# Patient Record
Sex: Male | Born: 1955 | Hispanic: No | Marital: Married | Smoking: Former smoker
Health system: Southern US, Community
[De-identification: ages and names within clinical notes are randomized; demographics above are authoritative.]

## PROBLEM LIST (undated history)

## (undated) DIAGNOSIS — I2510 Atherosclerotic heart disease of native coronary artery without angina pectoris: Secondary | ICD-10-CM

## (undated) DIAGNOSIS — E785 Hyperlipidemia, unspecified: Secondary | ICD-10-CM

## (undated) DIAGNOSIS — Z8616 Personal history of COVID-19: Secondary | ICD-10-CM

## (undated) DIAGNOSIS — R9431 Abnormal electrocardiogram [ECG] [EKG]: Secondary | ICD-10-CM

## (undated) DIAGNOSIS — E119 Type 2 diabetes mellitus without complications: Secondary | ICD-10-CM

## (undated) DIAGNOSIS — I10 Essential (primary) hypertension: Secondary | ICD-10-CM

## (undated) DIAGNOSIS — G4733 Obstructive sleep apnea (adult) (pediatric): Principal | ICD-10-CM

## (undated) HISTORY — DX: Hyperlipidemia, unspecified: E78.5

## (undated) HISTORY — DX: Type 2 diabetes mellitus without complications: E11.9

## (undated) HISTORY — DX: Personal history of COVID-19: Z86.16

## (undated) HISTORY — PX: CORONARY ANGIOPLASTY WITH STENT PLACEMENT: SHX49

## (undated) HISTORY — DX: Obstructive sleep apnea (adult) (pediatric): G47.33

## (undated) HISTORY — DX: Abnormal electrocardiogram (ECG) (EKG): R94.31

## (undated) HISTORY — DX: Atherosclerotic heart disease of native coronary artery without angina pectoris: I25.10

---

## 1995-04-28 HISTORY — PX: LEG SURGERY: SHX1003

## 2006-04-06 ENCOUNTER — Ambulatory Visit (HOSPITAL_COMMUNITY): Admission: RE | Admit: 2006-04-06 | Discharge: 2006-04-06 | Payer: Self-pay | Admitting: Cardiology

## 2007-08-20 ENCOUNTER — Emergency Department (HOSPITAL_COMMUNITY): Admission: EM | Admit: 2007-08-20 | Discharge: 2007-08-20 | Payer: Self-pay | Admitting: Emergency Medicine

## 2010-08-08 ENCOUNTER — Emergency Department (HOSPITAL_COMMUNITY): Payer: 59

## 2010-08-08 ENCOUNTER — Emergency Department (HOSPITAL_COMMUNITY)
Admission: EM | Admit: 2010-08-08 | Discharge: 2010-08-08 | Disposition: A | Discharge status: Home / Self Care | Payer: 59 | Attending: Emergency Medicine | Admitting: Emergency Medicine

## 2010-08-08 DIAGNOSIS — R0602 Shortness of breath: Secondary | ICD-10-CM | POA: Insufficient documentation

## 2010-08-08 DIAGNOSIS — R072 Precordial pain: Secondary | ICD-10-CM | POA: Insufficient documentation

## 2010-08-08 DIAGNOSIS — I517 Cardiomegaly: Secondary | ICD-10-CM | POA: Insufficient documentation

## 2010-08-08 DIAGNOSIS — R05 Cough: Secondary | ICD-10-CM | POA: Insufficient documentation

## 2010-08-08 DIAGNOSIS — R42 Dizziness and giddiness: Secondary | ICD-10-CM | POA: Insufficient documentation

## 2010-08-08 DIAGNOSIS — R059 Cough, unspecified: Secondary | ICD-10-CM | POA: Insufficient documentation

## 2010-08-08 LAB — DIFFERENTIAL
Basophils Absolute: 0 10*3/uL (ref 0.0–0.1)
Basophils Relative: 0 % (ref 0–1)
Eosinophils Absolute: 0.9 10*3/uL — ABNORMAL HIGH (ref 0.0–0.7)
Eosinophils Relative: 8 % — ABNORMAL HIGH (ref 0–5)
Lymphocytes Relative: 40 % (ref 12–46)
Lymphs Abs: 4.2 10*3/uL — ABNORMAL HIGH (ref 0.7–4.0)
Monocytes Absolute: 0.8 10*3/uL (ref 0.1–1.0)
Monocytes Relative: 7 % (ref 3–12)
Neutro Abs: 4.6 10*3/uL (ref 1.7–7.7)
Neutrophils Relative %: 44 % (ref 43–77)

## 2010-08-08 LAB — CBC
HCT: 38.7 % — ABNORMAL LOW (ref 39.0–52.0)
Hemoglobin: 13.4 g/dL (ref 13.0–17.0)
MCH: 27.1 pg (ref 26.0–34.0)
MCHC: 34.6 g/dL (ref 30.0–36.0)
MCV: 78.2 fL (ref 78.0–100.0)
Platelets: 163 10*3/uL (ref 150–400)
RBC: 4.95 MIL/uL (ref 4.22–5.81)
RDW: 13.1 % (ref 11.5–15.5)
WBC: 10.5 10*3/uL (ref 4.0–10.5)

## 2010-08-08 LAB — BASIC METABOLIC PANEL
BUN: 14 mg/dL (ref 6–23)
CO2: 32 mEq/L (ref 19–32)
Calcium: 9 mg/dL (ref 8.4–10.5)
Chloride: 96 mEq/L (ref 96–112)
Creatinine, Ser: 1.2 mg/dL (ref 0.4–1.5)
GFR calc Af Amer: >60 mL/min (ref >60)
GFR calc non Af Amer: >60 mL/min (ref >60)
Glucose, Bld: 132 mg/dL — ABNORMAL HIGH (ref 70–99)
Potassium: 2.3 mEq/L — CL (ref 3.5–5.1)
Sodium: 139 mEq/L (ref 135–145)

## 2010-08-08 LAB — POCT CARDIAC MARKERS
CKMB, poc: 3.3 ng/mL (ref 1.0–8.0)
CKMB, poc: 3.6 ng/mL (ref 1.0–8.0)
Myoglobin, poc: 119 ng/mL (ref 12–200)
Myoglobin, poc: 123 ng/mL (ref 12–200)
Troponin i, poc: <0.05 ng/mL (ref 0.00–0.09)
Troponin i, poc: <0.05 ng/mL (ref 0.00–0.09)

## 2010-08-08 LAB — D-DIMER, QUANTITATIVE: D-Dimer, Quant: 0.23 ug/mL-FEU (ref 0.00–0.48)

## 2010-08-12 ENCOUNTER — Other Ambulatory Visit: Payer: Self-pay | Admitting: Internal Medicine

## 2010-08-12 DIAGNOSIS — I10 Essential (primary) hypertension: Secondary | ICD-10-CM

## 2010-08-14 ENCOUNTER — Other Ambulatory Visit: Payer: Self-pay | Admitting: Internal Medicine

## 2010-08-14 DIAGNOSIS — I10 Essential (primary) hypertension: Secondary | ICD-10-CM

## 2010-08-19 ENCOUNTER — Ambulatory Visit
Admission: RE | Admit: 2010-08-19 | Discharge: 2010-08-19 | Disposition: A | Discharge status: Home / Self Care | Payer: 59 | Source: Ambulatory Visit | Attending: Internal Medicine | Admitting: Internal Medicine

## 2010-08-19 ENCOUNTER — Other Ambulatory Visit: Payer: Self-pay | Admitting: Internal Medicine

## 2010-08-19 DIAGNOSIS — I10 Essential (primary) hypertension: Secondary | ICD-10-CM

## 2010-08-25 ENCOUNTER — Other Ambulatory Visit: Payer: 59

## 2012-04-27 HISTORY — PX: COLONOSCOPY: SHX174

## 2012-08-23 ENCOUNTER — Emergency Department (HOSPITAL_COMMUNITY)
Admission: EM | Admit: 2012-08-23 | Discharge: 2012-08-23 | Disposition: A | Discharge status: Home / Self Care | Payer: BC Managed Care – PPO | Attending: Emergency Medicine | Admitting: Emergency Medicine

## 2012-08-23 ENCOUNTER — Encounter (HOSPITAL_COMMUNITY): Payer: Self-pay | Admitting: Nurse Practitioner

## 2012-08-23 ENCOUNTER — Emergency Department (HOSPITAL_COMMUNITY): Payer: BC Managed Care – PPO

## 2012-08-23 DIAGNOSIS — R059 Cough, unspecified: Secondary | ICD-10-CM

## 2012-08-23 DIAGNOSIS — R079 Chest pain, unspecified: Secondary | ICD-10-CM | POA: Insufficient documentation

## 2012-08-23 DIAGNOSIS — R0602 Shortness of breath: Secondary | ICD-10-CM | POA: Insufficient documentation

## 2012-08-23 DIAGNOSIS — I10 Essential (primary) hypertension: Secondary | ICD-10-CM | POA: Insufficient documentation

## 2012-08-23 DIAGNOSIS — Z79899 Other long term (current) drug therapy: Secondary | ICD-10-CM | POA: Insufficient documentation

## 2012-08-23 DIAGNOSIS — R05 Cough: Secondary | ICD-10-CM | POA: Insufficient documentation

## 2012-08-23 HISTORY — DX: Essential (primary) hypertension: I10

## 2012-08-23 MED ORDER — OMEPRAZOLE 20 MG PO CPDR: 20.0000 mg | DELAYED_RELEASE_CAPSULE | Freq: Every day | ORAL | Status: DC

## 2012-08-23 MED ORDER — ALBUTEROL SULFATE HFA 108 (90 BASE) MCG/ACT IN AERS
2.0000 | INHALATION_SPRAY | RESPIRATORY_TRACT | Status: DC | PRN
  Administered 2012-08-23: 2 via RESPIRATORY_TRACT
  Filled 2012-08-23: qty 6.7

## 2012-08-23 NOTE — ED Provider Notes (Signed)
History    This chart was scribed for non-physician practitioner Elpidio Anis, PA-C working with Dione Booze, MD by Gerlean Ren, ED Scribe. This patient was seen in room TR06C/TR06C and the patient's care was started at 5:51 PM.   CSN: [id number]  Arrival date & time 08/23/12  1509   First MD Initiated Contact with Patient 08/23/12 1741      Chief Complaint  Patient presents with  . Cough     The history is provided by the patient. No language interpreter was used.  Thomas Rowland is a 57 y.o. male who presents to the Emergency Department complaining of 3-4 weeks of constant cough productive of white/brown sputum that is worse at night.  Pt reports associated shortness of breath, and left lateral rib pain only present when coughing.  Pt has several regular medications and denies any changes in medication prior to symptom onset.  Pt has reported this problem to his PCP and has been told the cough is related to HTN.   PCP is Dr. Allyne Gee. Past Medical History  Diagnosis Date  . Hypertension     History reviewed. No pertinent past surgical history.  History reviewed. No pertinent family history.  History  Substance Use Topics  . Smoking status: Never Smoker   . Smokeless tobacco: Not on file  . Alcohol Use: Yes      Review of Systems  Constitutional: Negative for fever.  Respiratory: Positive for cough and shortness of breath. Negative for wheezing.   Cardiovascular: Negative for chest pain.  All other systems reviewed and are negative.    Allergies  Review of patient's allergies indicates no known allergies.  Home Medications   Current Outpatient Rx  Name  Route  Sig  Dispense  Refill  . amLODipine-olmesartan (AZOR) 10-40 MG per tablet   Oral   Take 1 tablet by mouth daily.         . carvedilol (COREG) 25 MG tablet   Oral   Take 37.5 mg by mouth 2 (two) times daily with a meal.         . cloNIDine (CATAPRES) 0.2 MG tablet   Oral   Take 0.2 mg by mouth 2  (two) times daily.         . furosemide (LASIX) 40 MG tablet   Oral   Take 40 mg by mouth daily.         . hydrALAZINE (APRESOLINE) 25 MG tablet   Oral   Take 25 mg by mouth 2 (two) times daily.         . methyldopa (ALDOMET) 250 MG tablet   Oral   Take 250 mg by mouth 2 (two) times daily.         . potassium chloride (KLOR-CON) 20 MEQ packet   Oral   Take 40 mEq by mouth 2 (two) times daily.         . Vitamin D, Ergocalciferol, (DRISDOL) 50000 UNITS CAPS   Oral   Take 50,000 Units by mouth 2 (two) times a week. Tues & Fri           BP 168/90  Pulse 75  Temp(Src) 98.8 F (37.1 C) (Oral)  Resp 18  SpO2 97%  Physical Exam  Nursing note and vitals reviewed. Constitutional: He is oriented to person, place, and time. He appears well-developed and well-nourished. No distress.  HENT:  Head: Normocephalic and atraumatic.  Eyes: EOM are normal.  Neck: Neck supple. No tracheal deviation present.  Cardiovascular:  Normal rate, regular rhythm and normal heart sounds.   Pulmonary/Chest: Effort normal and breath sounds normal. No respiratory distress. He has no wheezes. He has no rales.  Musculoskeletal: Normal range of motion.  Neurological: He is alert and oriented to person, place, and time.  Skin: Skin is warm and dry.  Psychiatric: He has a normal mood and affect. His behavior is normal.    ED Course  Procedures (including critical care time) DIAGNOSTIC STUDIES: Oxygen Saturation is 97% on room air, adequate by my interpretation.    COORDINATION OF CARE: 5:57 PM- Informed pt that it is not unusual for HTN medication to cause a cough.  Informed pt that because XR does not show pneumonia and because pt has not had any further symptoms this cough may very well be related to HTN medication.  Informed pt I will further assess medical records.  Pt verbalizes understanding and agrees with plan.     Dg Chest 2 View  08/23/2012  *RADIOLOGY REPORT*  Clinical Data:  Chronic cough  CHEST - 2 VIEW  Comparison: 08/08/2010 and 08/20/2007 chest radiographs  Findings: Mild cardiomegaly is noted. Elevation of the right hemidiaphragm is unchanged. There is no evidence of focal airspace disease, pulmonary edema, suspicious pulmonary nodule/mass, pleural effusion, or pneumothorax. No acute bony abnormalities are identified.  IMPRESSION: Cardiomegaly without evidence of acute cardiopulmonary disease.   Original Report Authenticated By: Harmon Pier, M.D.      No diagnosis found. 1. Cough    MDM  Patient's medications reviewed and none found to be implicated as cause of cough. Patient given an Albuterol inhaler as well as recommendation for Prilosec, and encouraged to follow up with Dr. Allyne Gee.      I personally performed the services described in this documentation, which was scribed in my presence. The recorded information has been reviewed and is accurate.    Arnoldo Hooker, PA-C 08/23/12 1817

## 2012-08-23 NOTE — ED Notes (Signed)
Pt reports productive cough and nasal congestion for past 3 weeks. Denies fevers. A&Ox4, able to speak in full unlabored sentences.

## 2012-08-23 NOTE — ED Provider Notes (Signed)
Medical screening examination/treatment/procedure(s) were performed by non-physician practitioner and as supervising physician I was immediately available for consultation/collaboration.   Dione Booze, MD 08/23/12 2152

## 2013-02-03 LAB — HM COLONOSCOPY

## 2013-06-26 ENCOUNTER — Encounter: Payer: Self-pay | Admitting: Neurology

## 2013-06-26 ENCOUNTER — Encounter (INDEPENDENT_AMBULATORY_CARE_PROVIDER_SITE_OTHER): Payer: Self-pay

## 2013-06-26 ENCOUNTER — Ambulatory Visit (INDEPENDENT_AMBULATORY_CARE_PROVIDER_SITE_OTHER): Payer: No Typology Code available for payment source | Admitting: Neurology

## 2013-06-26 VITALS — BP 151/103 | HR 85 | Temp 98.3°F | Ht 67.0 in | Wt 188.0 lb

## 2013-06-26 DIAGNOSIS — R9431 Abnormal electrocardiogram [ECG] [EKG]: Secondary | ICD-10-CM

## 2013-06-26 DIAGNOSIS — I10 Essential (primary) hypertension: Secondary | ICD-10-CM | POA: Insufficient documentation

## 2013-06-26 DIAGNOSIS — G4733 Obstructive sleep apnea (adult) (pediatric): Secondary | ICD-10-CM | POA: Insufficient documentation

## 2013-06-26 HISTORY — DX: Abnormal electrocardiogram (ECG) (EKG): R94.31

## 2013-06-26 NOTE — Patient Instructions (Signed)

## 2013-06-26 NOTE — Progress Notes (Signed)
Subjective:    Patient ID: Thomas Rowland is a 58 y.o. male.  HPI    [name], MD, PhD [facility] [address] [address] [place], [place] [address]  Dear Thomas Rowland,   I saw your patient, Thomas Rowland, upon your kind request in my neurologic clinic today for initial consultation of his sleep disorder, in particular, concern for obstructive sleep apnea. The patient is accompanied by his daughter, Thomas Rowland, today, who also helps with some of the translation, although he speaks [place] well enough. As you know, Thomas Rowland is a very pleasant 58 year old right-handed gentleman with an underlying medical history of difficult to control hypertension, abnormal EKG, and chronic renal insufficiency, who reports loud snoring, daytime somnolence and daytime lack of energy. However, his ESS is 1/24 today. He cannot readily go back to sleep even when feeling sleepy or tired at night. He achieves about 4 hours of sleep per night. He works from 3 to 11 PM; he works at [employer] and [employer].   His typical bedtime is reported to be around 2 AM and usual wake time is around 7 AM. Sleep onset typically occurs within a few minutes. He reports feeling poorly rested upon awakening. He wakes up on an average 3 times in the middle of the night and has to go to the bathroom at least 2 times on a typical night. He denies morning headaches. The patient has been taking a planned nap, which is usually 1 hour long, sometime from 11 AM to 12. He reports feeling refreshed after a nap.  He has been known to snore for the past many years. Snoring is reportedly marked, and associated with choking sounds and witnessed apneas. The patient denies a sense of choking or strangling feeling. There is no report of nighttime reflux, with frequent nighttime cough experienced. The patient has not noted any RLS symptoms and is not known to kick while asleep or before falling asleep. There is no family history of  RLS or OSA.   He is not a very restless sleeper.   He denies cataplexy, sleep paralysis, hypnagogic or hypnopompic hallucinations, or sleep attacks. He does not report any vivid dreams, nightmares, dream enactments, or parasomnias, such as sleep talking or sleep walking. The patient has not had a sleep study or a home sleep test.  He consumes 1 caffeinated beverages per day, usually in the form of tea.  His bedroom is usually dark and cool. There is TV in the bedroom and usually it is not on at night.   His Past Medical History Is Significant For: Past Medical History  Diagnosis Date  . Hypertension   . OSA (obstructive sleep apnea) 06/26/2013  . Nonspecific abnormal electrocardiogram (ECG) (EKG) 06/26/2013    His Past Surgical History Is Significant For: Past Surgical History  Procedure Laterality Date  . [place] surgery Left 1997    [place]  . Colonoscopy  2014    Normal    His Family History Is Significant For: Family History  Problem Relation Age of Onset  . Hypertension Sister     His Social History Is Significant For: History   Social History  . Marital Status: Married    Spouse Name: N/A    Number of Children: 3  . Years of Education: N/A   Occupational History  .     Social History Main Topics  . Smoking status: Former Smoker    Quit date: 06/26/2001  . Smokeless tobacco: None  .  Alcohol Use: Yes  . Drug Use: No  . Sexual Activity: None   Other Topics Concern  . None   Social History Narrative  . None    His Allergies Are:  No Known Allergies:   His Current Medications Are:  Outpatient Encounter Prescriptions as of 06/26/2013  Medication Sig  . amLODipine (NORVASC) 5 MG tablet Take 2 tablets by mouth daily.  . chlorthalidone (HYGROTON) 25 MG tablet Take 1 tablet by mouth daily.  . dorzolamide-timolol (COSOPT) 22.3-6.8 MG/ML ophthalmic solution Place 2 drops into both eyes daily.  Marland Kitchen KLOR-CON M20 20 MEQ tablet Take 2 tablets by mouth daily.  Marland Kitchen latanoprost  (XALATAN) 0.005 % ophthalmic solution Place 1 drop into both eyes daily.  Marland Kitchen lisinopril (PRINIVIL,ZESTRIL) 40 MG tablet Take 1 tablet by mouth daily.  . methyldopa (ALDOMET) 250 MG tablet Take 250 mg by mouth 2 (two) times daily.  . metoprolol succinate (TOPROL-XL) 50 MG 24 hr tablet Take 1 tablet by mouth daily.  . [DISCONTINUED] amLODipine-olmesartan (AZOR) 10-40 MG per tablet Take 1 tablet by mouth daily.  . [DISCONTINUED] carvedilol (COREG) 25 MG tablet Take 37.5 mg by mouth 2 (two) times daily with a meal.  . [DISCONTINUED] cloNIDine (CATAPRES) 0.2 MG tablet Take 0.2 mg by mouth 2 (two) times daily.  . [DISCONTINUED] furosemide (LASIX) 40 MG tablet Take 40 mg by mouth daily.  . [DISCONTINUED] hydrALAZINE (APRESOLINE) 25 MG tablet Take 25 mg by mouth 2 (two) times daily.  . [DISCONTINUED] methyldopa (ALDOMET) 250 MG tablet Take 250 mg by mouth 2 (two) times daily.  . [DISCONTINUED] omeprazole (PRILOSEC) 20 MG capsule Take 1 capsule (20 mg total) by mouth daily.  . [DISCONTINUED] potassium chloride (KLOR-CON) 20 MEQ packet Take 40 mEq by mouth 2 (two) times daily.  . [DISCONTINUED] Vitamin D, Ergocalciferol, (DRISDOL) 50000 UNITS CAPS Take 50,000 Units by mouth 2 (two) times a week. Tues & Fri  :  Review of Systems:  Out of a complete 14 point review of systems, all are reviewed and negative with the exception of these symptoms as listed below:   Review of Systems  Constitutional: Negative.   HENT: Negative.   Eyes: Negative.   Respiratory: Negative.   Cardiovascular: Negative.   Gastrointestinal: Negative.   Endocrine: Negative.   Genitourinary: Negative.   Musculoskeletal: Negative.   Skin: Negative.   Allergic/Immunologic: Negative.   Neurological: Negative.   Hematological: Negative.   Psychiatric/Behavioral: Positive for sleep disturbance (snoring).    Objective:  Neurologic Exam  Physical Exam Physical Examination:   Filed Vitals:   06/26/13 1033  BP: 151/103   Pulse: 85  Temp: 98.3 F (36.8 C)    General Examination: The patient is a very pleasant 58 y.o. male in no acute distress. He appears well-developed and well-nourished and well groomed.   HEENT: Normocephalic, atraumatic, pupils are equal, round and reactive to light and accommodation. Funduscopic exam is normal with sharp disc margins noted. Extraocular tracking is good without limitation to gaze excursion or nystagmus noted. Normal smooth pursuit is noted. Hearing is grossly intact. Tympanic membranes are clear bilaterally. Face is symmetric with normal facial animation and normal facial sensation. Speech is clear with no dysarthria noted. He has a heavy [place] accent. There is no hypophonia. There is no lip, neck/head, jaw or voice tremor. Neck is supple with full range of passive and active motion. There are no carotid bruits on auscultation. Oropharynx exam reveals: mild mouth dryness, adequate dental hygiene and marked airway crowding, due  to large tongue, narrow airway entry and tonsils in place, uvula is thicker. Mallampati is class II. Tongue protrudes centrally and palate elevates symmetrically. Tonsils are 1+. Neck size is 17 inches.   Chest: Clear to auscultation without wheezing, rhonchi or crackles noted.  Heart: S1+S2+0, regular and normal without murmurs, rubs or gallops noted.   Abdomen: Soft, non-tender and non-distended with normal bowel sounds appreciated on auscultation.  Extremities: There is no pitting edema in the distal lower extremities bilaterally. Pedal pulses are intact.  Skin: Warm and dry without trophic changes noted. There are no varicose veins.  Musculoskeletal: exam reveals no obvious joint deformities, tenderness or joint swelling or erythema.   Neurologically:  Mental status: The patient is awake, alert and oriented in all 4 spheres. His immediate and remote memory, attention, language skills and fund of knowledge are appropriate. There is no evidence  of aphasia, agnosia, apraxia or anomia. Speech is clear with normal prosody and enunciation. Thought process is linear. Mood is normal and affect is normal.  Cranial nerves II - XII are as described above under HEENT exam. In addition: shoulder shrug is normal with equal shoulder height noted. Motor exam: Normal bulk, strength and tone is noted. There is no drift, tremor or rebound. Romberg is negative. Reflexes are 2+ throughout. Babinski: Toes are flexor bilaterally. Fine motor skills and coordination: intact with normal finger taps, normal hand movements, normal rapid alternating patting, normal foot taps and normal foot agility.  Cerebellar testing: No dysmetria or intention tremor on finger to nose testing. Heel to shin is unremarkable bilaterally. There is no truncal or gait ataxia.  Sensory exam: intact to light touch, pinprick, vibration, temperature sense and proprioception in the upper and lower extremities.  Gait, station and balance: He stands easily. No veering to one side is noted. No leaning to one side is noted. Posture is age-appropriate and stance is narrow based. Gait shows normal stride length and normal pace. No problems turning are noted. He turns en bloc. Tandem walk is difficult for him, d/t prior L leg injury. Intact toe and heel stance is noted.               Assessment and Plan:   In summary, Thomas Rowland is a very pleasant 58 y.o.-year old male with a history and physical exam concerning for obstructive sleep apnea (OSA). His difficult to control HTN may be indeed related to underlying OSA.  I had a long chat with the patient and his daughter about my findings and the diagnosis of OSA, its prognosis and treatment options. We talked about medical treatments and non-pharmacological approaches. I explained in particular the risks and ramifications of untreated moderate to severe OSA, especially with respect to developing cardiovascular disease down the Road, including congestive  heart failure, difficult to treat hypertension, cardiac arrhythmias, or stroke. Even type 2 diabetes has in part been linked to untreated OSA. We talked about a healthy lifestyle in general, as well as the importance of weight control. I encouraged the patient to eat healthy, exercise daily and keep well hydrated, to keep a scheduled bedtime and wake time routine, to not skip any meals and eat healthy snacks in between meals.  I recommended the following at this time: sleep study with potential positive airway pressure titration.  I explained the sleep test procedure to the patient and also outlined possible surgical and non-surgical treatment options of OSA, including the use of a custom-made dental device, upper airway surgical options, such as  pillar implants, radiofrequency surgery, tongue base surgery, and UPPP. I also explained the CPAP treatment option to the patient, who indicated that he would be willing to try CPAP if the need arises. I explained the importance of being compliant with PAP treatment, not only for insurance purposes but primarily to improve His symptoms, and for the patient's long term health benefit, including to reduce His cardiovascular risks. I answered all their questions today and the patient and his daughter were in agreement. I would like to see him back after the sleep study is completed and encouraged them to call with any interim questions, concerns, problems or updates.   Thank you very much for allowing me to participate in the care of this nice patient. If I can be of any further assistance to you please do not hesitate to call me at [phone].  Sincerely,   [name], MD, PhD

## 2013-07-03 ENCOUNTER — Telehealth: Payer: Self-pay | Admitting: Neurology

## 2013-07-03 NOTE — Telephone Encounter (Signed)
Verified pt's benefits for his sleep study and was told by his plan that we would be considered Tier 2 because his card is with [facility].  Tier 2 would mean a higher out of pocket expense.  Told pt that his records would be forwarded to a provider of his choice.  Will send notification to the referring provider.

## 2013-07-20 ENCOUNTER — Other Ambulatory Visit: Payer: Self-pay | Admitting: Cardiology

## 2013-07-20 DIAGNOSIS — E261 Secondary hyperaldosteronism: Secondary | ICD-10-CM

## 2013-07-27 ENCOUNTER — Ambulatory Visit
Admission: RE | Admit: 2013-07-27 | Discharge: 2013-07-27 | Disposition: A | Discharge status: Home / Self Care | Payer: No Typology Code available for payment source | Source: Ambulatory Visit | Attending: Cardiology | Admitting: Cardiology

## 2013-07-27 DIAGNOSIS — E261 Secondary hyperaldosteronism: Secondary | ICD-10-CM

## 2015-10-23 ENCOUNTER — Encounter (HOSPITAL_COMMUNITY): Payer: Self-pay | Admitting: *Deleted

## 2015-10-23 ENCOUNTER — Emergency Department (HOSPITAL_COMMUNITY): Payer: BLUE CROSS/BLUE SHIELD

## 2015-10-23 DIAGNOSIS — Z87891 Personal history of nicotine dependence: Secondary | ICD-10-CM | POA: Insufficient documentation

## 2015-10-23 DIAGNOSIS — I10 Essential (primary) hypertension: Secondary | ICD-10-CM | POA: Diagnosis not present

## 2015-10-23 DIAGNOSIS — M25572 Pain in left ankle and joints of left foot: Secondary | ICD-10-CM | POA: Insufficient documentation

## 2015-10-23 DIAGNOSIS — Z79899 Other long term (current) drug therapy: Secondary | ICD-10-CM | POA: Insufficient documentation

## 2015-10-23 DIAGNOSIS — M79671 Pain in right foot: Secondary | ICD-10-CM | POA: Diagnosis present

## 2015-10-23 NOTE — ED Notes (Signed)
The pt is c/o lt foot pain since this am.  No known injury

## 2015-10-23 NOTE — ED Notes (Addendum)
The pts acuity changed by member of staff that has not seen or triaged this pt acuity replaced as initial triaged

## 2015-10-24 ENCOUNTER — Emergency Department (HOSPITAL_COMMUNITY)
Admission: EM | Admit: 2015-10-24 | Discharge: 2015-10-24 | Disposition: A | Discharge status: Home / Self Care | Payer: BLUE CROSS/BLUE SHIELD | Attending: Emergency Medicine | Admitting: Emergency Medicine

## 2015-10-24 DIAGNOSIS — M25572 Pain in left ankle and joints of left foot: Secondary | ICD-10-CM

## 2015-10-24 MED ORDER — TRAMADOL HCL 50 MG PO TABS
50.0000 mg | ORAL_TABLET | Freq: Once | ORAL | Status: AC
  Administered 2015-10-24: 50 mg via ORAL
  Filled 2015-10-24: qty 1

## 2015-10-24 MED ORDER — IBUPROFEN 600 MG PO TABS: 600.0000 mg | ORAL_TABLET | Freq: Four times a day (QID) | ORAL | Status: DC | PRN

## 2015-10-24 MED ORDER — DEXAMETHASONE SODIUM PHOSPHATE 10 MG/ML IJ SOLN
10.0000 mg | Freq: Once | INTRAMUSCULAR | Status: AC
  Administered 2015-10-24: 10 mg via INTRAMUSCULAR
  Filled 2015-10-24: qty 1

## 2015-10-24 NOTE — ED Provider Notes (Signed)
CSN: [id number]     Arrival date & time 10/23/15  2306 History  By signing my name below, I, Thomas Rowland, attest that this documentation has been prepared under the direction and in the presence of Tomasita CrumbleAdeleke Chanetta Moosman, MD. Electronically Signed: Bethel BornBritney Rowland, ED Scribe. 10/24/2015. 1:38 AM     Chief Complaint  Patient presents with  . Foot Pain   The history is provided by the patient. No language interpreter was used.   Thomas Rowland is a 60 y.o. male who presents to the Emergency Department complaining of new, constant, 10/10 in severity, lateral right foot pain with onset yesterday. Pt denies any trauma to the foot. He took nothing for pain at home. He has no history of DM.   Past Medical History  Diagnosis Date  . Hypertension   . OSA (obstructive sleep apnea) 06/26/2013  . Nonspecific abnormal electrocardiogram (ECG) (EKG) 06/26/2013   Past Surgical History  Procedure Laterality Date  . [place] 1997    [place]  . Colonoscopy  2014    Normal   Family History  Problem Relation Age of Onset  . Hypertension Sister    Social History  Substance Use Topics  . Smoking status: Former Smoker    Quit date: 06/26/2001  . Smokeless tobacco: None  . Alcohol Use: Yes    Review of Systems  10 Systems reviewed and all are negative for acute change except as noted in the HPI.  Allergies  Review of patient's allergies indicates no known allergies.  Home Medications   Prior to Admission medications   Medication Sig Start Date End Date Taking? Authorizing Provider  amLODipine (NORVASC) 5 MG tablet Take 2 tablets by mouth daily. 06/24/13   Historical Provider, MD  chlorthalidone (HYGROTON) 25 MG tablet Take 1 tablet by mouth daily. 06/24/13   Historical Provider, MD  dorzolamide-timolol (COSOPT) 22.3-6.8 MG/ML ophthalmic solution Place 2 drops into both eyes daily. 05/06/13   Historical Provider, MD  KLOR-CON M20 20 MEQ tablet Take 2 tablets by mouth daily. 06/24/13   Historical  Provider, MD  latanoprost (XALATAN) 0.005 % ophthalmic solution Place 1 drop into both eyes daily. 04/30/13   Historical Provider, MD  lisinopril (PRINIVIL,ZESTRIL) 40 MG tablet Take 1 tablet by mouth daily. 06/24/13   Historical Provider, MD  methyldopa (ALDOMET) 250 MG tablet Take 250 mg by mouth 2 (two) times daily.    Historical Provider, MD  metoprolol succinate (TOPROL-XL) 50 MG 24 hr tablet Take 1 tablet by mouth daily. 06/24/13   Historical Provider, MD   BP 92/62 mmHg  Pulse 69  Temp(Src) 98.7 F (37.1 C) (Oral)  Resp 20  Ht 5\' 6"  (1.676 m)  Wt 191 lb 1 oz (86.665 kg)  BMI 30.85 kg/m2  SpO2 97% Physical Exam  Constitutional: He is oriented to person, place, and time. Vital signs are normal. He appears well-developed and well-nourished.  Non-toxic appearance. He does not appear ill. No distress.  HENT:  Head: Normocephalic and atraumatic.  Nose: Nose normal.  Mouth/Throat: Oropharynx is clear and moist. No oropharyngeal exudate.  Eyes: Conjunctivae and EOM are normal. Pupils are equal, round, and reactive to light. No scleral icterus.  Neck: Normal range of motion. Neck supple. No tracheal deviation, no edema, no erythema and normal range of motion present. No thyroid mass and no thyromegaly present.  Cardiovascular: Normal rate, regular rhythm, S1 normal, S2 normal, normal heart sounds, intact distal pulses and normal pulses.  Exam reveals no gallop and no friction  rub.   No murmur heard. Pulmonary/Chest: Effort normal and breath sounds normal. No respiratory distress. He has no wheezes. He has no rhonchi. He has no rales.  Abdominal: Soft. Normal appearance and bowel sounds are normal. He exhibits no distension, no ascites and no mass. There is no hepatosplenomegaly. There is no tenderness. There is no rebound, no guarding and no CVA tenderness.  Musculoskeletal: Normal range of motion. He exhibits no edema or tenderness.  Left foot with normal ROM, normal sensation, normal pulses,  and no tenderness  Lymphadenopathy:    He has no cervical adenopathy.  Neurological: He is alert and oriented to person, place, and time. He has normal strength. No cranial nerve deficit or sensory deficit.  Skin: Skin is warm, dry and intact. No petechiae and no rash noted. He is not diaphoretic. No erythema. No pallor.  Psychiatric: He has a normal mood and affect. His behavior is normal. Judgment normal.  Nursing note and vitals reviewed.   ED Course  Procedures (including critical care time) DIAGNOSTIC STUDIES: Oxygen Saturation is 97% on RA,  normal by my interpretation.    COORDINATION OF CARE: 1:40 AM Discussed treatment plan which includes right foot XR and pain medication with pt at bedside and pt agreed to plan.  Labs Review Labs Reviewed - No data to display  Imaging Review Dg Foot Complete Left  10/23/2015  CLINICAL DATA:  Nontraumatic lateral foot pain, onset this morning. EXAM: LEFT FOOT - COMPLETE 3+ VIEW COMPARISON:  None. FINDINGS: There is no evidence of fracture or dislocation. There is no evidence of arthropathy or other focal bone abnormality. Soft tissues are unremarkable. IMPRESSION: Negative. Electronically Signed   By: Ellery Plunkaniel R Mitchell M.D.   On: 10/23/2015 23:41   I have personally reviewed and evaluated these images as part of my medical decision-making.   EKG Interpretation None      MDM   Final diagnoses:  None   Patient presents to the ED for foot and ankle pain with no history of trauma or diabetes.  Xray neg for cuase of pain.  He was given toradol and tramadol in the ED.  Advised on RICE techniques at home with tylenol and ibuprofen.  PCP fu advised as well. He appears well and in NAD. VS remain within his normal limits and he is safe for DC.    I personally performed the services described in this documentation, which was scribed in my presence. The recorded information has been reviewed and is accurate.     Tomasita CrumbleAdeleke Olie Scaffidi, MD 10/24/15  [phone]

## 2015-10-24 NOTE — Discharge Instructions (Signed)
Joint Pain Thomas Rowland, take tylenol 1000mg  every 8 hours, or ibuprofen 600mg  every 6 hours as needed for your pain. See a primary care doctor within 3 days for close follow up of your pain. If symptoms worsen, come back to the ED immediately. Thank you. Joint pain can be caused by many things. The joint can be bruised, infected, weak from aging, or sore from exercise. The pain will probably go away if you follow your doctor's instructions for home care. If your joint pain continues, more tests may be needed to help find the cause of your condition. HOME CARE Watch your condition for any changes. Follow these instructions as told to lessen the pain that you are feeling:  Take medicines only as told by your doctor.  Rest the sore joint for as long as told by your doctor. If your doctor tells you to, raise (elevate) the painful joint above the level of your heart while you are sitting or lying down.  Do not do things that cause pain or make the pain worse.  If told, put ice on the painful area:  Put ice in a plastic bag.  Place a towel between your skin and the bag.  Leave the ice on for 20 minutes, 2-3 times per day.  Wear an elastic bandage, splint, or sling as told by your doctor. Loosen the bandage or splint if your fingers or toes lose feeling (become numb) and tingle, or if they turn cold and blue.  Begin exercising or stretching the joint as told by your doctor. Ask your doctor what types of exercise are safe for you.  Keep all follow-up visits as told by your doctor. This is important. GET HELP IF:  Your pain gets worse and medicine does not help it.  Your joint pain does not get better in 3 days.  You have more bruising or swelling.  You have a fever.  You lose 10 pounds (4.5 kg) or more without trying. GET HELP RIGHT AWAY IF:  You are not able to move the joint.  Your fingers or toes become numb or they turn cold and blue.   This information is not intended to  replace advice given to you by your health care provider. Make sure you discuss any questions you have with your health care provider.   Document Released: 04/01/2009 Document Revised: 05/04/2014 Document Reviewed: 01/23/2014 Elsevier Interactive Patient Education [employer]2016 Elsevier Inc.

## 2017-04-09 ENCOUNTER — Encounter: Payer: Self-pay | Admitting: Podiatry

## 2017-04-09 ENCOUNTER — Ambulatory Visit (INDEPENDENT_AMBULATORY_CARE_PROVIDER_SITE_OTHER): Payer: BLUE CROSS/BLUE SHIELD | Admitting: Podiatry

## 2017-04-09 DIAGNOSIS — Q828 Other specified congenital malformations of skin: Secondary | ICD-10-CM

## 2017-04-09 NOTE — Progress Notes (Signed)
  Subjective:  Patient ID: Thomas Rowland, male    DOB: March 27, 1956,  MRN: [id number]  Chief Complaint  Patient presents with  . Foot Pain    Patient c/o multiple callused areas plantar left foot and between 4th and 5th toes bilateral (L>R), tried creams-no help   61 y.o. male presents with the above complaint.  Ports multiple painful calluses to the left foot.  Has tried over-the-counter creams without help.  Denies other issues Past Medical History:  Diagnosis Date  . Hypertension   . Nonspecific abnormal electrocardiogram (ECG) (EKG) 06/26/2013  . OSA (obstructive sleep apnea) 06/26/2013   Past Surgical History:  Procedure Laterality Date  . COLONOSCOPY  2014   Normal  . LEG SURGERY Left 1997   [place]    Current Outpatient Medications:  .  amLODipine (NORVASC) 10 MG tablet, Take 10 mg by mouth daily., Disp: , Rfl: 0 .  atorvastatin (LIPITOR) 40 MG tablet, , Disp: , Rfl: 0 .  chlorthalidone (HYGROTON) 25 MG tablet, Take 1 tablet by mouth daily., Disp: , Rfl:  .  dorzolamide-timolol (COSOPT) 22.3-6.8 MG/ML ophthalmic solution, Place 2 drops into both eyes daily., Disp: , Rfl:  .  eplerenone (INSPRA) 50 MG tablet, , Disp: , Rfl: 4 .  FLUZONE HIGH-DOSE 0.5 ML injection, INJECT INTRAMUSCUALRY ONCE, Disp: , Rfl: 0 .  ibuprofen (ADVIL,MOTRIN) 600 MG tablet, Take 1 tablet (600 mg total) by mouth every 6 (six) hours as needed for moderate pain., Disp: 15 tablet, Rfl: 0 .  KLOR-CON M20 20 MEQ tablet, Take 2 tablets by mouth daily., Disp: , Rfl:  .  latanoprost (XALATAN) 0.005 % ophthalmic solution, Place 1 drop into both eyes daily., Disp: , Rfl:  .  lisinopril (PRINIVIL,ZESTRIL) 40 MG tablet, Take 1 tablet by mouth daily., Disp: , Rfl:  .  methyldopa (ALDOMET) 250 MG tablet, Take 250 mg by mouth 2 (two) times daily., Disp: , Rfl:  .  metoprolol succinate (TOPROL-XL) 50 MG 24 hr tablet, Take 1 tablet by mouth daily., Disp: , Rfl:  .  PREVNAR 13 SUSP injection, INJECT ONCE, Disp: , Rfl: 0 .   sildenafil (REVATIO) 20 MG tablet, take 1 tablet by oral route every day as needed., Disp: , Rfl: 3  No Known Allergies Review of Systems  All other systems reviewed and are negative.  Objective:  There were no vitals filed for this visit. General AA&O x3. Normal mood and affect.  Vascular Dorsalis pedis and posterior tibial pulses  present 2+ bilaterally  Capillary refill normal to all digits. Pedal hair growth normal.  Neurologic Epicritic sensation grossly present.  Dermatologic Multiple hyperkeratotic lesions to the plantar foot left along the central aspect of the arch, heel fifth MPJ, central fat pad, between left fourth fifth toes bilateral Interspaces clear of maceration. Nails well groomed and normal in appearance.  Orthopedic: MMT 5/5 in dorsiflexion, plantarflexion, inversion, and eversion. Normal joint ROM without pain or crepitus.   Assessment & Plan:  Patient was evaluated and treated and all questions answered.  Porokeratoses -Discussed with patient that some of the lesions were due to underlying bony prominences however others were likely to be blocked sweat duct.  Lesions palliatively debrided.  Educated patient on self-care.  Continue over-the-counter creams.  Advised to use urea cream.  Procedure: Callus Debridement Rationale: Full porokeratosis Type of Debridement: sharp Instrumentation: 15 blade Number of Calluses: 5   No Follow-up on file.

## 2017-12-29 DIAGNOSIS — R7309 Other abnormal glucose: Secondary | ICD-10-CM

## 2017-12-29 DIAGNOSIS — I1310 Hypertensive heart and chronic kidney disease without heart failure, with stage 1 through stage 4 chronic kidney disease, or unspecified chronic kidney disease: Secondary | ICD-10-CM

## 2017-12-29 DIAGNOSIS — E269 Hyperaldosteronism, unspecified: Secondary | ICD-10-CM

## 2017-12-29 DIAGNOSIS — I519 Heart disease, unspecified: Secondary | ICD-10-CM

## 2017-12-29 DIAGNOSIS — Z79899 Other long term (current) drug therapy: Secondary | ICD-10-CM

## 2017-12-29 DIAGNOSIS — N183 Chronic kidney disease, stage 3 (moderate): Secondary | ICD-10-CM

## 2018-02-03 ENCOUNTER — Telehealth: Payer: Self-pay | Admitting: Internal Medicine

## 2018-02-08 NOTE — Telephone Encounter (Signed)
Open in error

## 2018-04-15 ENCOUNTER — Other Ambulatory Visit: Payer: Self-pay | Admitting: Internal Medicine

## 2018-05-11 ENCOUNTER — Ambulatory Visit (HOSPITAL_COMMUNITY)
Admission: EM | Admit: 2018-05-11 | Discharge: 2018-05-11 | Disposition: A | Discharge status: Home / Self Care | Payer: BLUE CROSS/BLUE SHIELD | Attending: Family Medicine | Admitting: Family Medicine

## 2018-05-11 ENCOUNTER — Encounter (HOSPITAL_COMMUNITY): Payer: Self-pay | Admitting: Family Medicine

## 2018-05-11 ENCOUNTER — Ambulatory Visit: Payer: Self-pay | Admitting: Internal Medicine

## 2018-05-11 DIAGNOSIS — I208 Other forms of angina pectoris: Secondary | ICD-10-CM | POA: Diagnosis not present

## 2018-05-11 MED ORDER — BENZONATATE 100 MG PO CAPS: 100.0000 mg | ORAL_CAPSULE | Freq: Three times a day (TID) | ORAL | 0 refills | Status: DC | PRN

## 2018-05-11 NOTE — Discharge Instructions (Addendum)
Continue taking your baby aspirin every day  Please call the cardiologist noted below for an appointment tomorrow.  If you cannot get an appointment, go to the emergency room for further evaluation.  I have called in a prescription to help you with your cough.

## 2018-05-11 NOTE — ED Triage Notes (Signed)
Pt complains of ongoing chest pain over the past 4 weeks with exertion.

## 2018-05-11 NOTE — ED Provider Notes (Signed)
[facility]    CSN: [id number] Arrival date & time: 05/11/18  1634     History   Chief Complaint Chief Complaint  Patient presents with  . Chest Pain     HPI Thomas Rowland is a 63 y.o. male.   This is the first [facility] urgent care visit for this 63 year old man complaining of chest pain when he walks.  He has risk factors for heart disease of hyperlipidemia, sleep apnea, erectile dysfunction, and hypertension.  He states that every morning when he walks to work over the last 4 weeks, he has substernal chest pain.  He has had no nausea, vomiting, diaphoresis.  He has had no pain this afternoon.  Patient tried to go to his personal doctor, Dr. Allyne Gee, but they refused to see him because his insurance changed.  Patient is from [place].  He has a Set designer job.     Past Medical History:  Diagnosis Date  . Hypertension   . Nonspecific abnormal electrocardiogram (ECG) (EKG) 06/26/2013  . OSA (obstructive sleep apnea) 06/26/2013    Patient Active Problem List   Diagnosis Date Noted  . Hypertension 06/26/2013  . OSA (obstructive sleep apnea) 06/26/2013  . Nonspecific abnormal electrocardiogram (ECG) (EKG) 06/26/2013    Past Surgical History:  Procedure Laterality Date  . COLONOSCOPY  2014   Normal  . LEG SURGERY Left 1997   [place]       Home Medications    Prior to Admission medications   Medication Sig Start Date End Date Taking? Authorizing Provider  amLODipine (NORVASC) 10 MG tablet TAKE ONE TABLET BY MOUTH EVERY DAY 04/15/18   Dorothyann Peng, MD  atorvastatin (LIPITOR) 40 MG tablet  04/06/17   [provider]  chlorthalidone (HYGROTON) 25 MG tablet Take 1 tablet by mouth daily. 06/24/13   [provider]  dorzolamide-timolol (COSOPT) 22.3-6.8 MG/ML ophthalmic solution Place 2 drops into both eyes daily. 05/06/13   [provider]  eplerenone (INSPRA) 50 MG tablet  04/06/17   [provider]  FLUZONE  HIGH-DOSE 0.5 ML injection INJECT INTRAMUSCUALRY ONCE 03/01/17   [provider]  ibuprofen (ADVIL,MOTRIN) 600 MG tablet Take 1 tablet (600 mg total) by mouth every 6 (six) hours as needed for moderate pain. 10/24/15   Tomasita Crumble, MD  latanoprost (XALATAN) 0.005 % ophthalmic solution Place 1 drop into both eyes daily. 04/30/13   [provider]  lisinopril (PRINIVIL,ZESTRIL) 40 MG tablet Take 1 tablet by mouth daily. 06/24/13   [provider]  methyldopa (ALDOMET) 250 MG tablet Take 250 mg by mouth 2 (two) times daily.    [provider]  metoprolol succinate (TOPROL-XL) 100 MG 24 hr tablet TAKE ONE TABLET BY MOUTH EVERY DAY 04/15/18   Dorothyann Peng, MD  metoprolol succinate (TOPROL-XL) 50 MG 24 hr tablet Take 1 tablet by mouth daily. 06/24/13   [provider]  potassium chloride SA (K-DUR,KLOR-CON) 20 MEQ tablet TAKE ONE TABLET BY MOUTH EVERY DAY 04/15/18   Dorothyann Peng, MD  PREVNAR 13 SUSP injection INJECT ONCE 03/01/17   [provider]  sildenafil (REVATIO) 20 MG tablet take 1 tablet by oral route every day as needed. 04/01/17   [provider]    Family History Family History  Problem Relation Age of Onset  . Hypertension Sister     Social History Social History   Tobacco Use  . Smoking status: Former Smoker    Last attempt to quit: 06/26/2001    Years since  quitting: 16.8  . Smokeless tobacco: Never Used  Substance Use Topics  . Alcohol use: Yes  . Drug use: No     Allergies   Patient has no known allergies.   Review of Systems Review of Systems  Respiratory: Positive for cough.   Cardiovascular: Positive for chest pain.  All other systems reviewed and are negative.    Physical Exam Triage Vital Signs ED Triage Vitals  Enc Vitals Group     BP      Pulse      Resp      Temp      Temp src      SpO2      Weight      Height      Head Circumference      Peak Flow      Pain Score      Pain Loc       Pain Edu?      Excl. in GC?    No data found.  Updated Vital Signs BP (!) 157/85 (BP Location: Right Arm)   Pulse (!) 54   Temp 98 F (36.7 C) (Oral)   Resp 18   SpO2 96%    Physical Exam Vitals signs and nursing note reviewed.  Constitutional:      Appearance: He is well-developed. He is obese. He is not ill-appearing or diaphoretic.  HENT:     Head: Normocephalic and atraumatic.  Eyes:     Pupils: Pupils are equal, round, and reactive to light.  Neck:     Musculoskeletal: Normal range of motion and neck supple.  Cardiovascular:     Rate and Rhythm: Normal rate and regular rhythm.     Heart sounds: Normal heart sounds. No murmur.  Pulmonary:     Effort: Pulmonary effort is normal.     Breath sounds: Normal breath sounds.  Chest:     Chest wall: No deformity or tenderness.  Abdominal:     General: Bowel sounds are normal.     Palpations: Abdomen is soft.  Musculoskeletal: Normal range of motion.  Skin:    General: Skin is warm and dry.  Neurological:     General: No focal deficit present.     Mental Status: He is alert.  Psychiatric:        Mood and Affect: Mood normal.      UC Treatments / Results  Labs (all labs ordered are listed, but only abnormal results are displayed) Labs Reviewed - No data to display  EKG EKG was read here and compared with previous EKG.  There is no change from EKG done 7 years ago. Radiology No results found.  Procedures Procedures (including critical care time)  Medications Ordered in UC Medications - No data to display  Initial Impression / Assessment and Plan / UC Course  I have reviewed the triage vital signs and the nursing notes.  Pertinent labs & imaging results that were available during my care of the patient were reviewed by me and considered in my medical decision making (see chart for details).    Final Clinical Impressions(s) / UC Diagnoses   Final diagnoses:  Stable angina [facility])     Discharge  Instructions     Continue taking your baby aspirin every day  Please call the cardiologist noted below for an appointment tomorrow.  If you cannot get an appointment, go to the emergency room for further evaluation.    ED Prescriptions    None  Controlled Substance Prescriptions [place] Controlled Substance Registry consulted? Not Applicable   Elvina SidleLauenstein, Jaimi Belle, MD 05/11/18 1719

## 2018-05-12 ENCOUNTER — Telehealth (HOSPITAL_COMMUNITY): Payer: Self-pay

## 2018-05-16 NOTE — Progress Notes (Deleted)
Cardiology Office Note   Date:  05/16/2018   ID:  Thomas Rowland, Thomas Rowland 07/02/1955, MRN [id number]  PCP:  Patient, No Pcp Per  Cardiologist:   Charlton Haws, MD   No chief complaint on file.     History of Present Illness: Thomas Rowland is a 63 y.o. male who presents for consultation regarding chest pain. Referred by  Dr Milus Glazier ED Reviewed his note form 05/11/18. Patient seen for chest pain. CRF;s include HLD and HTN  SSCP with walking to work in am Occurring last 5 weeks. 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 ECG showed inferior lateral T wave inversions These are chronic and seen on ECG fro 08/17/10  Pain is exertional Improves with rest. Not positional or pleuritic No GI overtones  ***  Past Medical History:  Diagnosis Date  . Hypertension   . Nonspecific abnormal electrocardiogram (ECG) (EKG) 06/26/2013  . OSA (obstructive sleep apnea) 06/26/2013    Past Surgical History:  Procedure Laterality Date  . COLONOSCOPY  2014   Normal  . LEG SURGERY Left 1997   [place]     Current Outpatient Medications  Medication Sig Dispense Refill  . amLODipine (NORVASC) 10 MG tablet TAKE ONE TABLET BY MOUTH EVERY DAY 90 tablet 0  . atorvastatin (LIPITOR) 40 MG tablet   0  . benzonatate (TESSALON) 100 MG capsule Take 1-2 capsules (100-200 mg total) by mouth 3 (three) times daily as needed for cough. 40 capsule 0  . chlorthalidone (HYGROTON) 25 MG tablet Take 1 tablet by mouth daily.    . dorzolamide-timolol (COSOPT) 22.3-6.8 MG/ML ophthalmic solution Place 2 drops into both eyes daily.    Marland Kitchen eplerenone (INSPRA) 50 MG tablet   4  . FLUZONE HIGH-DOSE 0.5 ML injection INJECT INTRAMUSCUALRY ONCE  0  . ibuprofen (ADVIL,MOTRIN) 600 MG tablet Take 1 tablet (600 mg total) by mouth every 6 (six) hours as needed for moderate pain. 15 tablet 0  . latanoprost (XALATAN) 0.005 % ophthalmic solution Place 1 drop into both  eyes daily.    Marland Kitchen lisinopril (PRINIVIL,ZESTRIL) 40 MG tablet Take 1 tablet by mouth daily.    . methyldopa (ALDOMET) 250 MG tablet Take 250 mg by mouth 2 (two) times daily.    . metoprolol succinate (TOPROL-XL) 100 MG 24 hr tablet TAKE ONE TABLET BY MOUTH EVERY DAY 90 tablet 0  . metoprolol succinate (TOPROL-XL) 50 MG 24 hr tablet Take 1 tablet by mouth daily.    . potassium chloride SA (K-DUR,KLOR-CON) 20 MEQ tablet TAKE ONE TABLET BY MOUTH EVERY DAY 90 tablet 0  . PREVNAR 13 SUSP injection INJECT ONCE  0  . sildenafil (REVATIO) 20 MG tablet take 1 tablet by oral route every day as needed.  3   No current facility-administered medications for this visit.     Allergies:   Patient has no known allergies.    Social History:  The patient  reports that he quit smoking about 16 years ago. He has never used smokeless tobacco. He reports current alcohol use. He reports that he does not use drugs.   Family History:  The patient's family history includes Hypertension in his sister.    ROS:  Please see the history of present illness.   Otherwise, review of systems are positive for {NONE DEFAULTED:18576::"none"}.   All other systems are reviewed and negative.    PHYSICAL EXAM: VS:  There were no vitals taken for this  visit. , BMI There is no height or weight on file to calculate BMI. Affect appropriate Healthy:  appears stated age HEENT: normal Neck supple with no adenopathy JVP normal no bruits no thyromegaly Lungs clear with no wheezing and good diaphragmatic motion Heart:  S1/S2 no murmur, no rub, gallop or click PMI normal Abdomen: benighn, BS positve, no tenderness, no AAA no bruit.  No HSM or HJR Distal pulses intact with no bruits No edema Neuro non-focal Skin warm and dry No muscular weakness    EKG:  See HPI    Recent Labs: No results found for requested labs within last 8760 hours.    Lipid Panel No results found for: CHOL, TRIG, HDL, CHOLHDL, VLDL, LDLCALC,  LDLDIRECT    Wt Readings from Last 3 Encounters:  10/23/15 191 lb 1 oz (86.7 kg)  06/26/13 188 lb (85.3 kg)      Other studies Reviewed: Additional studies/ records that were reviewed today include: Notes from Urgent care ECGls .    ASSESSMENT AND PLAN:  1.  Chest Pain:  Concerning for angina. Continue ASA/Beta blocker d/c norvasc add nitro Discussed options with patient Given concerning symptoms and abnormal ECG favor diagnostic heart cath. Risks including stroke, bleeding, contrast reaction , renal failure MI and need for emergency surgery discussed Willing to proceed. Orders written lab called ***  2. HTN:  Well controlled.  Continue current medications and low sodium Dash type diet.     3. HLD:  Continue statin labs with primary   .  The following changes have been made:  D/c norvasc start imdur 30 mg daily   Labs/ tests ordered today include: Pre cath  No orders of the defined types were placed in this encounter.    Disposition:   FU with PA/ me post cath      Signed, Charlton Haws, MD  05/16/2018 12:47 PM    [facility] [address] [place], [place], [place]  [address] Phone: [phone]; Fax: [phone]

## 2018-05-18 ENCOUNTER — Encounter

## 2018-05-18 ENCOUNTER — Ambulatory Visit: Payer: No Typology Code available for payment source | Admitting: Cardiovascular Disease

## 2018-07-18 ENCOUNTER — Ambulatory Visit (HOSPITAL_COMMUNITY): Payer: Self-pay | Admitting: *Deleted

## 2018-07-18 NOTE — Progress Notes (Signed)
Received referral for cardiac rehab via fax from Dr Noe Gens at [facility]. Pt had DES x 2 on 07/15/18.  Unclear when pt will complete his follow up appt. D/c indicated 3 months. Will have support staff create referral, verify insurance benefits and contact pt and/or son.  Pt does not speak english he speaks [place], Pharmacist, hospital, BSN Cardiac and Emergency planning/management officer

## 2018-07-19 ENCOUNTER — Telehealth (HOSPITAL_COMMUNITY): Payer: Self-pay

## 2018-07-19 NOTE — Telephone Encounter (Signed)
Attempted to call patient in regards to Cardiac Rehab - to let pt know we are closed at this time due to the COVID-19 and will contact once we have resume scheduling. LMTCB °Gloria W. Support Rep II ° °

## 2018-07-19 NOTE — Telephone Encounter (Signed)
Pt insurance is active and benefits verified through [employer] Co-pay 0, DED $3,000/$2,676.69 met, out of pocket $8,150/$2,971.69 met, co-insurance 20%. no pre-authorization required. Passport, 07/19/2018 @ 9:56am, REF# [id number]  Will contact patient to see if he is interested in the Cardiac Rehab Program. If interested, patient will need to complete follow up appt. Once completed, patient will be contacted for scheduling upon review by the RN Navigator. Tedra [place]. Support Rep II

## 2018-08-01 ENCOUNTER — Other Ambulatory Visit: Payer: Self-pay | Admitting: Internal Medicine

## 2018-08-04 ENCOUNTER — Encounter: Payer: Self-pay | Admitting: Internal Medicine

## 2018-08-25 ENCOUNTER — Telehealth (HOSPITAL_COMMUNITY): Payer: Self-pay

## 2018-12-26 ENCOUNTER — Ambulatory Visit: Payer: BLUE CROSS/BLUE SHIELD | Admitting: Cardiology

## 2019-04-14 LAB — HEMOGLOBIN A1C: Hemoglobin A1C: 7.3

## 2019-09-12 ENCOUNTER — Encounter: Payer: Self-pay | Admitting: Internal Medicine

## 2019-09-12 ENCOUNTER — Other Ambulatory Visit: Payer: Self-pay

## 2019-09-12 ENCOUNTER — Ambulatory Visit: Payer: 59 | Admitting: Nurse Practitioner

## 2019-09-12 ENCOUNTER — Encounter: Payer: Self-pay | Admitting: Nurse Practitioner

## 2019-09-12 VITALS — BP 130/80 | HR 60 | Temp 98.8°F | Ht 64.5 in | Wt 186.6 lb

## 2019-09-12 DIAGNOSIS — K5900 Constipation, unspecified: Secondary | ICD-10-CM | POA: Diagnosis not present

## 2019-09-12 DIAGNOSIS — Z1159 Encounter for screening for other viral diseases: Secondary | ICD-10-CM

## 2019-09-12 DIAGNOSIS — I2510 Atherosclerotic heart disease of native coronary artery without angina pectoris: Secondary | ICD-10-CM | POA: Diagnosis not present

## 2019-09-12 DIAGNOSIS — I10 Essential (primary) hypertension: Secondary | ICD-10-CM | POA: Diagnosis not present

## 2019-09-12 DIAGNOSIS — E119 Type 2 diabetes mellitus without complications: Secondary | ICD-10-CM | POA: Diagnosis not present

## 2019-09-12 DIAGNOSIS — M25521 Pain in right elbow: Secondary | ICD-10-CM

## 2019-09-12 MED ORDER — COLCHICINE 0.6 MG PO CAPS: 1.0000 | ORAL_CAPSULE | Freq: Three times a day (TID) | ORAL | 2 refills | Status: DC | PRN

## 2019-09-12 NOTE — Progress Notes (Signed)
This visit occurred during the SARS-CoV-2 public health emergency.  Safety protocols were in place, including screening questions prior to the visit, additional usage of staff PPE, and extensive cleaning of exam room while observing appropriate contact time as indicated for disinfecting solutions.  Subjective:     Patient ID: Thomas Rowland , male    DOB: [birth year] , 64 y.o.   MRN: [id number]   Chief Complaint  Patient presents with  . Hypertension  . Diabetes  . Arm Pain    HPI  He is here today for a follow up, he has not been in our office since January of 2020 he was going to [facility] due to his insurance. According to his records on [facility] Everywhere he is newly diagnosed type 2 diabetes.  He is now on metformin.  He also had to have a stent placed in January 2020 and is now to be followed by cardiology.  He has a history CAD s/p PCI to his LCX and RCA in March 2020.   Today he is also having right elbow pain since yesterday and is unable to straighten out.  He is currently taking allopurinol and had been taking Colchicine as needed.   He also tells me he had painful bowel movement this morning with a hard stool.  He does not take a stool softner.     Hypertension This is a chronic problem. The current episode started more than 1 year ago. The problem has been gradually worsening since onset. The problem is uncontrolled. Pertinent negatives include no anxiety, blurred vision, chest pain, headaches or palpitations. There are no associated agents to hypertension. Risk factors for coronary artery disease include obesity and diabetes mellitus. Past treatments include diuretics. The current treatment provides no improvement. There are no compliance problems.  There is no history of angina.  Diabetes He presents for his follow-up diabetic visit. He has type 2 diabetes mellitus. There are no hypoglycemic associated symptoms. Pertinent negatives for hypoglycemia  include no dizziness or headaches. Pertinent negatives for diabetes include no blurred vision, no chest pain, no fatigue, no polydipsia, no polyphagia and no polyuria. Symptoms are stable.  Arm Pain  Pertinent negatives include no chest pain.     Past Medical History:  Diagnosis Date  . Hypertension   . Nonspecific abnormal electrocardiogram (ECG) (EKG) 06/26/2013  . OSA (obstructive sleep apnea) 06/26/2013     Family History  Problem Relation Age of Onset  . Hypertension Sister      Current Outpatient Medications:  .  allopurinol (ZYLOPRIM) 100 MG tablet, Take 100 mg by mouth daily., Disp: , Rfl:  .  amLODipine (NORVASC) 10 MG tablet, TAKE ONE TABLET BY MOUTH EVERY DAY, Disp: 90 tablet, Rfl: 0 .  atorvastatin (LIPITOR) 40 MG tablet, , Disp: , Rfl: 0 .  chlorthalidone (HYGROTON) 25 MG tablet, Take 1 tablet by mouth daily., Disp: , Rfl:  .  dorzolamide-timolol (COSOPT) 22.3-6.8 MG/ML ophthalmic solution, Place 2 drops into both eyes daily., Disp: , Rfl:  .  metFORMIN (GLUCOPHAGE) 500 MG tablet, Take 500 mg by mouth daily with breakfast., Disp: , Rfl:  .  metoprolol succinate (TOPROL-XL) 100 MG 24 hr tablet, TAKE ONE TABLET BY MOUTH EVERY DAY, Disp: 90 tablet, Rfl: 0 .  potassium chloride SA (K-DUR,KLOR-CON) 20 MEQ tablet, TAKE ONE TABLET BY MOUTH EVERY DAY, Disp: 90 tablet, Rfl: 0 .  sildenafil (REVATIO) 20 MG tablet, take 1 tablet by oral route every day as needed., Disp: ,  Rfl: 3 .  ticagrelor (BRILINTA) 90 MG TABS tablet, Take 90 mg by mouth 2 (two) times daily., Disp: , Rfl:  .  benzonatate (TESSALON) 100 MG capsule, Take 1-2 capsules (100-200 mg total) by mouth 3 (three) times daily as needed for cough. (Patient not taking: Reported on 09/12/2019), Disp: 40 capsule, Rfl: 0 .  eplerenone (INSPRA) 50 MG tablet, , Disp: , Rfl: 4 .  FLUZONE HIGH-DOSE 0.5 ML injection, INJECT INTRAMUSCUALRY ONCE, Disp: , Rfl: 0 .  ibuprofen (ADVIL,MOTRIN) 600 MG tablet, Take 1 tablet (600 mg total) by mouth  every 6 (six) hours as needed for moderate pain. (Patient not taking: Reported on 09/12/2019), Disp: 15 tablet, Rfl: 0 .  latanoprost (XALATAN) 0.005 % ophthalmic solution, Place 1 drop into both eyes daily., Disp: , Rfl:  .  lisinopril (PRINIVIL,ZESTRIL) 40 MG tablet, Take 1 tablet by mouth daily., Disp: , Rfl:  .  methyldopa (ALDOMET) 250 MG tablet, Take 250 mg by mouth 2 (two) times daily., Disp: , Rfl:  .  PREVNAR 13 SUSP injection, INJECT ONCE, Disp: , Rfl: 0   No Known Allergies   Review of Systems  Constitutional: Negative.  Negative for fatigue.  Eyes: Negative for blurred vision.  Respiratory: Negative.   Cardiovascular: Negative.  Negative for chest pain, palpitations and leg swelling.  Endocrine: Negative for polydipsia, polyphagia and polyuria.  Musculoskeletal:       Right elbow pain and difficulty with extending completely.    Neurological: Negative for dizziness and headaches.  Psychiatric/Behavioral: Negative.      Today's Vitals   09/12/19 1534  BP: 130/80  Pulse: 60  Temp: 98.8 F (37.1 C)  Weight: 186 lb 9.6 oz (84.6 kg)  Height: 5' 4.5" (1.638 m)  PainSc: 0-No pain   Body mass index is 31.54 kg/m.   Objective:  Physical Exam Constitutional:      General: He is not in acute distress.    Appearance: Normal appearance. He is obese.  Cardiovascular:     Rate and Rhythm: Normal rate and regular rhythm.     Pulses: Normal pulses.     Heart sounds: Normal heart sounds. No murmur.  Pulmonary:     Effort: Pulmonary effort is normal. No respiratory distress.     Breath sounds: Normal breath sounds.  Musculoskeletal:        General: Tenderness (right elbow medial aspect pain) present.  Skin:    General: Skin is warm and dry.     Capillary Refill: Capillary refill takes less than 2 seconds.  Neurological:     General: No focal deficit present.     Mental Status: He is alert and oriented to person, place, and time.  Psychiatric:        Mood and Affect: Mood  normal.        Behavior: Behavior normal.        Thought Content: Thought content normal.        Judgment: Judgment normal.         Assessment And Plan:     1. Essential hypertension  Chronic, fair control   Continue with current medications  I will refer him to cardiology he had been going to [facility] over the last year - Ambulatory referral to Cardiology  2. Coronary artery disease involving native coronary artery of native heart without angina pectoris  Will refer to cardiology for his Follow up  3. Type 2 diabetes mellitus without complication, without long-term current use of insulin ([place])  He is newly diagnosed diabetes with a HgbA1c of 7.2 in December  I will recheck his levels today  Continue with metformin as directed - Lipid panel - Hemoglobin A1c - CMP14+EGFR  4. Constipation, unspecified constipation type  I have given him miralax powder to see if this is effective.   Encouraged to stay well hydrated with water and eat high fiber diet.  5. Right elbow pain  Tenderness to medial aspect of right elbow, this may be a gout flare, refilled his colchicine as well.  - Colchicine (MITIGARE) 0.6 MG CAPS; Take 1 capsule by mouth 3 (three) times daily as needed.  Dispense: 30 capsule; Refill: 2 - Uric acid   Minette Brine, FNP    THE PATIENT IS ENCOURAGED TO PRACTICE SOCIAL DISTANCING DUE TO THE COVID-19 PANDEMIC.

## 2019-09-13 ENCOUNTER — Other Ambulatory Visit: Payer: Self-pay

## 2019-09-13 DIAGNOSIS — M25521 Pain in right elbow: Secondary | ICD-10-CM

## 2019-09-13 LAB — CMP14+EGFR
ALT: 19 IU/L (ref 0–44)
AST: 23 IU/L (ref 0–40)
Albumin/Globulin Ratio: 1.5 (ref 1.2–2.2)
Albumin: 4.2 g/dL (ref 3.8–4.8)
Alkaline Phosphatase: 61 IU/L (ref 48–121)
BUN/Creatinine Ratio: 18 (ref 10–24)
BUN: 24 mg/dL (ref 8–27)
Bilirubin Total: 0.3 mg/dL (ref 0.0–1.2)
CO2: 28 mmol/L (ref 20–29)
Calcium: 9.4 mg/dL (ref 8.6–10.2)
Chloride: 97 mmol/L (ref 96–106)
Creatinine, Ser: 1.35 mg/dL — ABNORMAL HIGH (ref 0.76–1.27)
GFR calc Af Amer: 64 mL/min/{1.73_m2} (ref >59)
GFR calc non Af Amer: 55 mL/min/{1.73_m2} — ABNORMAL LOW (ref >59)
Globulin, Total: 2.8 g/dL (ref 1.5–4.5)
Glucose: 96 mg/dL (ref 65–99)
Potassium: 3.9 mmol/L (ref 3.5–5.2)
Sodium: 140 mmol/L (ref 134–144)
Total Protein: 7 g/dL (ref 6.0–8.5)

## 2019-09-13 LAB — LIPID PANEL
Chol/HDL Ratio: 2.7 ratio (ref 0.0–5.0)
Cholesterol, Total: 133 mg/dL (ref 100–199)
HDL: 50 mg/dL (ref >39)
LDL Chol Calc (NIH): 67 mg/dL (ref 0–99)
Triglycerides: 79 mg/dL (ref 0–149)
VLDL Cholesterol Cal: 16 mg/dL (ref 5–40)

## 2019-09-13 LAB — URIC ACID: Uric Acid: 9.4 mg/dL — ABNORMAL HIGH (ref 3.8–8.4)

## 2019-09-13 LAB — HEPATITIS C ANTIBODY: Hep C Virus Ab: <0.1 s/co ratio (ref 0.0–0.9)

## 2019-09-13 LAB — HEMOGLOBIN A1C
Est. average glucose Bld gHb Est-mCnc: 120 mg/dL
Hgb A1c MFr Bld: 5.8 % — ABNORMAL HIGH (ref 4.8–5.6)

## 2019-09-13 MED ORDER — MITIGARE 0.6 MG PO CAPS: 1.0000 | ORAL_CAPSULE | Freq: Three times a day (TID) | ORAL | 2 refills | Status: DC | PRN

## 2019-09-13 MED ORDER — TRAMADOL HCL 50 MG PO TABS: 50.0000 mg | ORAL_TABLET | Freq: Four times a day (QID) | ORAL | 0 refills | Status: DC | PRN

## 2019-09-13 MED ORDER — ALLOPURINOL 100 MG PO TABS: 100.0000 mg | ORAL_TABLET | Freq: Every day | ORAL | 0 refills | Status: DC

## 2019-10-02 ENCOUNTER — Ambulatory Visit (INDEPENDENT_AMBULATORY_CARE_PROVIDER_SITE_OTHER): Payer: 59 | Admitting: Nurse Practitioner

## 2019-10-02 ENCOUNTER — Ambulatory Visit: Payer: 59 | Admitting: Nurse Practitioner

## 2019-10-02 ENCOUNTER — Other Ambulatory Visit: Payer: Self-pay

## 2019-10-02 ENCOUNTER — Encounter: Payer: Self-pay | Admitting: Internal Medicine

## 2019-10-02 ENCOUNTER — Encounter: Payer: Self-pay | Admitting: Nurse Practitioner

## 2019-10-02 VITALS — BP 122/60 | HR 72 | Temp 98.7°F | Ht 64.6 in | Wt 185.4 lb

## 2019-10-02 DIAGNOSIS — M10472 Other secondary gout, left ankle and foot: Secondary | ICD-10-CM | POA: Diagnosis not present

## 2019-10-02 MED ORDER — KETOROLAC TROMETHAMINE 60 MG/2ML IM SOLN
60.0000 mg | Freq: Once | INTRAMUSCULAR | Status: AC
  Administered 2019-10-02: 60 mg via INTRAMUSCULAR

## 2019-10-02 MED ORDER — TRIAMCINOLONE ACETONIDE 40 MG/ML IJ SUSP
40.0000 mg | Freq: Once | INTRAMUSCULAR | Status: AC
  Administered 2019-10-02: 40 mg via INTRAMUSCULAR

## 2019-10-02 MED ORDER — ALLOPURINOL 100 MG PO TABS: 150.0000 mg | ORAL_TABLET | Freq: Every day | ORAL | 1 refills | Status: DC

## 2019-10-02 NOTE — Progress Notes (Signed)
This visit occurred during the SARS-CoV-2 public health emergency.  Safety protocols were in place, including screening questions prior to the visit, additional usage of staff PPE, and extensive cleaning of exam room while observing appropriate contact time as indicated for disinfecting solutions.  Subjective:     Patient ID: Thomas Rowland , male    DOB: July 04, 1955 , 64 y.o.   MRN: [id number]   Chief Complaint  Patient presents with  . Gout    HPI He is here today with a complaint of gout flare in his great toe on the left foot. This started two days but he denies drinking  beer or wine.He did eat beef skin yesterday and he eats chicken livers. He is currently taking allopurinol as directed and had a similar gout attack in his left knee.     Past Medical History:  Diagnosis Date  . Hypertension   . Nonspecific abnormal electrocardiogram (ECG) (EKG) 06/26/2013  . OSA (obstructive sleep apnea) 06/26/2013     Family History  Problem Relation Age of Onset  . Hypertension Sister      Current Outpatient Medications:  .  allopurinol (ZYLOPRIM) 100 MG tablet, Take 1 tablet (100 mg total) by mouth daily., Disp: 90 tablet, Rfl: 0 .  amLODipine (NORVASC) 10 MG tablet, TAKE ONE TABLET BY MOUTH EVERY DAY, Disp: 90 tablet, Rfl: 0 .  atorvastatin (LIPITOR) 40 MG tablet, , Disp: , Rfl: 0 .  chlorthalidone (HYGROTON) 25 MG tablet, Take 1 tablet by mouth daily., Disp: , Rfl:  .  dorzolamide-timolol (COSOPT) 22.3-6.8 MG/ML ophthalmic solution, Place 2 drops into both eyes daily., Disp: , Rfl:  .  eplerenone (INSPRA) 50 MG tablet, , Disp: , Rfl: 4 .  FLUZONE HIGH-DOSE 0.5 ML injection, INJECT INTRAMUSCUALRY ONCE, Disp: , Rfl: 0 .  ibuprofen (ADVIL,MOTRIN) 600 MG tablet, Take 1 tablet (600 mg total) by mouth every 6 (six) hours as needed for moderate pain., Disp: 15 tablet, Rfl: 0 .  latanoprost (XALATAN) 0.005 % ophthalmic solution, Place 1 drop into both eyes daily., Disp: , Rfl:  .  lisinopril  (PRINIVIL,ZESTRIL) 40 MG tablet, Take 1 tablet by mouth daily., Disp: , Rfl:  .  metFORMIN (GLUCOPHAGE) 500 MG tablet, Take 500 mg by mouth daily with breakfast., Disp: , Rfl:  .  methyldopa (ALDOMET) 250 MG tablet, Take 250 mg by mouth 2 (two) times daily., Disp: , Rfl:  .  metoprolol succinate (TOPROL-XL) 100 MG 24 hr tablet, TAKE ONE TABLET BY MOUTH EVERY DAY, Disp: 90 tablet, Rfl: 0 .  MITIGARE 0.6 MG CAPS, Take 1 capsule by mouth 3 (three) times daily as needed., Disp: 30 capsule, Rfl: 2 .  potassium chloride SA (K-DUR,KLOR-CON) 20 MEQ tablet, TAKE ONE TABLET BY MOUTH EVERY DAY, Disp: 90 tablet, Rfl: 0 .  PREVNAR 13 SUSP injection, INJECT ONCE, Disp: , Rfl: 0 .  sildenafil (REVATIO) 20 MG tablet, take 1 tablet by oral route every day as needed., Disp: , Rfl: 3 .  ticagrelor (BRILINTA) 90 MG TABS tablet, Take 90 mg by mouth 2 (two) times daily., Disp: , Rfl:  .  traMADol (ULTRAM) 50 MG tablet, Take 1 tablet (50 mg total) by mouth every 6 (six) hours as needed., Disp: 20 tablet, Rfl: 0 .  benzonatate (TESSALON) 100 MG capsule, Take 1-2 capsules (100-200 mg total) by mouth 3 (three) times daily as needed for cough. (Patient not taking: Reported on 09/12/2019), Disp: 40 capsule, Rfl: 0   No Known Allergies   Review of  Systems  Constitutional: Negative.  Negative for fatigue.  HENT: Negative.   Eyes: Negative for blurred vision.  Respiratory: Negative.   Cardiovascular: Negative.  Negative for chest pain, palpitations and leg swelling.  Musculoskeletal: Positive for joint swelling. Negative for back pain and gait problem.       Left great toe pain with swelling.    Neurological: Negative for dizziness and headaches.  Hematological: Negative.   Psychiatric/Behavioral: Negative.      Today's Vitals   10/02/19 1624  BP: 122/60  Pulse: 72  Temp: 98.7 F (37.1 C)  Weight: 185 lb 6.4 oz (84.1 kg)  Height: 5' 4.6" (1.641 m)  PainSc: 8    Body mass index is 31.24 kg/m.   Objective:   Physical Exam Constitutional:      General: He is not in acute distress.    Appearance: Normal appearance. He is obese.  Cardiovascular:     Rate and Rhythm: Normal rate and regular rhythm.     Pulses: Normal pulses.     Heart sounds: Normal heart sounds. No murmur.  Pulmonary:     Effort: Pulmonary effort is normal. No respiratory distress.     Breath sounds: Normal breath sounds.  Musculoskeletal:        General: Tenderness (right elbow medial aspect pain) present.  Skin:    General: Skin is warm and dry.     Capillary Refill: Capillary refill takes less than 2 seconds.     Findings: Erythema present.     Comments: Left Great toe warm to touch.  Neurological:     General: No focal deficit present.     Mental Status: He is alert and oriented to person, place, and time.  Psychiatric:        Mood and Affect: Mood normal.        Behavior: Behavior normal.        Thought Content: Thought content normal.        Judgment: Judgment normal.         Assessment And Plan:     1. 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  I will increase his allopurinol to 150 mg daily   He is to continue avoiding his triggers.  - ketorolac (TORADOL) injection 60 mg - triamcinolone acetonide (KENALOG-40) injection 40 mg - allopurinol (ZYLOPRIM) 100 MG tablet; Take 1.5 tablets (150 mg total) by mouth daily.  Dispense: 135 tablet; Refill: 1      Marylu Lund, RN    THE PATIENT IS ENCOURAGED TO PRACTICE SOCIAL DISTANCING DUE TO THE COVID-19 PANDEMIC.

## 2019-10-13 ENCOUNTER — Encounter: Payer: Self-pay | Admitting: Cardiology

## 2019-10-13 ENCOUNTER — Other Ambulatory Visit: Payer: Self-pay

## 2019-10-13 ENCOUNTER — Ambulatory Visit: Payer: 59 | Admitting: Cardiology

## 2019-10-13 VITALS — BP 131/84 | HR 79 | Resp 17 | Ht 67.0 in | Wt 180.0 lb

## 2019-10-13 DIAGNOSIS — Z8639 Personal history of other endocrine, nutritional and metabolic disease: Secondary | ICD-10-CM

## 2019-10-13 DIAGNOSIS — E119 Type 2 diabetes mellitus without complications: Secondary | ICD-10-CM

## 2019-10-13 DIAGNOSIS — E782 Mixed hyperlipidemia: Secondary | ICD-10-CM

## 2019-10-13 DIAGNOSIS — Z87891 Personal history of nicotine dependence: Secondary | ICD-10-CM

## 2019-10-13 DIAGNOSIS — I2510 Atherosclerotic heart disease of native coronary artery without angina pectoris: Secondary | ICD-10-CM

## 2019-10-13 DIAGNOSIS — Z8616 Personal history of COVID-19: Secondary | ICD-10-CM

## 2019-10-13 DIAGNOSIS — Z955 Presence of coronary angioplasty implant and graft: Secondary | ICD-10-CM

## 2019-10-13 DIAGNOSIS — I10 Essential (primary) hypertension: Secondary | ICD-10-CM

## 2019-10-13 MED ORDER — CLOPIDOGREL BISULFATE 75 MG PO TABS: 75.0000 mg | ORAL_TABLET | Freq: Every day | ORAL | 0 refills | Status: DC

## 2019-10-13 MED ORDER — LOSARTAN POTASSIUM 25 MG PO TABS: 25.0000 mg | ORAL_TABLET | Freq: Every morning | ORAL | 3 refills | Status: DC

## 2019-10-13 NOTE — Progress Notes (Signed)
Date:  10/13/2019   ID:  Thomas, Rowland 1955-05-19, MRN [id number]  PCP:  Thomas Peng, MD  Cardiologist:  Thomas Lerner, DO, [facility] (established care 10/13/2019) Former Cardiology Providers: Dr. Yates Rowland and Dr. Jordan Rowland.   REASON FOR CONSULT: Hypertension and coronary artery disease  REQUESTING PHYSICIAN:  Thomas Peng, MD [address] [place],  [place] [address]  Chief Complaint  Patient presents with  . Hypertension  . New Patient (Initial Visit)  . Coronary Artery Disease    HPI  Thomas Rowland is a 64 y.o. male of [place] descent who presents to the office with a chief complaint of " establish care with cardiology." He is referred to the office at the request of Thomas Peng, MD. 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  Patient is accompanied by his family friend Fuller Plan [phone]) and provides verbal consent in regards to discussing his medical conditions in her presence will also serve as interpreter.  Patient was originally in the area of Dr. Yates Rowland and due to insurance issues had transitioned his care to [facility].  He was last seen in the office back in July 2017.  Since then patient has had chest pain concerning for anginal symptoms and underwent stress test which was reported to be abnormal.  He underwent elective angiography and was found to have obstructive CAD.  Based on the records were obtained during today's office visit by calling his former cardiologist he has had 2 stents in the RCA and one in the LCx.  Records forthcoming.  Patient has been on aspirin and Brilinta for 1 year.  Currently patient denies any chest pain or shortness of breath at rest or with effort related activities.  FUNCTIONAL  STATUS: No structured exercise program or daily routine. 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    ALLERGIES: Allergies  Allergen Reactions  . Lisinopril Cough    MEDICATION LIST PRIOR TO VISIT: Current Meds  Medication Sig  . allopurinol (ZYLOPRIM) 100 MG tablet Take 1.5 tablets (150 mg total) by mouth daily.  Marland Kitchen amLODipine (NORVASC) 10 MG tablet TAKE ONE TABLET BY MOUTH EVERY DAY  . aspirin 81 MG EC tablet Take 1 tablet by mouth daily.  Marland Kitchen atorvastatin (LIPITOR) 40 MG tablet   . chlorthalidone (HYGROTON) 25 MG tablet Take 1 tablet by mouth daily.  . dorzolamide-timolol (COSOPT) 22.3-6.8 MG/ML ophthalmic solution Place 2 drops into both eyes daily.  . ferrous sulfate 325 (65 FE) MG EC tablet Take 325 mg by mouth 3 (three) times daily with meals.  Marland Kitchen FLUZONE HIGH-DOSE 0.5 ML injection INJECT INTRAMUSCUALRY ONCE  . latanoprost (XALATAN) 0.005 % ophthalmic solution Place 1 drop into both eyes daily.  . metFORMIN (GLUCOPHAGE) 500 MG tablet Take 500 mg by mouth daily with breakfast.  . metoprolol succinate (TOPROL-XL) 100 MG 24 hr tablet TAKE ONE TABLET BY MOUTH EVERY DAY  . MITIGARE 0.6 MG CAPS Take 1 capsule by mouth 3 (three) times daily as needed.  . multivitamin-iron-minerals-folic acid (CENTRUM) chewable tablet Chew 1 tablet by mouth daily.  . potassium chloride SA (K-DUR,KLOR-CON) 20 MEQ tablet TAKE ONE TABLET BY MOUTH EVERY DAY  . traMADol (ULTRAM) 50 MG tablet Take 1 tablet (50 mg total) by mouth every 6 (six) hours as needed.     PAST MEDICAL HISTORY: Past Medical History:  Diagnosis Date  . Coronary artery disease   . Hyperlipidemia   . Hypertension   . Nonspecific abnormal electrocardiogram (ECG) (EKG) 06/26/2013    PAST SURGICAL HISTORY: Past Surgical History:  Procedure Laterality Date  . COLONOSCOPY  2014   Normal  . CORONARY ANGIOPLASTY WITH STENT PLACEMENT    . LEG SURGERY Left 1997   [place]    FAMILY HISTORY: The patient family history includes Hypertension in  his sister.  SOCIAL HISTORY:  The patient  reports that he quit smoking about 18 years ago. His smoking use included cigarettes. He has a 7.50 pack-year smoking history. He has never used smokeless tobacco. He reports current alcohol use. He reports that he does not use drugs.  REVIEW OF SYSTEMS: Review of Systems  Constitutional: Negative for chills and fever.  HENT: Negative for hoarse voice and nosebleeds.   Eyes: Negative for discharge, double vision and pain.  Cardiovascular: Negative for chest pain, claudication, dyspnea on exertion, leg swelling, near-syncope, orthopnea, palpitations, paroxysmal nocturnal dyspnea and syncope.  Respiratory: Negative for hemoptysis and shortness of breath.   Musculoskeletal: Negative for muscle cramps and myalgias.  Gastrointestinal: Negative for abdominal pain, constipation, diarrhea, hematemesis, hematochezia, melena, nausea and vomiting.  Neurological: Negative for dizziness and light-headedness.    PHYSICAL EXAM: Vitals with BMI 10/13/2019 10/02/2019 09/12/2019  Height 5\' 7"  5' 4.6" 5' 4.5"  Weight 180 lbs 185 lbs 6 oz 186 lbs 10 oz  BMI 28.19 31.23 31.55  Systolic 131 122  Diastolic 84 60 80  Pulse 79 72 60   CONSTITUTIONAL: Well-developed and well-nourished. No acute distress.  SKIN: Skin is warm and dry. No rash noted. No cyanosis. No pallor. No jaundice HEAD: Normocephalic and atraumatic.  EYES: No scleral icterus MOUTH/THROAT: Moist oral membranes.  NECK: No JVD present. No thyromegaly noted. No carotid bruits  LYMPHATIC: No visible cervical adenopathy.  CHEST Normal respiratory effort. No intercostal retractions  LUNGS: Clear to auscultation bilaterally.  No stridor. No wheezes. No rales.  CARDIOVASCULAR: Regular, positive S1-S2, no murmurs rubs or gallops appreciated.  ABDOMINAL: Soft, nontender, nondistended, positive bowel sounds all 4 quadrants. No apparent ascites.  EXTREMITIES: No peripheral edema  HEMATOLOGIC: No  significant bruising NEUROLOGIC: Oriented to person, place, and time. Nonfocal. Normal muscle tone.  PSYCHIATRIC: Normal mood and affect. Normal behavior. Cooperative  CARDIAC DATABASE: EKG: 10/13/2019: Normal sinus rhythm, 86 bpm, old inferior infarct, ST-T changes in the anterolateral.  Echocardiogram: 05/22/2013: LVEF 55%, moderate LVH, grade 2 diastolic impairment, mild LAE.   Stress Testing: 06/15/2019 from care everywhere (per report):  1. Potential small area of pharmacologically induced ischemia involving the mid aspect of the inferior wall of the left ventricle.  2. No scintigraphic evidence of prior infarction.  3. Mild hypokinesia involving the inferior wall of the left ventricle.  4. Ejection fraction - 50%.  Heart Catheterization: 07/15/2018 LHC with Dr. 07/17/2018 at [facility] (care everywhere): "Angiography revealed mild LAD, 80% mLCx, 95% mRCA. Stented RCA with 3.5x18 and 3.5x12 Onyx DES with good angiographic result. Stented LCX with a 3.5x15 Onyx DES with good angiographic result."  LABORATORY DATA: CBC Latest Ref Rng & Units 08/08/2010  WBC 4.0 - 10.5 K/uL 10.5  Hemoglobin 13.0 - 17.0 g/dL 08/10/2010  Hematocrit 39 - 52 % 38.7(L)  Platelets 150 - 400 K/uL 163    CMP Latest Ref Rng & Units 09/12/2019 08/08/2010  Glucose 65 - 99 mg/dL 96 08/10/2010)  BUN 8 - 27 mg/dL 24 14  Creatinine 323(F - 1.27  mg/dL 0.24(O) 9.73  Sodium 532 - 144 mmol/L 140 139  Potassium 3.5 - 5.2 mmol/L 3.9 2.3 REPEATED TO VERIFY CRITICAL RESULT CALLED TO, READ BACK BY AND VERIFIED WITH: B SANGALANG,RN [id number] WILDERK(LL)  Chloride 96 - 106 mmol/L 97 96  CO2 20 - 29 mmol/L 28 32  Calcium 8.6 - 10.2 mg/dL 9.4 9.0  Total Protein 6.0 - 8.5 g/dL 7.0 -  Total Bilirubin 0.0 - 1.2 mg/dL 0.3 -  Alkaline Phos 48 - 121 IU/L 61 -  AST 0 - 40 IU/L 23 -  ALT 0 - 44 IU/L 19 -    Lipid Panel     Component Value Date/Time   CHOL 133 09/12/2019 1627   TRIG 79 09/12/2019 1627   HDL 50 09/12/2019 1627   CHOLHDL 2.7  09/12/2019 1627   LDLCALC 67 09/12/2019 1627   LABVLDL 16 09/12/2019 1627    No components found for: NTPROBNP No results for input(s): PROBNP in the last 8760 hours. No results for input(s): TSH in the last 8760 hours.  BMP Recent Labs    09/12/19 1627  NA 140  K 3.9  CL 97  CO2 28  GLUCOSE 96  BUN 24  CREATININE 1.35*  CALCIUM 9.4  GFRNONAA 55*  GFRAA 64    HEMOGLOBIN A1C Lab Results  Component Value Date   HGBA1C 5.8 (H) 09/12/2019    IMPRESSION:    ICD-10-CM   1. Atherosclerosis of native coronary artery of native heart without angina pectoris  I25.10 PCV ECHOCARDIOGRAM COMPLETE    clopidogrel (PLAVIX) 75 MG tablet  2. History of coronary angioplasty with insertion of stent  Z95.5 clopidogrel (PLAVIX) 75 MG tablet  3. Essential hypertension  I10 EKG 12-Lead    losartan (COZAAR) 25 MG tablet    Basic metabolic panel    Magnesium  4. Mixed hyperlipidemia  E78.2   5. Hx of hyperaldosteronism  Z86.39   6. Former smoker  Z87.891   7. Non-insulin dependent type 2 diabetes mellitus (HCC)  E11.9   8. History of COVID-19  Z86.16      RECOMMENDATIONS: Thomas Rowland is a 64 y.o. 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  Atherosclerosis of the native coronary arteries without angina pectoris of prior stents:  Given the number of PCI's, his DAPT score, and low bleeding risk shared decision was to transition him from Brilinta to Plavix 75 mg p.o. daily.   Continue aspirin 81 mg p.o. daily  Continue statin therapy.  Will initiate losartan 25 mg p.o. daily  Echocardiogram will be ordered to evaluate for structural heart disease and left ventricular systolic function.  History of coronary artery stents: Stable  Benign essential hypertension:  Medications  reconciled.  We will initiate losartan 25 mg p.o. daily.  Blood work in 1 week to evaluate kidney function electrolytes  Mixed hyperlipidemia: Continue statin therapy.  He does not endorse myalgias.  Most recent lipid profile reviewed.  Former smoker: Educated on the importance of continued smoking cessation.  Non-insulin-dependent diabetes: Currently managed by primary care provider.  Patient educated on importance of glycemic control given his underlying CAD.  Total encounter time 60 minutes.  With regard to complex medical decision making, informing the patient via an interpreter, and requesting records from his prior cardiology office during today's visit to provide transition of care.  FINAL MEDICATION LIST  END OF ENCOUNTER: Meds ordered this encounter  Medications  . losartan (COZAAR) 25 MG tablet    Sig: Take 1 tablet (25 mg total) by mouth in the morning.    Dispense:  90 tablet    Refill:  3  . clopidogrel (PLAVIX) 75 MG tablet    Sig: Take 1 tablet (75 mg total) by mouth daily.    Dispense:  90 tablet    Refill:  0     Current Outpatient Medications:  .  allopurinol (ZYLOPRIM) 100 MG tablet, Take 1.5 tablets (150 mg total) by mouth daily., Disp: 135 tablet, Rfl: 1 .  amLODipine (NORVASC) 10 MG tablet, TAKE ONE TABLET BY MOUTH EVERY DAY, Disp: 90 tablet, Rfl: 0 .  aspirin 81 MG EC tablet, Take 1 tablet by mouth daily., Disp: , Rfl:  .  atorvastatin (LIPITOR) 40 MG tablet, , Disp: , Rfl: 0 .  chlorthalidone (HYGROTON) 25 MG tablet, Take 1 tablet by mouth daily., Disp: , Rfl:  .  dorzolamide-timolol (COSOPT) 22.3-6.8 MG/ML ophthalmic solution, Place 2 drops into both eyes daily., Disp: , Rfl:  .  ferrous sulfate 325 (65 FE) MG EC tablet, Take 325 mg by mouth 3 (three) times daily with meals., Disp: , Rfl:  .  FLUZONE HIGH-DOSE 0.5 ML injection, INJECT INTRAMUSCUALRY ONCE, Disp: , Rfl: 0 .  latanoprost (XALATAN) 0.005 % ophthalmic solution, Place 1 drop into both eyes  daily., Disp: , Rfl:  .  metFORMIN (GLUCOPHAGE) 500 MG tablet, Take 500 mg by mouth daily with breakfast., Disp: , Rfl:  .  metoprolol succinate (TOPROL-XL) 100 MG 24 hr tablet, TAKE ONE TABLET BY MOUTH EVERY DAY, Disp: 90 tablet, Rfl: 0 .  MITIGARE 0.6 MG CAPS, Take 1 capsule by mouth 3 (three) times daily as needed., Disp: 30 capsule, Rfl: 2 .  multivitamin-iron-minerals-folic acid (CENTRUM) chewable tablet, Chew 1 tablet by mouth daily., Disp: , Rfl:  .  potassium chloride SA (K-DUR,KLOR-CON) 20 MEQ tablet, TAKE ONE TABLET BY MOUTH EVERY DAY, Disp: 90 tablet, Rfl: 0 .  traMADol (ULTRAM) 50 MG tablet, Take 1 tablet (50 mg total) by mouth every 6 (six) hours as needed., Disp: 20 tablet, Rfl: 0 .  clopidogrel (PLAVIX) 75 MG tablet, Take 1 tablet (75 mg total) by mouth daily., Disp: 90 tablet, Rfl: 0 .  losartan (COZAAR) 25 MG tablet, Take 1 tablet (25 mg total) by mouth in the morning., Disp: 90 tablet, Rfl: 3 .  sildenafil (REVATIO) 20 MG tablet, take 1 tablet by oral route every day as needed., Disp: , Rfl: 3  Orders Placed This Encounter  Procedures  . Basic metabolic panel  . Magnesium  . EKG 12-Lead  . PCV ECHOCARDIOGRAM COMPLETE   There are no Patient Instructions on file for this visit.   --Continue cardiac medications as reconciled in final medication list. --Return in about 4 weeks (around 11/10/2019) for CAD follow up . Or sooner if needed. --Continue follow-up with your primary care physician regarding the management of your other chronic comorbid conditions.  Patient's questions and concerns were addressed to his satisfaction. He voices understanding of the instructions provided during this encounter.   This note was created using a voice recognition software as a result there may be grammatical errors inadvertently enclosed that do not reflect the nature of this encounter. Every attempt is made to correct such errors.  Rex Kras, [place], [facility]  Pager: [phone] Office:  [phone]

## 2019-10-19 ENCOUNTER — Other Ambulatory Visit: Payer: Self-pay

## 2019-10-19 ENCOUNTER — Other Ambulatory Visit: Payer: Self-pay | Admitting: Cardiology

## 2019-10-19 DIAGNOSIS — I10 Essential (primary) hypertension: Secondary | ICD-10-CM

## 2019-10-19 NOTE — Progress Notes (Signed)
Ok Dr. Odis Hollingshead you can sign it now

## 2019-10-20 ENCOUNTER — Other Ambulatory Visit: Payer: Self-pay

## 2019-10-20 ENCOUNTER — Ambulatory Visit: Payer: 59

## 2019-10-20 DIAGNOSIS — I2510 Atherosclerotic heart disease of native coronary artery without angina pectoris: Secondary | ICD-10-CM

## 2019-10-20 LAB — BASIC METABOLIC PANEL
BUN/Creatinine Ratio: 18 (ref 10–24)
BUN: 25 mg/dL (ref 8–27)
CO2: 27 mmol/L (ref 20–29)
Calcium: 9.1 mg/dL (ref 8.6–10.2)
Chloride: 97 mmol/L (ref 96–106)
Creatinine, Ser: 1.41 mg/dL — ABNORMAL HIGH (ref 0.76–1.27)
GFR calc Af Amer: 60 mL/min/{1.73_m2} (ref >59)
GFR calc non Af Amer: 52 mL/min/{1.73_m2} — ABNORMAL LOW (ref >59)
Glucose: 96 mg/dL (ref 65–99)
Potassium: 3.9 mmol/L (ref 3.5–5.2)
Sodium: 139 mmol/L (ref 134–144)

## 2019-10-20 LAB — MAGNESIUM: Magnesium: 1.5 mg/dL — ABNORMAL LOW (ref 1.6–2.3)

## 2019-10-23 ENCOUNTER — Encounter: Payer: Self-pay | Admitting: Nurse Practitioner

## 2019-10-27 ENCOUNTER — Other Ambulatory Visit: Payer: Self-pay | Admitting: Cardiology

## 2019-10-27 DIAGNOSIS — I2510 Atherosclerotic heart disease of native coronary artery without angina pectoris: Secondary | ICD-10-CM

## 2019-10-27 MED ORDER — MAGNESIUM OXIDE 400 MG PO CAPS
400.0000 mg | ORAL_CAPSULE | Freq: Three times a day (TID) | ORAL | 2 refills | Status: AC
Start: 2019-10-27 — End: 2020-01-25

## 2019-11-07 LAB — MAGNESIUM: Magnesium: 2 mg/dL (ref 1.6–2.3)

## 2019-11-07 LAB — BASIC METABOLIC PANEL
BUN/Creatinine Ratio: 17 (ref 10–24)
BUN: 27 mg/dL (ref 8–27)
CO2: 26 mmol/L (ref 20–29)
Calcium: 8.9 mg/dL (ref 8.6–10.2)
Chloride: 94 mmol/L — ABNORMAL LOW (ref 96–106)
Creatinine, Ser: 1.6 mg/dL — ABNORMAL HIGH (ref 0.76–1.27)
GFR calc Af Amer: 52 mL/min/{1.73_m2} — ABNORMAL LOW (ref >59)
GFR calc non Af Amer: 45 mL/min/{1.73_m2} — ABNORMAL LOW (ref >59)
Glucose: 90 mg/dL (ref 65–99)
Potassium: 3.7 mmol/L (ref 3.5–5.2)
Sodium: 135 mmol/L (ref 134–144)

## 2019-11-10 ENCOUNTER — Other Ambulatory Visit: Payer: Self-pay | Admitting: Cardiology

## 2019-11-10 ENCOUNTER — Telehealth: Payer: Self-pay | Admitting: Cardiology

## 2019-11-10 DIAGNOSIS — N179 Acute kidney failure, unspecified: Secondary | ICD-10-CM

## 2019-11-10 NOTE — Telephone Encounter (Signed)
Patient had a repeat BMP performed which notes uptrending creatinine. Hold chlorthalidone for now for 1 week.   Repeat blood work in 1 week to reevaluate kidney function. Asked to keep an eye on shortness of breath and lower extremity swelling.  Tessa Lerner, [place], [facility]  Pager: [phone] Office: [phone]

## 2019-11-24 LAB — BASIC METABOLIC PANEL
BUN/Creatinine Ratio: 13 (ref 10–24)
BUN: 18 mg/dL (ref 8–27)
CO2: 28 mmol/L (ref 20–29)
Calcium: 9.3 mg/dL (ref 8.6–10.2)
Chloride: 104 mmol/L (ref 96–106)
Creatinine, Ser: 1.34 mg/dL — ABNORMAL HIGH (ref 0.76–1.27)
GFR calc Af Amer: 64 mL/min/{1.73_m2} (ref >59)
GFR calc non Af Amer: 56 mL/min/{1.73_m2} — ABNORMAL LOW (ref >59)
Glucose: 78 mg/dL (ref 65–99)
Potassium: 4.4 mmol/L (ref 3.5–5.2)
Sodium: 142 mmol/L (ref 134–144)

## 2019-11-24 LAB — MAGNESIUM: Magnesium: 1.8 mg/dL (ref 1.6–2.3)

## 2019-11-30 ENCOUNTER — Ambulatory Visit: Payer: 59 | Admitting: Cardiology

## 2019-11-30 ENCOUNTER — Other Ambulatory Visit: Payer: Self-pay

## 2019-11-30 ENCOUNTER — Encounter: Payer: Self-pay | Admitting: Cardiology

## 2019-11-30 VITALS — BP 125/79 | HR 59 | Resp 17 | Ht 67.0 in | Wt 191.0 lb

## 2019-11-30 DIAGNOSIS — E782 Mixed hyperlipidemia: Secondary | ICD-10-CM

## 2019-11-30 DIAGNOSIS — I10 Essential (primary) hypertension: Secondary | ICD-10-CM

## 2019-11-30 DIAGNOSIS — I2510 Atherosclerotic heart disease of native coronary artery without angina pectoris: Secondary | ICD-10-CM

## 2019-11-30 DIAGNOSIS — Z955 Presence of coronary angioplasty implant and graft: Secondary | ICD-10-CM

## 2019-11-30 DIAGNOSIS — Z8616 Personal history of COVID-19: Secondary | ICD-10-CM

## 2019-11-30 DIAGNOSIS — Z87891 Personal history of nicotine dependence: Secondary | ICD-10-CM

## 2019-11-30 DIAGNOSIS — Z712 Person consulting for explanation of examination or test findings: Secondary | ICD-10-CM

## 2019-11-30 DIAGNOSIS — Z8639 Personal history of other endocrine, nutritional and metabolic disease: Secondary | ICD-10-CM

## 2019-11-30 DIAGNOSIS — E119 Type 2 diabetes mellitus without complications: Secondary | ICD-10-CM

## 2019-12-03 ENCOUNTER — Encounter: Payer: Self-pay | Admitting: Cardiology

## 2019-12-03 NOTE — Progress Notes (Signed)
[name], Rowland [birth year], MRN [id number]  PCP:  Dorothyann Peng, MD  Cardiologist:  Tessa Lerner, DO, [facility] (established care 10/13/2019) Former Cardiology Providers: Dr. Yates Decamp and Dr. Jordan Hawks.   Date: 12/03/19 Last Office Visit: 10/13/2019  Chief Complaint  Patient presents with  . Coronary Artery Disease  . Follow-up    4 week    HPI  Thomas Rowland is a 64 y.o. 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  Patient was originally under the care of Dr. Yates Decamp and due to insurance issues had transitioned his care to [facility].  Since then patient has had chest pain concerning for anginal symptoms and underwent stress test which was reported to be abnormal.  He underwent elective angiography and was found to have obstructive CAD.  Based on the records obtained  he has had 2 stents in the RCA and one in the LCx.    Patient reestablish care in June 2021 for the management of coronary artery disease and hypertension per PCP.  At that time patient remained asymptomatic from anginal chest pain or equivalents.  Patient was transitioned to Plavix 75 mg p.o. daily and has tolerated the medication well.  He was also started on losartan 25 mg p.o. daily for blood pressure given his history of CAD and non-insulin-dependent diabetes mellitus.  Patient has tolerated the medication well.  Recent lab work shows stable kidney function.  Patient has also undergone echocardiogram his last office visit results were explained to him in great detail and findings noted below for further reference.  FUNCTIONAL STATUS: No structured exercise program or daily  routine. 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    ALLERGIES: Allergies  Allergen Reactions  . Lisinopril Cough    MEDICATION LIST PRIOR TO VISIT: Current Meds  Medication Sig  . allopurinol (ZYLOPRIM) 100 MG tablet Take 1.5 tablets (150 mg total) by mouth daily.  Marland Kitchen amLODipine (NORVASC) 10 MG tablet TAKE ONE TABLET BY MOUTH EVERY DAY  . aspirin 81 MG EC tablet Take 1 tablet by mouth daily.  Marland Kitchen atorvastatin (LIPITOR) 40 MG tablet   . clopidogrel (PLAVIX) 75 MG tablet Take 1 tablet (75 mg total) by mouth daily.  . diphenhydrAMINE (BENADRYL) 25 MG tablet Take 25 mg by mouth every 6 (six) hours as needed.  . ferrous sulfate 325 (65 FE) MG EC tablet Take 325 mg by mouth 3 (three) times daily with meals.  Marland Kitchen FLUZONE HIGH-DOSE 0.5 ML injection INJECT INTRAMUSCUALRY ONCE  . losartan (COZAAR) 25 MG tablet Take 1 tablet (25 mg total) by mouth in the morning.  . Magnesium Oxide 400 MG CAPS Take 1 capsule (400 mg total) by mouth in the morning, at noon, and at bedtime.  . metFORMIN (GLUCOPHAGE) 500 MG tablet Take 500 mg by mouth daily with breakfast.  . metoprolol succinate (TOPROL-XL) 100 MG 24 hr tablet TAKE ONE TABLET BY MOUTH EVERY DAY  . MITIGARE 0.6 MG CAPS Take 1 capsule by mouth 3 (three) times daily as needed.  . multivitamin-iron-minerals-folic acid (CENTRUM) chewable tablet Chew 1 tablet by mouth daily.  . potassium chloride SA (K-DUR,KLOR-CON) 20 MEQ tablet TAKE ONE TABLET BY MOUTH EVERY DAY     PAST MEDICAL HISTORY: Past Medical  History:  Diagnosis Date  . Coronary artery disease   . Diabetes mellitus without complication (HCC)   . History of COVID-19   . Hyperlipidemia   . Hypertension   . Nonspecific abnormal electrocardiogram (ECG) (EKG) 06/26/2013    PAST SURGICAL HISTORY: Past Surgical History:  Procedure Laterality Date  . COLONOSCOPY  2014   Normal  . CORONARY ANGIOPLASTY WITH STENT PLACEMENT    . LEG SURGERY Left 1997   [place]    FAMILY HISTORY: The patient family  history includes Hypertension in his sister.  SOCIAL HISTORY:  The patient  reports that he quit smoking about 18 years ago. His smoking use included cigarettes. He has a 7.50 pack-year smoking history. He has never used smokeless tobacco. He reports current alcohol use. He reports that he does not use drugs.  REVIEW OF SYSTEMS: Review of Systems  Constitutional: Negative for chills and fever.  HENT: Negative for hoarse voice and nosebleeds.   Eyes: Negative for discharge, double vision and pain.  Cardiovascular: Negative for chest pain, claudication, dyspnea on exertion, leg swelling, near-syncope, orthopnea, palpitations, paroxysmal nocturnal dyspnea and syncope.  Respiratory: Negative for hemoptysis and shortness of breath.   Musculoskeletal: Negative for muscle cramps and myalgias.  Gastrointestinal: Negative for abdominal pain, constipation, diarrhea, hematemesis, hematochezia, melena, nausea and vomiting.  Neurological: Negative for dizziness and light-headedness.    PHYSICAL EXAM: Vitals with BMI 11/30/2019 10/13/2019 10/02/2019  Height 5\' 7"  5\' 7"  5' 4.6"  Weight 191 lbs 180 lbs 185 lbs 6 oz  BMI 29.91 28.19 31.23  Systolic 125 131  Diastolic 79 84 60  Pulse 59 79 72   CONSTITUTIONAL: Well-developed and well-nourished. No acute distress.  SKIN: Skin is warm and dry. No rash noted. No cyanosis. No pallor. No jaundice HEAD: Normocephalic and atraumatic.  EYES: No scleral icterus MOUTH/THROAT: Moist oral membranes.  NECK: No JVD present. No thyromegaly noted. No carotid bruits  LYMPHATIC: No visible cervical adenopathy.  CHEST Normal respiratory effort. No intercostal retractions  LUNGS: Clear to auscultation bilaterally.  No stridor. No wheezes. No rales.  CARDIOVASCULAR: Regular, positive S1-S2, no murmurs rubs or gallops appreciated.  ABDOMINAL: Soft, nontender, nondistended, positive bowel sounds all 4 quadrants. No apparent ascites.  EXTREMITIES: No peripheral edema   HEMATOLOGIC: No significant bruising NEUROLOGIC: Oriented to person, place, and time. Nonfocal. Normal muscle tone.  PSYCHIATRIC: Normal mood and affect. Normal behavior. Cooperative  CARDIAC DATABASE: EKG: 10/13/2019: Normal sinus rhythm, 86 bpm, old inferior infarct, ST-T changes in the anterolateral.  Echocardiogram: 10/20/2019: LVEF 50-55%, moderate LVH, indeterminate diastolic filling pattern, mildly dilated left atrium, mild AR, mild MR, mild TR, mild PR.  Stress Testing: 06/15/2019 from care everywhere (per report):  1. Potential small area of pharmacologically induced ischemia involving the mid aspect of the inferior wall of the left ventricle.  2. No scintigraphic evidence of prior infarction.  3. Mild hypokinesia involving the inferior wall of the left ventricle.  4. Ejection fraction - 50%.  Heart Catheterization: 07/15/2018 LHC with Dr. 06/17/2019 at [facility] (care everywhere): "Angiography revealed mild LAD, 80% mLCx, 95% mRCA. Stented RCA with 3.5x18 and 3.5x12 Onyx DES with good angiographic result. Stented LCX with a 3.5x15 Onyx DES with good angiographic result."  LABORATORY DATA: CBC Latest Ref Rng & Units 08/08/2010  WBC 4.0 - 10.5 K/uL 10.5  Hemoglobin 13.0 - 17.0 g/dL [facility]  Hematocrit 39 - 52 % 38.7(L)  Platelets 150 - 400 K/uL 163    CMP Latest Ref Rng & Units  11/23/2019 11/06/2019 10/19/2019  Glucose 65 - 99 mg/dL 78 90 96  BUN 8 - 27 mg/dL 18 27 25   Creatinine 0.76 - 1.27 mg/dL 1.61(W1.34(H) 9.60(A1.60(H) 5.40(J1.41(H)  Sodium 134 - 144 mmol/L 142 135 139  Potassium 3.5 - 5.2 mmol/L 4.4 3.7 3.9  Chloride 96 - 106 mmol/L 104 94(L) 97  CO2 20 - 29 mmol/L 28 26 27   Calcium 8.6 - 10.2 mg/dL 9.3 8.9 9.1  Total Protein 6.0 - 8.5 g/dL - - -  Total Bilirubin 0.0 - 1.2 mg/dL - - -  Alkaline Phos 48 - 121 IU/L - - -  AST 0 - 40 IU/L - - -  ALT 0 - 44 IU/L - - -    Lipid Panel     Component Value Date/Time   CHOL 133 09/12/2019 1627   TRIG 79 09/12/2019 1627   HDL 50 09/12/2019 1627    CHOLHDL 2.7 09/12/2019 1627   LDLCALC 67 09/12/2019 1627   LABVLDL 16 09/12/2019 1627    No components found for: NTPROBNP No results for input(s): PROBNP in the last 8760 hours. No results for input(s): TSH in the last 8760 hours.  HEMOGLOBIN A1C Lab Results  Component Value Date   HGBA1C 5.8 (H) 09/12/2019    IMPRESSION:    ICD-10-CM   1. Atherosclerosis of native coronary artery of native heart without angina pectoris  I25.10   2. History of coronary angioplasty with insertion of stent  Z95.5   3. Encounter to discuss test results  Z71.2   4. Essential hypertension  I10   5. Mixed hyperlipidemia  E78.2   6. Former smoker  Z87.891   7. Non-insulin dependent type 2 diabetes mellitus (HCC)  E11.9   8. Hx of hyperaldosteronism  Z86.39   9. History of COVID-19  Z86.16      RECOMMENDATIONS: Verlee RossettiGaniyu Ojo-Amoo is a 64 y.o. 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  Atherosclerosis of the native coronary arteries without angina pectoris of prior stents:  Given the number of PCI's, his DAPT score, and low bleeding risk shared decision was to continue DAPT therapy with aspirin and Plavix.  Continue statin therapy.  Continue losartan 25 mg p.o. daily  Most recent echocardiogram results reviewed with the patient at today's office visit and explained to him in great detail.  History of coronary artery stents: Stable  Benign essential hypertension:  Medications reconciled.  Office blood pressure is currently at goal.  Educated on importance of a low-salt diet.  Given his underlying chronic kidney disease recommended to avoid nephrotoxic agents such as NSAIDs.  Mixed hyperlipidemia: Continue statin therapy.  He does not endorse myalgias.  Most recent lipid profile  reviewed.  Former smoker: Educated on the importance of continued smoking cessation.  Non-insulin-dependent diabetes: Currently managed by primary care provider.  Patient educated on importance of glycemic control given his underlying CAD.  FINAL MEDICATION LIST END OF ENCOUNTER: No orders of the defined types were placed in this encounter.    Current Outpatient Medications:  .  allopurinol (ZYLOPRIM) 100 MG tablet, Take 1.5 tablets (150 mg total) by mouth daily., Disp: 135 tablet, Rfl: 1 .  amLODipine (NORVASC) 10 MG tablet, TAKE ONE TABLET BY MOUTH EVERY DAY, Disp: 90 tablet, Rfl: 0 .  aspirin 81 MG EC tablet, Take 1 tablet by mouth daily., Disp: , Rfl:  .  atorvastatin (LIPITOR) 40 MG tablet, , Disp: , Rfl: 0 .  clopidogrel (PLAVIX) 75 MG tablet, Take 1 tablet (75 mg total) by mouth daily., Disp: 90 tablet, Rfl: 0 .  diphenhydrAMINE (BENADRYL) 25 MG tablet, Take 25 mg by mouth every 6 (six) hours as needed., Disp: , Rfl:  .  ferrous sulfate 325 (65 FE) MG EC tablet, Take 325 mg by mouth 3 (three) times daily with meals., Disp: , Rfl:  .  FLUZONE HIGH-DOSE 0.5 ML injection, INJECT INTRAMUSCUALRY ONCE, Disp: , Rfl: 0 .  losartan (COZAAR) 25 MG tablet, Take 1 tablet (25 mg total) by mouth in the morning., Disp: 90 tablet, Rfl: 3 .  Magnesium Oxide 400 MG CAPS, Take 1 capsule (400 mg total) by mouth in the morning, at noon, and at bedtime., Disp: 90 capsule, Rfl: 2 .  metFORMIN (GLUCOPHAGE) 500 MG tablet, Take 500 mg by mouth daily with breakfast., Disp: , Rfl:  .  metoprolol succinate (TOPROL-XL) 100 MG 24 hr tablet, TAKE ONE TABLET BY MOUTH EVERY DAY, Disp: 90 tablet, Rfl: 0 .  MITIGARE 0.6 MG CAPS, Take 1 capsule by mouth 3 (three) times daily as needed., Disp: 30 capsule, Rfl: 2 .  multivitamin-iron-minerals-folic acid (CENTRUM) chewable tablet, Chew 1 tablet by mouth daily., Disp: , Rfl:  .  potassium chloride SA (K-DUR,KLOR-CON) 20 MEQ tablet, TAKE ONE TABLET BY MOUTH EVERY DAY, Disp:  90 tablet, Rfl: 0 .  dorzolamide-timolol (COSOPT) 22.3-6.8 MG/ML ophthalmic solution, Place 2 drops into both eyes daily. (Patient not taking: Reported on 11/30/2019), Disp: , Rfl:  .  latanoprost (XALATAN) 0.005 % ophthalmic solution, Place 1 drop into both eyes daily. (Patient not taking: Reported on 11/30/2019), Disp: , Rfl:  .  sildenafil (REVATIO) 20 MG tablet, take 1 tablet by oral route every day as needed., Disp: , Rfl: 3 .  traMADol (ULTRAM) 50 MG tablet, Take 1 tablet (50 mg total) by mouth every 6 (six) hours as needed. (Patient not taking: Reported on 11/30/2019), Disp: 20 tablet, Rfl: 0  No orders of the defined types were placed in this encounter.  There are no Patient Instructions on file for this visit.   --Continue cardiac medications as reconciled in final medication list. --Return in about 6 months (around 06/01/2020) for CAD follow up.. Or sooner if needed. --Continue follow-up with your primary care physician regarding the management of your other chronic comorbid conditions.  Patient's questions and concerns were addressed to his satisfaction. He voices understanding of the instructions provided during this encounter.   This note was created using a voice recognition software as a result there may be grammatical errors inadvertently enclosed that do not reflect the nature of this encounter. Every attempt is made to correct such errors.  Tessa Lerner, [place], [facility]  Pager: [phone] Office: [phone]

## 2019-12-12 ENCOUNTER — Telehealth: Payer: Self-pay

## 2019-12-12 NOTE — Telephone Encounter (Signed)
Pt good friend called asking if pt should still take the mg 400. Per Dr. Odis Hollingshead pt should continue with mg 400 mg TIB.

## 2019-12-13 ENCOUNTER — Ambulatory Visit: Payer: 59 | Admitting: Internal Medicine

## 2019-12-13 ENCOUNTER — Other Ambulatory Visit: Payer: Self-pay

## 2019-12-13 ENCOUNTER — Encounter: Payer: Self-pay | Admitting: Internal Medicine

## 2019-12-13 VITALS — BP 132/70 | HR 53 | Temp 98.6°F | Ht 64.4 in | Wt 186.8 lb

## 2019-12-13 DIAGNOSIS — I2089 Other forms of angina pectoris: Secondary | ICD-10-CM

## 2019-12-13 DIAGNOSIS — E119 Type 2 diabetes mellitus without complications: Secondary | ICD-10-CM

## 2019-12-13 DIAGNOSIS — I208 Other forms of angina pectoris: Secondary | ICD-10-CM | POA: Diagnosis not present

## 2019-12-13 DIAGNOSIS — M1A0410 Idiopathic chronic gout, right hand, without tophus (tophi): Secondary | ICD-10-CM

## 2019-12-13 DIAGNOSIS — R7309 Other abnormal glucose: Secondary | ICD-10-CM

## 2019-12-13 DIAGNOSIS — I10 Essential (primary) hypertension: Secondary | ICD-10-CM | POA: Diagnosis not present

## 2019-12-13 DIAGNOSIS — Z8616 Personal history of COVID-19: Secondary | ICD-10-CM

## 2019-12-13 DIAGNOSIS — E261 Secondary hyperaldosteronism: Secondary | ICD-10-CM

## 2019-12-13 DIAGNOSIS — E559 Vitamin D deficiency, unspecified: Secondary | ICD-10-CM

## 2019-12-13 DIAGNOSIS — Z6831 Body mass index (BMI) 31.0-31.9, adult: Secondary | ICD-10-CM

## 2019-12-13 DIAGNOSIS — E6609 Other obesity due to excess calories: Secondary | ICD-10-CM

## 2019-12-13 NOTE — Patient Instructions (Signed)

## 2019-12-13 NOTE — Progress Notes (Signed)
I,[name],acting as a Neurosurgeon for Gwynneth Aliment, MD.,have documented all relevant documentation on the behalf of Gwynneth Aliment, MD,as directed by  Gwynneth Aliment, MD while in the presence of Gwynneth Aliment, MD.  This visit occurred during the SARS-CoV-2 public health emergency.  Safety protocols were in place, including screening questions prior to the visit, additional usage of staff PPE, and extensive cleaning of exam room while observing appropriate contact time as indicated for disinfecting solutions.  Subjective:     Patient ID: Thomas Rowland , male    DOB: October 05, 1955 , 63 y.o.   MRN: [id number]   Chief Complaint  Patient presents with  . Diabetes  . Hypertension    HPI  Pt presents for dm/htn check. He states that he is compliant with medication.      Hypertension This is a chronic problem. The current episode started more than 1 year ago. The problem has been gradually worsening since onset. The problem is uncontrolled. Pertinent negatives include no anxiety, blurred vision, chest pain, headaches or palpitations. There are no associated agents to hypertension. Risk factors for coronary artery disease include obesity and diabetes mellitus. Past treatments include diuretics. The current treatment provides no improvement. There are no compliance problems.  There is no history of angina.  Diabetes He presents for his follow-up diabetic visit. He has type 2 diabetes mellitus. There are no hypoglycemic associated symptoms. Pertinent negatives for hypoglycemia include no dizziness or headaches. Pertinent negatives for diabetes include no blurred vision, no chest pain, no fatigue, no polydipsia, no polyphagia and no polyuria. Symptoms are stable.  Arm Pain  Pertinent negatives include no chest pain.     Past Medical History:  Diagnosis Date  . Coronary artery disease   . Diabetes mellitus without complication (HCC)   . History of COVID-19   . Hyperlipidemia   .  Hypertension   . Nonspecific abnormal electrocardiogram (ECG) (EKG) 06/26/2013     Family History  Problem Relation Age of Onset  . Hypertension Sister      Current Outpatient Medications:  .  allopurinol (ZYLOPRIM) 100 MG tablet, Take 1.5 tablets (150 mg total) by mouth daily., Disp: 135 tablet, Rfl: 1 .  amLODipine (NORVASC) 10 MG tablet, TAKE ONE TABLET BY MOUTH EVERY DAY, Disp: 90 tablet, Rfl: 0 .  aspirin 81 MG EC tablet, Take 1 tablet by mouth daily., Disp: , Rfl:  .  atorvastatin (LIPITOR) 40 MG tablet, , Disp: , Rfl: 0 .  clopidogrel (PLAVIX) 75 MG tablet, Take 1 tablet (75 mg total) by mouth daily., Disp: 90 tablet, Rfl: 0 .  diphenhydrAMINE (BENADRYL) 25 MG tablet, Take 25 mg by mouth every 6 (six) hours as needed., Disp: , Rfl:  .  ferrous sulfate 325 (65 FE) MG EC tablet, Take 325 mg by mouth 3 (three) times daily with meals., Disp: , Rfl:  .  FLUZONE HIGH-DOSE 0.5 ML injection, INJECT INTRAMUSCUALRY ONCE, Disp: , Rfl: 0 .  latanoprost (XALATAN) 0.005 % ophthalmic solution, Place 1 drop into both eyes daily. , Disp: , Rfl:  .  Magnesium Oxide 400 MG CAPS, Take 1 capsule (400 mg total) by mouth in the morning, at noon, and at bedtime., Disp: 90 capsule, Rfl: 2 .  metFORMIN (GLUCOPHAGE) 500 MG tablet, Take 500 mg by mouth daily with breakfast., Disp: , Rfl:  .  metoprolol succinate (TOPROL-XL) 100 MG 24 hr tablet, TAKE ONE TABLET BY MOUTH EVERY DAY, Disp: 90 tablet, Rfl: 0 .  MITIGARE  0.6 MG CAPS, Take 1 capsule by mouth 3 (three) times daily as needed., Disp: 30 capsule, Rfl: 2 .  multivitamin-iron-minerals-folic acid (CENTRUM) chewable tablet, Chew 1 tablet by mouth daily., Disp: , Rfl:  .  potassium chloride SA (K-DUR,KLOR-CON) 20 MEQ tablet, TAKE ONE TABLET BY MOUTH EVERY DAY, Disp: 90 tablet, Rfl: 0 .  sildenafil (REVATIO) 20 MG tablet, take 1 tablet by oral route every day as needed., Disp: , Rfl: 3 .  dorzolamide-timolol (COSOPT) 22.3-6.8 MG/ML ophthalmic solution, Place 2  drops into both eyes daily. (Patient not taking: Reported on 11/30/2019), Disp: , Rfl:  .  losartan (COZAAR) 25 MG tablet, Take 1 tablet (25 mg total) by mouth in the morning. (Patient not taking: Reported on 12/12/2019), Disp: 90 tablet, Rfl: 3 .  traMADol (ULTRAM) 50 MG tablet, Take 1 tablet (50 mg total) by mouth every 6 (six) hours as needed. (Patient not taking: Reported on 11/30/2019), Disp: 20 tablet, Rfl: 0   Allergies  Allergen Reactions  . Lisinopril Cough     Review of Systems  Constitutional: Negative.  Negative for fatigue.  Eyes: Negative for blurred vision.  Respiratory: Negative.   Cardiovascular: Negative.  Negative for chest pain and palpitations.  Gastrointestinal: Negative.   Endocrine: Negative for polydipsia, polyphagia and polyuria.  Neurological: Negative.  Negative for dizziness and headaches.     Today's Vitals   12/13/19 1546  BP: 132/70  Pulse: (!) 53  Temp: 98.6 F (37 C)  TempSrc: Oral  Weight: 186 lb 12.8 oz (84.7 kg)  Height: 5' 4.4" (1.636 m)   Body mass index is 31.67 kg/m.   Objective:  Physical Exam Vitals and nursing note reviewed.  Constitutional:      Appearance: Normal appearance. He is obese.  Cardiovascular:     Rate and Rhythm: Normal rate and regular rhythm.     Heart sounds: Normal heart sounds.  Pulmonary:     Effort: Pulmonary effort is normal.     Breath sounds: Normal breath sounds.  Skin:    General: Skin is warm.  Neurological:     General: No focal deficit present.     Mental Status: He is alert.  Psychiatric:        Mood and Affect: Mood normal.         Assessment And Plan:     1. Essential hypertension Comments: Chronic, fair control. He will continue with current meds. He is encouraged to avoid adding salt to his foods.  - TSH  2. Other abnormal glucose  HER A1C HAS BEEN ELEVATED IN THE PAST. I WILL CHECK AN A1C, BMET TODAY. SHE WAS ENCOURAGED TO AVOID SUGARY BEVERAGES AND PROCESSED FOODS INCLUDNG BREADS,  RICE AND PASTA.  - Hgb A1c w/o eAG  3. Apparent mineralocorticoid excess (HCC) Comments: Chronic, he will continue with current meds.   4. Stable angina (HCC) Comments: Chronic, yet stable. He is also followed by Cardiology and encouraged to follow a heart healthy lifestyle.   5. Vitamin D deficiency disease Comments: I will check vitamin D level and supplement as needed.  - Vitamin D (25 hydroxy)  6. 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 I will check uric acid level today.  - Uric acid  7. Class 1 obesity due to excess calories with serious comorbidity and body mass index (BMI) of 31.0 to 31.9 in adult Comments: He is encouraged to strive for BMI less than 30 to decrease cardiac risk. Advised to aim  for at least 150 minutes exercise/week.   8. Personal history of covid-19  He is encouraged to initially strive for BMI less than 30 to decrease cardiac risk. He is advised to exercise no less than 150 minutes per week.  Wt Readings from Last 3 Encounters:  12/13/19 186 lb 12.8 oz (84.7 kg)  11/30/19 191 lb (86.6 kg)  10/13/19 180 lb (81.6 kg)      Patient was given opportunity to ask questions. Patient verbalized understanding of the plan and was able to repeat key elements of the plan. All questions were answered to their satisfaction.  Gwynneth Aliment, MD   I, Gwynneth Aliment, MD, have reviewed all documentation for this visit. The documentation on 12/25/19 for the exam, diagnosis, procedures, and orders are all accurate and complete.  THE PATIENT IS ENCOURAGED TO PRACTICE SOCIAL DISTANCING DUE TO THE COVID-19 PANDEMIC.

## 2019-12-14 LAB — URIC ACID: Uric Acid: 6.1 mg/dL (ref 3.8–8.4)

## 2019-12-14 LAB — HGB A1C W/O EAG: Hgb A1c MFr Bld: 5.4 % (ref 4.8–5.6)

## 2019-12-14 LAB — TSH: TSH: 1.59 u[IU]/mL (ref 0.450–4.500)

## 2019-12-14 LAB — VITAMIN D 25 HYDROXY (VIT D DEFICIENCY, FRACTURES): Vit D, 25-Hydroxy: 31.6 ng/mL (ref 30.0–100.0)

## 2020-01-12 ENCOUNTER — Other Ambulatory Visit: Payer: Self-pay | Admitting: Cardiology

## 2020-01-12 DIAGNOSIS — I2510 Atherosclerotic heart disease of native coronary artery without angina pectoris: Secondary | ICD-10-CM

## 2020-01-12 DIAGNOSIS — Z955 Presence of coronary angioplasty implant and graft: Secondary | ICD-10-CM

## 2020-02-02 ENCOUNTER — Encounter (HOSPITAL_COMMUNITY): Payer: Self-pay | Admitting: Emergency Medicine

## 2020-02-02 ENCOUNTER — Other Ambulatory Visit: Payer: Self-pay

## 2020-02-02 ENCOUNTER — Ambulatory Visit (INDEPENDENT_AMBULATORY_CARE_PROVIDER_SITE_OTHER): Payer: 59

## 2020-02-02 ENCOUNTER — Ambulatory Visit (HOSPITAL_COMMUNITY)
Admission: EM | Admit: 2020-02-02 | Discharge: 2020-02-02 | Disposition: A | Discharge status: Home / Self Care | Payer: 59 | Attending: Internal Medicine | Admitting: Internal Medicine

## 2020-02-02 DIAGNOSIS — M79641 Pain in right hand: Secondary | ICD-10-CM

## 2020-02-02 DIAGNOSIS — L03113 Cellulitis of right upper limb: Secondary | ICD-10-CM | POA: Diagnosis not present

## 2020-02-02 MED ORDER — SULFAMETHOXAZOLE-TRIMETHOPRIM 800-160 MG PO TABS: 1.0000 | ORAL_TABLET | Freq: Two times a day (BID) | ORAL | 0 refills | Status: AC

## 2020-02-02 NOTE — ED Provider Notes (Signed)
[facility]    CSN: [id number] Arrival date & time: 02/02/20  1656      History   Chief Complaint Chief Complaint  Patient presents with  . Hand Pain    HPI Thomas Rowland is a 64 y.o. male with PMH of CAD, DM2, hypertension, hyperlipidemia presents to urgent care with complaints of right hand pain.  Patient states he awoke 6 to 7 days ago with pain in his right thumb that has progressed over the last 3 days to swelling and redness to right hand.  Patient denies any injury or trauma to affected extremity.  He denies any recent fever or chills.  Past Medical History:  Diagnosis Date  . Coronary artery disease   . Diabetes mellitus without complication (HCC)   . History of COVID-19   . Hyperlipidemia   . Hypertension   . Nonspecific abnormal electrocardiogram (ECG) (EKG) 06/26/2013    Patient Active Problem List   Diagnosis Date Noted  . Stable angina (HCC) 12/13/2019  . Hypertension 06/26/2013  . OSA (obstructive sleep apnea) 06/26/2013  . Nonspecific abnormal electrocardiogram (ECG) (EKG) 06/26/2013    Past Surgical History:  Procedure Laterality Date  . COLONOSCOPY  2014   Normal  . CORONARY ANGIOPLASTY WITH STENT PLACEMENT    . LEG SURGERY Left 1997   [place]       Home Medications    Prior to Admission medications   Medication Sig Start Date End Date Taking? Authorizing Provider  allopurinol (ZYLOPRIM) 100 MG tablet Take 1.5 tablets (150 mg total) by mouth daily. 10/02/19   Arnette Felts, FNP  amLODipine (NORVASC) 10 MG tablet TAKE ONE TABLET BY MOUTH EVERY DAY 04/15/18   Dorothyann Peng, MD  aspirin 81 MG EC tablet Take 1 tablet by mouth daily.    [provider]  atorvastatin (LIPITOR) 40 MG tablet  04/06/17   [provider]  clopidogrel (PLAVIX) 75 MG tablet Take 1 tablet (75 mg total) by mouth daily. 01/12/20 04/11/20  Tolia, Sunit, DO  diphenhydrAMINE (BENADRYL) 25 MG tablet Take 25 mg by mouth every 6 (six) hours as needed.     [provider]  dorzolamide-timolol (COSOPT) 22.3-6.8 MG/ML ophthalmic solution Place 2 drops into both eyes daily. Patient not taking: Reported on 11/30/2019 05/06/13   [provider]  ferrous sulfate 325 (65 FE) MG EC tablet Take 325 mg by mouth 3 (three) times daily with meals.    [provider]  FLUZONE HIGH-DOSE 0.5 ML injection INJECT INTRAMUSCUALRY ONCE 03/01/17   [provider]  latanoprost (XALATAN) 0.005 % ophthalmic solution Place 1 drop into both eyes daily.  04/30/13   [provider]  losartan (COZAAR) 25 MG tablet Take 1 tablet (25 mg total) by mouth in the morning. Patient not taking: Reported on 12/12/2019 10/13/19 01/11/20  Odis Hollingshead, Sunit, DO  metFORMIN (GLUCOPHAGE) 500 MG tablet Take 500 mg by mouth daily with breakfast.    [provider]  metoprolol succinate (TOPROL-XL) 100 MG 24 hr tablet TAKE ONE TABLET BY MOUTH EVERY DAY 04/15/18   Dorothyann Peng, MD  MITIGARE 0.6 MG CAPS Take 1 capsule by mouth 3 (three) times daily as needed. 09/13/19   Arnette Felts, FNP  multivitamin-iron-minerals-folic acid (CENTRUM) chewable tablet Chew 1 tablet by mouth daily.    [provider]  potassium chloride SA (K-DUR,KLOR-CON) 20 MEQ tablet TAKE ONE TABLET BY MOUTH EVERY DAY 04/15/18   Dorothyann Peng, MD  sildenafil (REVATIO) 20 MG tablet take 1 tablet by  oral route every day as needed. 04/01/17   [provider]  sulfamethoxazole-trimethoprim (BACTRIM DS) 800-160 MG tablet Take 1 tablet by mouth 2 (two) times daily for 10 days. 02/02/20 02/12/20  Rolla Etienne, NP  traMADol (ULTRAM) 50 MG tablet Take 1 tablet (50 mg total) by mouth every 6 (six) hours as needed. Patient not taking: Reported on 11/30/2019 09/13/19 09/12/20  Arnette Felts, FNP    Family History Family History  Problem Relation Age of Onset  . Hypertension Sister     Social History Social History   Tobacco Use  . Smoking status: Former Smoker    Packs/day:  0.25    Years: 30.00    Pack years: 7.50    Types: Cigarettes    Quit date: 06/26/2001    Years since quitting: 18.6  . Smokeless tobacco: Never Used  Vaping Use  . Vaping Use: Never used  Substance Use Topics  . Alcohol use: Yes  . Drug use: No     Allergies   Lisinopril   Review of Systems As stated in HPI otherwise negative   Physical Exam Triage Vital Signs ED Triage Vitals  Enc Vitals Group     BP 02/02/20 1749 (!) 156/82     Pulse Rate 02/02/20 1749 (!) 57     Resp 02/02/20 1749 17     Temp 02/02/20 1749 98.2 F (36.8 C)     Temp Source 02/02/20 1749 Oral     SpO2 02/02/20 1749 97 %     Weight --      Height --      Head Circumference --      Peak Flow --      Pain Score 02/02/20 1747 8     Pain Loc --      Pain Edu? --      Excl. in GC? --    No data found.  Updated Vital Signs BP (!) 156/82 (BP Location: Left Arm)   Pulse (!) 57   Temp 98.2 F (36.8 C) (Oral)   Resp 17   SpO2 97%   Visual Acuity Right Eye Distance:   Left Eye Distance:   Bilateral Distance:    Right Eye Near:   Left Eye Near:    Bilateral Near:     Physical Exam Constitutional:      General: He is not in acute distress.    Appearance: Normal appearance. He is not ill-appearing or toxic-appearing.  Musculoskeletal:        General: Swelling and tenderness present.  Skin:    General: Skin is warm and dry.     Comments: Patient with swelling and erythema to dorsum of right hand with feeling of fluctuance between first and second digit on dorsum of hand.  Sensation intact, normal range of motion, 2+ radial pulse.  No suspicious lesions or wounds  Neurological:     Mental Status: He is alert.     Sensory: No sensory deficit.  Psychiatric:        Mood and Affect: Mood normal.        Behavior: Behavior normal.        Thought Content: Thought content normal.      UC Treatments / Results  Labs (all labs ordered are listed, but only abnormal results are displayed) Labs  Reviewed - No data to display  EKG   Radiology DG Hand Complete Right  Result Date: 02/02/2020 CLINICAL DATA:  64 year old male with right hand pain and swelling.  No known injury. EXAM: RIGHT HAND - COMPLETE 3+ VIEW COMPARISON:  None. FINDINGS: There is no acute fracture or dislocation. The bones are well mineralized. No arthritic changes. There is soft tissue swelling of the hand. No radiopaque foreign object or soft tissue gas. IMPRESSION: 1. No acute osseous pathology. 2. Soft tissue swelling of the hand may represent cellulitis. Clinical correlation is recommended. Electronically Signed   By: Elgie Collard M.D.   On: 02/02/2020 19:16    Procedures Procedures (including critical care time)  Medications Ordered in UC Medications - No data to display  Initial Impression / Assessment and Plan / UC Course  I have reviewed the triage vital signs and the nursing notes.  Pertinent labs & imaging results that were available during my care of the patient were reviewed by me and considered in my medical decision making (see chart for details).  Cellulitis, right hand -Suspicion for developing abscess.  With no recent fever or chills and no evidence of osteomyelitis on plain film will treat outpatient -Bactrim p.o. twice daily x10 days.  Patient instructed to begin antibiotics this evening -Patient instructed to go to emergency department for any worsening swelling, pain or fever -He is to call PCP on Monday to arrange close follow-up -Patient verifies understanding  Reviewed expections re: course of current medical issues. Questions answered. Outlined signs and symptoms indicating need for more acute intervention. Pt verbalized understanding. AVS given   Final Clinical Impressions(s) / UC Diagnoses   Final diagnoses:  Cellulitis of right upper extremity     Discharge Instructions     It seems you have an infection in your hand.  I am prescribing you antibiotics that I want you  to start tonight.  If you should develop any fever, chills or worsening pain and swelling you need to go to the emergency department.    ED Prescriptions    Medication Sig Dispense Auth. Provider   sulfamethoxazole-trimethoprim (BACTRIM DS) 800-160 MG tablet Take 1 tablet by mouth 2 (two) times daily for 10 days. 20 tablet Rolla Etienne, NP     PDMP not reviewed this encounter.   Rolla Etienne, NP 02/02/20 2111

## 2020-02-02 NOTE — ED Triage Notes (Signed)
Pt presents with thumb and right hand pain xs 6 day. Denies any fall or injury.

## 2020-02-02 NOTE — Discharge Instructions (Signed)
It seems you have an infection in your hand.  I am prescribing you antibiotics that I want you to start tonight.  If you should develop any fever, chills or worsening pain and swelling you need to go to the emergency department.

## 2020-02-14 ENCOUNTER — Ambulatory Visit: Payer: 59 | Admitting: Internal Medicine

## 2020-02-14 ENCOUNTER — Encounter: Payer: Self-pay | Admitting: Internal Medicine

## 2020-02-14 ENCOUNTER — Other Ambulatory Visit: Payer: Self-pay

## 2020-02-14 VITALS — BP 120/70 | HR 56 | Temp 99.1°F | Ht 64.4 in | Wt 187.0 lb

## 2020-02-14 DIAGNOSIS — Z79899 Other long term (current) drug therapy: Secondary | ICD-10-CM

## 2020-02-14 DIAGNOSIS — E559 Vitamin D deficiency, unspecified: Secondary | ICD-10-CM

## 2020-02-14 DIAGNOSIS — M25521 Pain in right elbow: Secondary | ICD-10-CM | POA: Diagnosis not present

## 2020-02-14 DIAGNOSIS — M79641 Pain in right hand: Secondary | ICD-10-CM | POA: Diagnosis not present

## 2020-02-14 DIAGNOSIS — R7309 Other abnormal glucose: Secondary | ICD-10-CM

## 2020-02-14 DIAGNOSIS — L03011 Cellulitis of right finger: Secondary | ICD-10-CM

## 2020-02-14 MED ORDER — TRAMADOL HCL 50 MG PO TABS: 50.0000 mg | ORAL_TABLET | Freq: Four times a day (QID) | ORAL | 0 refills | Status: DC | PRN

## 2020-02-14 MED ORDER — CEFTRIAXONE SODIUM 500 MG IJ SOLR
500.0000 mg | Freq: Once | INTRAMUSCULAR | Status: AC
  Administered 2020-02-14: 500 mg via INTRAMUSCULAR

## 2020-02-14 NOTE — Progress Notes (Signed)
This visit occurred during the SARS-CoV-2 public health emergency.  Safety protocols were in place, including screening questions prior to the visit, additional usage of staff PPE, and extensive cleaning of exam room while observing appropriate contact time as indicated for disinfecting solutions.  Subjective:     Patient ID: Thomas Rowland , male    DOB: 04/06/1956 , 64 y.o.   MRN: [id number]   Chief Complaint  Patient presents with  . Hand Pain    HPI  The patient is for swelling in his right hand.  He denies fall/trauma.  He is not sure what triggered his sx. He has difficulty making a fist due to the swelling. He states it does not feel warm to touch. He wants to check to see if it is gout causing his sx. After review of his chart, he was seen on 10/8 for cellulitis of r hand. He neglected to mention this initially. He has completed full round of abx.     Past Medical History:  Diagnosis Date  . Coronary artery disease   . Diabetes mellitus without complication ([place])   . History of COVID-19   . Hyperlipidemia   . Hypertension   . Nonspecific abnormal electrocardiogram (ECG) (EKG) 06/26/2013     Family History  Problem Relation Age of Onset  . Hypertension Sister      Current Outpatient Medications:  .  allopurinol (ZYLOPRIM) 100 MG tablet, Take 1.5 tablets (150 mg total) by mouth daily., Disp: 135 tablet, Rfl: 1 .  amLODipine (NORVASC) 10 MG tablet, TAKE ONE TABLET BY MOUTH EVERY DAY, Disp: 90 tablet, Rfl: 0 .  aspirin 81 MG EC tablet, Take 1 tablet by mouth daily., Disp: , Rfl:  .  atorvastatin (LIPITOR) 40 MG tablet, , Disp: , Rfl: 0 .  clopidogrel (PLAVIX) 75 MG tablet, Take 1 tablet (75 mg total) by mouth daily., Disp: 90 tablet, Rfl: 3 .  losartan (COZAAR) 25 MG tablet, Take 1 tablet (25 mg total) by mouth in the morning., Disp: 90 tablet, Rfl: 3 .  magnesium oxide (MAG-OX) 400 MG tablet, Take 400 mg by mouth in the morning, at noon, and at bedtime., Disp: , Rfl:  .   metFORMIN (GLUCOPHAGE) 500 MG tablet, Take 500 mg by mouth daily with breakfast., Disp: , Rfl:  .  metoprolol succinate (TOPROL-XL) 100 MG 24 hr tablet, TAKE ONE TABLET BY MOUTH EVERY DAY, Disp: 90 tablet, Rfl: 0 .  multivitamin-iron-minerals-folic acid (CENTRUM) chewable tablet, Chew 1 tablet by mouth daily., Disp: , Rfl:  .  potassium chloride SA (K-DUR,KLOR-CON) 20 MEQ tablet, TAKE ONE TABLET BY MOUTH EVERY DAY, Disp: 90 tablet, Rfl: 0 .  diphenhydrAMINE (BENADRYL) 25 MG tablet, Take 25 mg by mouth every 6 (six) hours as needed. (Patient not taking: Reported on 02/14/2020), Disp: , Rfl:  .  dorzolamide-timolol (COSOPT) 22.3-6.8 MG/ML ophthalmic solution, Place 2 drops into both eyes daily. (Patient not taking: Reported on 11/30/2019), Disp: , Rfl:  .  ferrous sulfate 325 (65 FE) MG EC tablet, Take 325 mg by mouth 3 (three) times daily with meals. (Patient not taking: Reported on 02/14/2020), Disp: , Rfl:  .  FLUZONE HIGH-DOSE 0.5 ML injection, INJECT INTRAMUSCUALRY ONCE (Patient not taking: Reported on 02/14/2020), Disp: , Rfl: 0 .  latanoprost (XALATAN) 0.005 % ophthalmic solution, Place 1 drop into both eyes daily.  (Patient not taking: Reported on 02/14/2020), Disp: , Rfl:  .  MITIGARE 0.6 MG CAPS, Take 1 capsule by mouth 3 (three) times daily  as needed., Disp: 30 capsule, Rfl: 2 .  sildenafil (REVATIO) 20 MG tablet, take 1 tablet by oral route every day as needed. (Patient not taking: Reported on 02/14/2020), Disp: , Rfl: 3 .  traMADol (ULTRAM) 50 MG tablet, Take 1 tablet (50 mg total) by mouth every 6 (six) hours as needed., Disp: 20 tablet, Rfl: 0   Allergies  Allergen Reactions  . Lisinopril Cough     Review of Systems  Constitutional: Negative.   Respiratory: Negative.   Cardiovascular: Negative.   Gastrointestinal: Negative.   Musculoskeletal: Positive for arthralgias and joint swelling (thumb on right hand).       Right hand pain  Psychiatric/Behavioral: Negative.   All other  systems reviewed and are negative.    Today's Vitals   02/14/20 1645  BP: 120/70  Pulse: (!) 56  Temp: 99.1 F (37.3 C)  TempSrc: Oral  Weight: 187 lb (84.8 kg)  Height: 5' 4.4" (1.636 m)  PainSc: 6   PainLoc: Hand   Body mass index is 31.7 kg/m.  Wt Readings from Last 3 Encounters:  02/14/20 187 lb (84.8 kg)  12/13/19 186 lb 12.8 oz (84.7 kg)  11/30/19 191 lb (86.6 kg)   Objective:  Physical Exam Vitals and nursing note reviewed.  Constitutional:      Appearance: Normal appearance. He is obese.  HENT:     Head: Normocephalic and atraumatic.  Cardiovascular:     Rate and Rhythm: Normal rate and regular rhythm.     Heart sounds: Normal heart sounds.  Pulmonary:     Breath sounds: Normal breath sounds.  Musculoskeletal:        General: Swelling and tenderness present.     Cervical back: Normal range of motion.     Comments: Right hand swelling, no erythema. No tophi noted  Skin:    General: Skin is warm.  Neurological:     General: No focal deficit present.     Mental Status: He is alert and oriented to person, place, and time.         Assessment And Plan:     1. Cellulitis of finger of right hand Comments: He was given Rocephin, Im x 1. He has already completed a round of abx from Urgent Care.  - cefTRIAXone (ROCEPHIN) injection 500 mg  2. Right hand pain Comments: He was given rx tramadol to use prn. If persistent, he agrees to referral to hand specialist for further evaluation.  - Uric acid  3. Right elbow pain Comments: Please see #2. Encouraged to get elbow sleeve to wear while working. Advised he can get this over the counter.  - traMADol (ULTRAM) 50 MG tablet; Take 1 tablet (50 mg total) by mouth every 6 (six) hours as needed.  Dispense: 20 tablet; Refill: 0  4. Vitamin D deficiency disease Comments: He will continue with vitamin D 2000 units daily.   5. Other abnormal glucose Comments: I will check BMP and insulin level today. Encouraged to limit  his intake of sugary drinks and refined carbs like white rice, pasta and breads.  - Insulin, random(561)  6. Drug therapy - BMP8+EGFR     Patient was given opportunity to ask questions. Patient verbalized understanding of the plan and was able to repeat key elements of the plan. All questions were answered to their satisfaction.  Gwynneth Aliment, MD   I, Gwynneth Aliment, MD, have reviewed all documentation for this visit. The documentation on 02/29/20 for the exam, diagnosis, procedures, and  orders are all accurate and complete.  THE PATIENT IS ENCOURAGED TO PRACTICE SOCIAL DISTANCING DUE TO THE COVID-19 PANDEMIC.

## 2020-02-14 NOTE — Patient Instructions (Signed)
Hand Pain Many things can cause hand pain. Some common causes are:  An injury.  Repeating the same movement with your hand over and over (overuse).  Osteoporosis.  Arthritis.  Lumps in the tendons or joints of the hand and wrist (ganglion cysts).  Nerve compression syndromes (carpal tunnel syndrome).  Inflammation of the tendons (tendinitis).  Infection. Follow these instructions at home: Pay attention to any changes in your symptoms. Take these actions to help with your discomfort: Managing pain, stiffness, and swelling   Take over-the-counter and prescription medicines only as told by your health care provider.  Wear a hand splint or support as told by your health care provider.  If directed, put ice on the affected area: ? Put ice in a plastic bag. ? Place a towel between your skin and the bag. ? Leave the ice on for 20 minutes, 2-3 times a day. Activity  Take breaks from repetitive activity often.  Avoid activities that make your pain worse.  Minimize stress on your hands and wrists as much as possible.  Do stretches or exercises as told by your health care provider.  Do not do activities that make your pain worse. Contact a health care provider if:  Your pain does not get better after a few days of self-care.  Your pain gets worse.  Your pain affects your ability to do your daily activities. Get help right away if:  Your hand becomes warm, red, or swollen.  Your hand is numb or tingling.  Your hand is extremely swollen or deformed.  Your hand or fingers turn white or blue.  You cannot move your hand, wrist, or fingers. Summary  Many things can cause hand pain.  Contact your health care provider if your pain does not get better after a few days of self care.  Minimize stress on your hands and wrists as much as possible.  Do not do activities that make your pain worse. This information is not intended to replace advice given to you by your  health care provider. Make sure you discuss any questions you have with your health care provider. Document Revised: 01/07/2018 Document Reviewed: 01/07/2018 Elsevier Patient Education  2020 [employer] Inc[employer]  

## 2020-02-15 ENCOUNTER — Ambulatory Visit: Payer: 59

## 2020-02-15 ENCOUNTER — Other Ambulatory Visit: Payer: 59

## 2020-02-15 LAB — BMP8+EGFR
BUN/Creatinine Ratio: 15 (ref 10–24)
BUN: 22 mg/dL (ref 8–27)
CO2: 25 mmol/L (ref 20–29)
Calcium: 9.3 mg/dL (ref 8.6–10.2)
Chloride: 104 mmol/L (ref 96–106)
Creatinine, Ser: 1.49 mg/dL — ABNORMAL HIGH (ref 0.76–1.27)
GFR calc Af Amer: 57 mL/min/{1.73_m2} — ABNORMAL LOW (ref >59)
GFR calc non Af Amer: 49 mL/min/{1.73_m2} — ABNORMAL LOW (ref >59)
Glucose: 105 mg/dL — ABNORMAL HIGH (ref 65–99)
Potassium: 4.8 mmol/L (ref 3.5–5.2)
Sodium: 140 mmol/L (ref 134–144)

## 2020-02-15 LAB — URIC ACID: Uric Acid: 5.6 mg/dL (ref 3.8–8.4)

## 2020-02-15 LAB — INSULIN, RANDOM: INSULIN: 32.2 u[IU]/mL — ABNORMAL HIGH (ref 2.6–24.9)

## 2020-02-16 ENCOUNTER — Telehealth: Payer: Self-pay

## 2020-02-16 NOTE — Telephone Encounter (Signed)
I left the pt a message that Dr. Sanders wanted to check and see how the pt is doing. ?

## 2020-02-26 ENCOUNTER — Telehealth: Payer: Self-pay

## 2020-02-26 NOTE — Telephone Encounter (Signed)
Please refer him to hand specialist. Dr. Merlyn Lot or Dr. Amanda Pea for hand pain.   RS

## 2020-02-26 NOTE — Telephone Encounter (Signed)
Mrs. Thomas Rowland states that his bump on his thumb is still bothering him. said that he feels like something is inside of it. wants to have surgery if possible. no hx of thyroid cancer would like to start on rybelsus

## 2020-02-28 ENCOUNTER — Other Ambulatory Visit: Payer: Self-pay

## 2020-02-28 DIAGNOSIS — M79641 Pain in right hand: Secondary | ICD-10-CM

## 2020-03-04 ENCOUNTER — Other Ambulatory Visit: Payer: Self-pay | Admitting: Internal Medicine

## 2020-03-13 ENCOUNTER — Ambulatory Visit: Payer: 59 | Admitting: Orthopaedic Surgery

## 2020-03-14 ENCOUNTER — Ambulatory Visit: Payer: 59 | Admitting: Internal Medicine

## 2020-03-20 ENCOUNTER — Ambulatory Visit (INDEPENDENT_AMBULATORY_CARE_PROVIDER_SITE_OTHER): Payer: 59 | Admitting: Orthopaedic Surgery

## 2020-03-20 ENCOUNTER — Encounter: Payer: Self-pay | Admitting: Orthopaedic Surgery

## 2020-03-20 DIAGNOSIS — M79641 Pain in right hand: Secondary | ICD-10-CM

## 2020-03-20 MED ORDER — METHYLPREDNISOLONE 4 MG PO TABS: ORAL_TABLET | ORAL | 0 refills | Status: DC

## 2020-03-20 NOTE — Progress Notes (Signed)
Office Visit Note   Patient: Thomas Rowland           Date of Birth: [birth year]           MRN: [id number] Visit Date: 03/20/2020              Requested by: Dorothyann Peng, MD [address] [place],  [place] [address] PCP: Dorothyann Peng, MD   Assessment & Plan: Visit Diagnoses:  1. Pain in right hand     Plan: This was likely an inflammatory process that was affecting his right hand.  Monitor least try a 6-day steroid taper since his glucose is running good and recommended Voltaren gel to try for his hand.  I gave him a note for work to keep him from doing any type of strenuous activities with his hands for the next 3 months.  This includes heavy gripping and opening of boxes the way he was doing this before in a repetitive manner.  This may end up needing to be a permanent restriction.  I can always reevaluate him in 3 months.  All questions and concerns were answered and addressed.  Follow-Up Instructions: Return in about 3 months (around 06/20/2020).   Orders:  No orders of the defined types were placed in this encounter.  Meds ordered this encounter  Medications  . methylPREDNISolone (MEDROL) 4 MG tablet    Sig: Medrol dose pack. Take as instructed    Dispense:  21 tablet    Refill:  0      Procedures: No procedures performed   Clinical Data: No additional findings.   Subjective: No chief complaint on file. The patient comes in today to the office with a chief complaint of right hand pain.  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  He is 64 years old.  He does have a history of diabetes and gout but he reports good control of his diabetes.  He was actually seen at urgent care in early October for hand pain and swelling and redness and they felt it was potentially an infection and put him on antibiotics.  He said the redness is gone and the swelling is gone.  I suspect this may have been gout instead  of infection.  He finished his course of antibiotics.  He points to both his thumbs a source of his pain.  He describes some triggering as well. HPI  Review of Systems He currently denies any headache, chest pain, shortness of breath, fever, chills, nausea, vomiting  Objective: Vital Signs: There were no vitals taken for this visit.  Physical Exam He is alert and oriented x3 and in no acute distress Ortho Exam Examination of his right hand shows some pain over the MCP joint of the thumb but there is no swelling or redness throughout the hand.  There is no triggering today and no pain over the A1 pulley.  There is no basilar thumb joint pain.  His sensation is intact. Specialty Comments:  No specialty comments available.  Imaging: No results found. X-rays of the right hand from October of this year showed no acute findings.  PMFS History: Patient Active Problem List   Diagnosis Date Noted  . Stable angina (HCC) 12/13/2019  . Hypertension 06/26/2013  . OSA (obstructive sleep apnea) 06/26/2013  . Nonspecific abnormal electrocardiogram (ECG) (EKG) 06/26/2013   Past Medical History:  Diagnosis Date  . Coronary artery disease   .  Diabetes mellitus without complication (HCC)   . History of COVID-19   . Hyperlipidemia   . Hypertension   . Nonspecific abnormal electrocardiogram (ECG) (EKG) 06/26/2013    Family History  Problem Relation Age of Onset  . Hypertension Sister     Past Surgical History:  Procedure Laterality Date  . COLONOSCOPY  2014   Normal  . CORONARY ANGIOPLASTY WITH STENT PLACEMENT    . LEG SURGERY Left 1997   [place]   Social History   Occupational History    Employer: [employer]  Tobacco Use  . Smoking status: Former Smoker    Packs/day: 0.25    Years: 30.00    Pack years: 7.50    Types: Cigarettes    Quit date: 06/26/2001    Years since quitting: 18.7  . Smokeless tobacco: Never Used  Vaping Use  . Vaping Use: Never used  Substance and Sexual Activity    . Alcohol use: Yes  . Drug use: No  . Sexual activity: Not on file

## 2020-03-25 ENCOUNTER — Telehealth: Payer: Self-pay | Admitting: Orthopaedic Surgery

## 2020-03-25 NOTE — Telephone Encounter (Signed)
Aware this is OTC

## 2020-03-25 NOTE — Telephone Encounter (Signed)
Pt states Dr.Blackman wrote a rx for a gel cream that he is supposed to apply to his hand everyday but he lost the rx and doesn't know the name of the cream. Pt states he would like a CB when we've figured out the medicine and have called into his pharmacy   [phone]

## 2020-03-29 ENCOUNTER — Telehealth: Payer: Self-pay

## 2020-03-29 NOTE — Telephone Encounter (Signed)
Notified patient about referral being placed.

## 2020-04-10 ENCOUNTER — Ambulatory Visit: Payer: 59 | Admitting: Internal Medicine

## 2020-04-22 ENCOUNTER — Telehealth: Payer: Self-pay

## 2020-04-22 NOTE — Telephone Encounter (Signed)
Mrs O the pt's niece said that the pt missed his appt because she thought he knew about and it and he didn't.  She also wanted Dr Allyne Gee to know that the pt did make it to his appt with the hand specialist

## 2020-05-09 ENCOUNTER — Encounter: Payer: Self-pay | Admitting: Nurse Practitioner

## 2020-05-09 ENCOUNTER — Ambulatory Visit: Payer: Self-pay | Admitting: Internal Medicine

## 2020-05-09 ENCOUNTER — Ambulatory Visit (INDEPENDENT_AMBULATORY_CARE_PROVIDER_SITE_OTHER): Payer: 59 | Admitting: Nurse Practitioner

## 2020-05-09 ENCOUNTER — Other Ambulatory Visit: Payer: Self-pay

## 2020-05-09 VITALS — BP 130/68 | HR 60 | Temp 98.1°F | Ht 65.4 in | Wt 194.0 lb

## 2020-05-09 DIAGNOSIS — E119 Type 2 diabetes mellitus without complications: Secondary | ICD-10-CM

## 2020-05-09 DIAGNOSIS — Z6831 Body mass index (BMI) 31.0-31.9, adult: Secondary | ICD-10-CM

## 2020-05-09 DIAGNOSIS — I10 Essential (primary) hypertension: Secondary | ICD-10-CM

## 2020-05-09 DIAGNOSIS — E6609 Other obesity due to excess calories: Secondary | ICD-10-CM

## 2020-05-09 DIAGNOSIS — E782 Mixed hyperlipidemia: Secondary | ICD-10-CM | POA: Diagnosis not present

## 2020-05-09 DIAGNOSIS — I2510 Atherosclerotic heart disease of native coronary artery without angina pectoris: Secondary | ICD-10-CM | POA: Diagnosis not present

## 2020-05-09 LAB — POCT UA - MICROALBUMIN
Albumin/Creatinine Ratio, Urine, POC: >300
Creatinine, POC: 50 mg/dL
Microalbumin Ur, POC: 150 mg/L

## 2020-05-09 NOTE — Patient Instructions (Signed)

## 2020-05-09 NOTE — Progress Notes (Addendum)
I,Thomas Rowland,acting as a Education administrator for [employer], NP.,have documented all relevant documentation on the behalf of [employer], NP,as directed by  Bary Castilla, NP while in the presence of Bary Castilla, NP.  This visit occurred during the SARS-CoV-2 public health emergency.  Safety protocols were in place, including screening questions prior to the visit, additional usage of staff PPE, and extensive cleaning of exam room while observing appropriate contact time as indicated for disinfecting solutions.  Subjective:     Patient ID: Thomas Rowland , male    DOB: 1955-10-25 , 65 y.o.   MRN: [id number]   Chief Complaint  Patient presents with  . Hypertension    HPI  Pt presents for dm/htn check. He states that he is compliant with medication.  He has no other concerns today. He also sees a cardiologist for his heart issues.  He does not need any refills today.   CBG: he doesn't check at home.  Diet: eat rice but not too much. He eats in moderation. Twice a day.  Exercise: He doesn't do much. He does a little bit of walking.   Hypertension This is a chronic problem. The current episode started more than 1 year ago. The problem has been gradually worsening since onset. The problem is uncontrolled. Pertinent negatives include no anxiety, blurred vision, chest pain, headaches or palpitations. There are no associated agents to hypertension. Risk factors for coronary artery disease include obesity and diabetes mellitus. Past treatments include diuretics. The current treatment provides no improvement. There are no compliance problems.  There is no history of angina.  Diabetes He presents for his follow-up diabetic visit. He has type 2 diabetes mellitus. There are no hypoglycemic associated symptoms. Pertinent negatives for hypoglycemia include no dizziness or headaches. Pertinent negatives for diabetes include no blurred vision, no chest pain, no fatigue, no polydipsia, no  polyphagia and no polyuria. Symptoms are stable.     Past Medical History:  Diagnosis Date  . Coronary artery disease   . Diabetes mellitus without complication ([place])   . History of COVID-19   . Hyperlipidemia   . Hypertension   . Nonspecific abnormal electrocardiogram (ECG) (EKG) 06/26/2013     Family History  Problem Relation Age of Onset  . Hypertension Sister      Current Outpatient Medications:  .  ferrous sulfate 325 (65 FE) MG EC tablet, Take 325 mg by mouth 3 (three) times daily with meals., Disp: , Rfl:  .  sildenafil (REVATIO) 20 MG tablet, take 1 tablet by oral route every day as needed., Disp: , Rfl: 3 .  allopurinol (ZYLOPRIM) 100 MG tablet, Take 1.5 tablets (150 mg total) by mouth daily., Disp: 135 tablet, Rfl: 1 .  amLODipine (NORVASC) 10 MG tablet, TAKE ONE TABLET BY MOUTH EVERY DAY, Disp: 90 tablet, Rfl: 0 .  aspirin 81 MG EC tablet, Take 1 tablet by mouth daily., Disp: , Rfl:  .  atorvastatin (LIPITOR) 40 MG tablet, , Disp: , Rfl: 0 .  FLUZONE HIGH-DOSE 0.5 ML injection, INJECT INTRAMUSCUALRY ONCE (Patient not taking: Reported on 02/14/2020), Disp: , Rfl: 0 .  latanoprost (XALATAN) 0.005 % ophthalmic solution, Place 1 drop into both eyes daily.  (Patient not taking: Reported on 02/14/2020), Disp: , Rfl:  .  losartan (COZAAR) 25 MG tablet, Take 1 tablet (25 mg total) by mouth in the morning., Disp: 90 tablet, Rfl: 3 .  magnesium oxide (MAG-OX) 400 MG tablet, Take 400 mg by mouth in the morning, at noon, and  at bedtime., Disp: , Rfl:  .  metFORMIN (GLUCOPHAGE) 500 MG tablet, Take 500 mg by mouth daily with breakfast., Disp: , Rfl:  .  methylPREDNISolone (MEDROL) 4 MG tablet, Medrol dose pack. Take as instructed, Disp: 21 tablet, Rfl: 0 .  metoprolol succinate (TOPROL-XL) 100 MG 24 hr tablet, TAKE ONE TABLET BY MOUTH EVERY DAY, Disp: 90 tablet, Rfl: 0 .  MITIGARE 0.6 MG CAPS, Take 1 capsule by mouth 3 (three) times daily as needed., Disp: 30 capsule, Rfl: 2 .   multivitamin-iron-minerals-folic acid (CENTRUM) chewable tablet, Chew 1 tablet by mouth daily., Disp: , Rfl:  .  potassium chloride SA (K-DUR,KLOR-CON) 20 MEQ tablet, TAKE ONE TABLET BY MOUTH EVERY DAY, Disp: 90 tablet, Rfl: 0 .  traMADol (ULTRAM) 50 MG tablet, Take 1 tablet (50 mg total) by mouth every 6 (six) hours as needed., Disp: 20 tablet, Rfl: 0   Allergies  Allergen Reactions  . Lisinopril Cough     Review of Systems  Constitutional: Negative.  Negative for fatigue.  Eyes: Negative for blurred vision.  Respiratory: Negative.   Cardiovascular: Negative.  Negative for chest pain and palpitations.  Gastrointestinal: Negative.   Endocrine: Negative for polydipsia, polyphagia and polyuria.  Neurological: Negative.  Negative for dizziness and headaches.     Today's Vitals   05/09/20 1608  BP: 130/68  Pulse: 60  Temp: 98.1 F (36.7 C)  TempSrc: Oral  Weight: 194 lb (88 kg)  Height: 5' 5.4" (1.661 m)   Body mass index is 31.89 kg/m.  Wt Readings from Last 3 Encounters:  05/09/20 194 lb (88 kg)  02/14/20 187 lb (84.8 kg)  12/13/19 186 lb 12.8 oz (84.7 kg)    Objective:  Physical Exam Constitutional:      Appearance: Normal appearance.  Cardiovascular:     Rate and Rhythm: Normal rate and regular rhythm.     Pulses: Normal pulses.     Heart sounds: Normal heart sounds.  Pulmonary:     Effort: Pulmonary effort is normal. No respiratory distress.     Breath sounds: Normal breath sounds. No wheezing.  Neurological:     Mental Status: He is alert.        Assessment And Plan:     1. Essential hypertension -Chronic, stable, will check labs today  -Continue meds  -Follow up with cardiology  - Hemoglobin A1c - CMP14+EGFR -Limit the intake of processed foods and salt intake. You should increase your intake of green vegetables and fruits. Limit the use of alcohol. Limit fast foods and fried foods. Avoid high fatty saturated and trans fat foods. Keep yourself hydrated  with drinking water. Avoid red meats. Eat lean meats instead. Exercise for atleast 30-45 min for atleast 4-5 times a week. Avoid NSAIDs   2. Type 2 diabetes mellitus without complication, without long-term current use of insulin (HCC) -Chronic, stable, will check labs today  - Hemoglobin A1c  3. Mixed hyper,lipidemia Chronic, stable, will check labs today  -Does not report myalgias  -Continue med.  - Lipid Profile  4. Atherosclerosis of native coronary artery of native heart without angina pectoris  -Stable, followed by cardiologist  -Continue to follow up with cardiologist Dr. Terri Skains    5.  Class 1 obesity due to excess calories with serious comorbidity and body mass index (BMI) of 31.0 to 31.9 in adult -Staying healthy and adopting a healthy lifestyle for your overall health is important. You should eat 7 or more servings of fruits and vegetables per day.  You should drink plenty of water to keep yourself hydrated and your kidneys healthy. This includes about 65-80+ fluid ounces of water. Limit your intake of animal fats especially for elevated cholesterol. Avoid highly processed food and limit your salt intake if you have hypertension. Avoid foods high in saturated/Trans fats. Along with a healthy diet it is also very important to maintain time for yourself to maintain a healthy mental health with low stress levels. You should get atleast 150 min of moderate intensity exercise weekly for a healthy heart. Along with eating right and exercising, aim for at least 7-9 hours of sleep daily.  Eat more whole grains which includes barley, wheat berries, oats, brown rice and whole wheat pasta. Use healthy plant oils which include olive, soy, corn, sunflower and peanut. Limit your caffeine and sugary drinks. Limit your intake of fast foods. Limit milk and dairy products to one or two daily servings.    He is encouraged to initially strive for BMI less than 30 to decrease cardiac risk. He is advised to  exercise no less than 150 minutes per week.     Patient was given opportunity to ask questions. Patient verbalized understanding of the plan and was able to repeat key elements of the plan. All questions were answered to their satisfaction.  Bary Castilla, NP   I, Bary Castilla, NP, have reviewed all documentation for this visit. The documentation on 05/15/20 for the exam, diagnosis, procedures, and orders are all accurate and complete.  THE PATIENT IS ENCOURAGED TO PRACTICE SOCIAL DISTANCING DUE TO THE COVID-19 PANDEMIC.

## 2020-05-10 LAB — CMP14+EGFR
ALT: 22 IU/L (ref 0–44)
AST: 22 IU/L (ref 0–40)
Albumin/Globulin Ratio: 1.5 (ref 1.2–2.2)
Albumin: 4.1 g/dL (ref 3.8–4.8)
Alkaline Phosphatase: 68 IU/L (ref 44–121)
BUN/Creatinine Ratio: 15 (ref 10–24)
BUN: 18 mg/dL (ref 8–27)
Bilirubin Total: 0.3 mg/dL (ref 0.0–1.2)
CO2: 24 mmol/L (ref 20–29)
Calcium: 8.9 mg/dL (ref 8.6–10.2)
Chloride: 104 mmol/L (ref 96–106)
Creatinine, Ser: 1.2 mg/dL (ref 0.76–1.27)
GFR calc Af Amer: 73 mL/min/{1.73_m2} (ref >59)
GFR calc non Af Amer: 63 mL/min/{1.73_m2} (ref >59)
Globulin, Total: 2.8 g/dL (ref 1.5–4.5)
Glucose: 99 mg/dL (ref 65–99)
Potassium: 4.7 mmol/L (ref 3.5–5.2)
Sodium: 142 mmol/L (ref 134–144)
Total Protein: 6.9 g/dL (ref 6.0–8.5)

## 2020-05-10 LAB — LIPID PANEL
Chol/HDL Ratio: 2.6 ratio (ref 0.0–5.0)
Cholesterol, Total: 180 mg/dL (ref 100–199)
HDL: 69 mg/dL (ref >39)
LDL Chol Calc (NIH): 98 mg/dL (ref 0–99)
Triglycerides: 67 mg/dL (ref 0–149)
VLDL Cholesterol Cal: 13 mg/dL (ref 5–40)

## 2020-05-10 LAB — HEMOGLOBIN A1C
Est. average glucose Bld gHb Est-mCnc: 120 mg/dL
Hgb A1c MFr Bld: 5.8 % — ABNORMAL HIGH (ref 4.8–5.6)

## 2020-05-14 ENCOUNTER — Telehealth: Payer: Self-pay

## 2020-05-14 NOTE — Telephone Encounter (Signed)
Left the patient a message to call back for lab results or that he can view them on his mychart account.

## 2020-05-14 NOTE — Telephone Encounter (Signed)
-----   Message from Charlesetta Ivory, NP sent at 05/12/2020  3:08 PM EST ----- Your Hgb A1c is elevated at 5.8. The goal is to be below 5.6. Are you taking your meformin daily? Along with medication, focus on a healthy lifestyle which includes making changes to your diet and exercising more. Eat less carbs, decrease your intake of sugar in foods and drinks. Eat more lean meat. Avoid red meats. Engage in exercise for atleast 30-45 min. Daily. Your kidney functions are also sluggish. Stay well hydrated with water and avoid taking NSAIDs.

## 2020-05-20 ENCOUNTER — Other Ambulatory Visit: Payer: Self-pay | Admitting: Internal Medicine

## 2020-05-20 DIAGNOSIS — I25119 Atherosclerotic heart disease of native coronary artery with unspecified angina pectoris: Secondary | ICD-10-CM

## 2020-05-20 DIAGNOSIS — I10 Essential (primary) hypertension: Secondary | ICD-10-CM

## 2020-05-21 ENCOUNTER — Other Ambulatory Visit: Payer: Self-pay

## 2020-05-21 MED ORDER — POTASSIUM CHLORIDE CRYS ER 20 MEQ PO TBCR: 20.0000 meq | EXTENDED_RELEASE_TABLET | Freq: Every day | ORAL | 0 refills | Status: DC

## 2020-05-30 ENCOUNTER — Ambulatory Visit: Payer: 59 | Admitting: Cardiology

## 2020-05-30 ENCOUNTER — Encounter: Payer: Self-pay | Admitting: Cardiology

## 2020-05-30 ENCOUNTER — Other Ambulatory Visit: Payer: Self-pay

## 2020-05-30 VITALS — BP 159/81 | HR 50 | Temp 98.4°F | Resp 17 | Ht 64.0 in | Wt 190.4 lb

## 2020-05-30 DIAGNOSIS — Z8639 Personal history of other endocrine, nutritional and metabolic disease: Secondary | ICD-10-CM

## 2020-05-30 DIAGNOSIS — Z87891 Personal history of nicotine dependence: Secondary | ICD-10-CM

## 2020-05-30 DIAGNOSIS — I10 Essential (primary) hypertension: Secondary | ICD-10-CM

## 2020-05-30 DIAGNOSIS — E782 Mixed hyperlipidemia: Secondary | ICD-10-CM

## 2020-05-30 DIAGNOSIS — E119 Type 2 diabetes mellitus without complications: Secondary | ICD-10-CM

## 2020-05-30 DIAGNOSIS — Z955 Presence of coronary angioplasty implant and graft: Secondary | ICD-10-CM

## 2020-05-30 DIAGNOSIS — I2510 Atherosclerotic heart disease of native coronary artery without angina pectoris: Secondary | ICD-10-CM

## 2020-05-30 DIAGNOSIS — Z8616 Personal history of COVID-19: Secondary | ICD-10-CM

## 2020-05-30 MED ORDER — METOPROLOL SUCCINATE ER 50 MG PO TB24: 50.0000 mg | ORAL_TABLET | Freq: Every morning | ORAL | 0 refills | Status: DC

## 2020-05-30 NOTE — Progress Notes (Signed)
Thomas Rowland, Thomas Rowland 1956-01-20, MRN [id number]  PCP:  Dorothyann Peng, MD  Cardiologist:  Tessa Lerner, DO, [facility] (established care 10/13/2019) Former Cardiology Providers: Dr. Yates Decamp and Dr. Jordan Hawks.   Date: 05/30/20 Last Office Visit: 11/30/2019  Chief Complaint  Patient presents with  . Coronary Artery Disease    6 MONTH    HPI  Thomas Rowland is a 65 y.o. 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  Patient was originally under the care of Dr. Yates Decamp and due to insurance issues had transitioned his care to [facility] and was under the care of Dr. Kennon Rounds.  While he was at [facility] he did have an abnormal nuclear stress test and subsequently underwent elective angiography and was found to have obstructive CAD.  Based on the records obtained  he has had 2 stents in the RCA and one in the LCx.    Patient reestablished care with [facility] cardiovascular in June 2021 for the management of coronary artery disease and hypertension per PCP.  At that time patient remained asymptomatic from anginal chest pain or equivalents.  He now presents for 27-month follow-up.  He denies any chest pain or anginal equivalent.  He is doing well from a cardiovascular standpoint.  Patient's blood pressure is not well controlled at today's office visit.  However, he states at home his systolic blood pressures usually range around 130 mmHg.  I do not have a blood pressure log to review.  Medications reconciled.  EKG shows bradycardia and per vital signs he has had a bradycardia during  several office visits.  He is currently on metoprolol succinate 100 mg p.o. daily.  He denies any symptoms of bradycardia.    Independently reviewed labs from May 09, 2020.    FUNCTIONAL STATUS: No structured exercise program or daily routine. 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    ALLERGIES: Allergies  Allergen Reactions  . Lisinopril Cough    MEDICATION LIST PRIOR TO VISIT: Current Meds  Medication Sig  . allopurinol (ZYLOPRIM) 100 MG tablet Take 1.5 tablets (150 mg total) by mouth daily.  Marland Kitchen amLODipine (NORVASC) 10 MG tablet TAKE ONE TABLET BY MOUTH EVERY DAY  . aspirin 81 MG EC tablet Take 1 tablet by mouth daily.  Marland Kitchen atorvastatin (LIPITOR) 40 MG tablet   . Cholecalciferol (VITAMIN D3) 25 MCG (1000 UT) CAPS Take 1,000 Units by mouth daily.  . clopidogrel (PLAVIX) 75 MG tablet Take 75 mg by mouth daily.  . ferrous sulfate 325 (65 FE) MG EC tablet Take 325 mg by mouth 3 (three) times daily with meals.  Marland Kitchen FLUZONE HIGH-DOSE 0.5 ML injection INJECT INTRAMUSCUALRY ONCE  . losartan (COZAAR) 25 MG tablet Take 1 tablet (25 mg total) by mouth in the morning.  . magnesium oxide (MAG-OX) 400 MG tablet Take 400 mg by mouth in the morning, at noon, and at bedtime.  . metFORMIN (GLUCOPHAGE) 500 MG tablet Take 500 mg by mouth daily with breakfast.  . metoprolol succinate (TOPROL-XL) 50 MG 24 hr tablet Take 1 tablet (50 mg total) by mouth in the morning. Take with or immediately following a meal. Hold if top blood pressure number  less than 100 mmHg or heart rate less than 60 bpm.  . MITIGARE 0.6 MG CAPS Take 1 capsule by mouth 3 (three) times daily as needed.  . multivitamin-iron-minerals-folic acid (CENTRUM) chewable tablet Chew 1 tablet by mouth daily.  . potassium chloride SA (KLOR-CON) 20 MEQ tablet Take 1 tablet (20 mEq total) by mouth daily.  . traMADol (ULTRAM) 50 MG tablet Take 1 tablet (50 mg total) by mouth every 6 (six) hours as needed.  . [DISCONTINUED] metoprolol succinate  (TOPROL-XL) 100 MG 24 hr tablet TAKE ONE TABLET BY MOUTH EVERY DAY     PAST MEDICAL HISTORY: Past Medical History:  Diagnosis Date  . Coronary artery disease   . Diabetes mellitus without complication (HCC)   . History of COVID-19   . Hyperlipidemia   . Hypertension   . Nonspecific abnormal electrocardiogram (ECG) (EKG) 06/26/2013    PAST SURGICAL HISTORY: Past Surgical History:  Procedure Laterality Date  . COLONOSCOPY  2014   Normal  . CORONARY ANGIOPLASTY WITH STENT PLACEMENT    . LEG SURGERY Left 1997   [place]    FAMILY HISTORY: The patient family history is not on file.  SOCIAL HISTORY:  The patient  reports that he quit smoking about 18 years ago. His smoking use included cigarettes. He has a 7.50 pack-year smoking history. He has never used smokeless tobacco. He reports current alcohol use. He reports that he does not use drugs.  REVIEW OF SYSTEMS: Review of Systems  Constitutional: Negative for chills and fever.  HENT: Negative for hoarse voice and nosebleeds.   Eyes: Negative for discharge, double vision and pain.  Cardiovascular: Negative for chest pain, claudication, dyspnea on exertion, leg swelling, near-syncope, orthopnea, palpitations, paroxysmal nocturnal dyspnea and syncope.  Respiratory: Negative for hemoptysis and shortness of breath.   Musculoskeletal: Negative for muscle cramps and myalgias.  Gastrointestinal: Negative for abdominal pain, constipation, diarrhea, hematemesis, hematochezia, melena, nausea and vomiting.  Neurological: Negative for dizziness and light-headedness.    PHYSICAL EXAM: Vitals with BMI 05/30/2020 05/09/2020 02/14/2020  Height 5\' 4"  5' 5.4" 5' 4.4"  Weight 190 lbs 6 oz 194 lbs 187 lbs  BMI 32.67 31.9 31.69  Systolic 159 130 223  Diastolic 81 68 70  Pulse 50 60 56   CONSTITUTIONAL: Well-developed and well-nourished. No acute distress.  SKIN: Skin is warm and dry. No rash noted. No cyanosis. No pallor. No jaundice HEAD:  Normocephalic and atraumatic.  EYES: No scleral icterus MOUTH/THROAT: Moist oral membranes.  NECK: No JVD present. No thyromegaly noted. No carotid bruits  LYMPHATIC: No visible cervical adenopathy.  CHEST Normal respiratory effort. No intercostal retractions  LUNGS: Clear to auscultation bilaterally.  No stridor. No wheezes. No rales.  CARDIOVASCULAR: Regular, bradycardia, positive S1-S2, no murmurs rubs or gallops appreciated.  ABDOMINAL: Soft, nontender, nondistended, positive bowel sounds all 4 quadrants. No apparent ascites.  EXTREMITIES: No peripheral edema  HEMATOLOGIC: No significant bruising NEUROLOGIC: Oriented to person, place, and time. Nonfocal. Normal muscle tone.  PSYCHIATRIC: Normal mood and affect. Normal behavior. Cooperative  CARDIAC DATABASE: EKG: 10/13/2019: Normal sinus rhythm, 86 bpm, old inferior infarct, ST-T changes in the anterolateral.  Echocardiogram: 10/20/2019: LVEF 50-55%, moderate LVH, indeterminate diastolic filling pattern, mildly dilated left atrium, mild AR, mild MR, mild TR, mild PR.  Stress Testing: 06/15/2019 from care everywhere (per report):  1. Potential small area of pharmacologically induced ischemia involving the mid aspect of the inferior wall of the left ventricle.  2. No scintigraphic evidence of prior infarction.  3. Mild hypokinesia involving the inferior wall of the left ventricle.  4. Ejection fraction - 50%.  Heart Catheterization: 07/15/2018 LHC with Dr. Theron Arista at [facility] (care everywhere): "Angiography revealed mild LAD, 80% mLCx, 95% mRCA. Stented RCA with 3.5x18 and 3.5x12 Onyx DES with good angiographic result. Stented LCX with a 3.5x15 Onyx DES with good angiographic result."  LABORATORY DATA: CBC Latest Ref Rng & Units 08/08/2010  WBC 4.0 - 10.5 K/uL 10.5  Hemoglobin 13.0 - 17.0 g/dL 16.9  Hematocrit 67.8 - 52.0 % 38.7(L)  Platelets 150 - 400 K/uL 163    CMP Latest Ref Rng & Units 05/09/2020 02/14/2020 11/23/2019  Glucose 65 -  99 mg/dL 99 938(B) 78  BUN 8 - 27 mg/dL 18 22 18   Creatinine 0.76 - 1.27 mg/dL 0.17) 5.10(C)  Sodium 134 - 144 mmol/L 142 140 142  Potassium 3.5 - 5.2 mmol/L 4.7 4.8 4.4  Chloride 96 - 106 mmol/L 104 104 104  CO2 20 - 29 mmol/L 24 25 28   Calcium 8.6 - 10.2 mg/dL 8.9 9.3 9.3  Total Protein 6.0 - 8.5 g/dL 6.9 - -  Total Bilirubin 0.0 - 1.2 mg/dL 0.3 - -  Alkaline Phos 44 - 121 IU/L 68 - -  AST 0 - 40 IU/L 22 - -  ALT 0 - 44 IU/L 22 - -    Lipid Panel     Component Value Date/Time   CHOL 180 05/09/2020 1648   TRIG 67 05/09/2020 1648   HDL 69 05/09/2020 1648   CHOLHDL 2.6 05/09/2020 1648   LDLCALC 98 05/09/2020 1648   LABVLDL 13 05/09/2020 1648    No components found for: NTPROBNP No results for input(s): PROBNP in the last 8760 hours. Recent Labs    12/13/19 1615  TSH 1.590    HEMOGLOBIN A1C Lab Results  Component Value Date   HGBA1C 5.8 (H) 05/09/2020    IMPRESSION:    ICD-10-CM   1. Atherosclerosis of native coronary artery of native heart without angina pectoris  I25.10 EKG 12-Lead    metoprolol succinate (TOPROL-XL) 50 MG 24 hr tablet  2. History of coronary angioplasty with insertion of stent  Z95.5   3. Essential hypertension  I10   4. Mixed hyperlipidemia  E78.2   5. Former smoker  Z87.891   6. Non-insulin dependent type 2 diabetes mellitus (HCC)  E11.9   7. Hx of hyperaldosteronism  Z86.39   8. History of COVID-19  Z86.16      RECOMMENDATIONS: Thomas Rowland is a 65 y.o. 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  Atherosclerosis of the native coronary arteries with prior PCI's without angina pectoris:  Medications reconciled.  No angina or anginal equivalent since last office visit.  EKG shows  sinus bradycardia with nonspecific T wave abnormalities.  No significant change compared to prior ECG.  Recommend discontinuation of Toprol-XL 100 mg p.o. daily and transition to Toprol 50 mg p.o. daily.  Office blood pressures not well under control.  Patient states that his home blood pressures are within acceptable range.  He is asked to keep a log of his blood pressures and to bring it in at the next office visit.  Independently reviewed the most recent blood work from January 2022.  Patient is LDL is within acceptable range but not at goal.  Would recommend  his LDL to be less than 70 mg/dL given his underlying CAD and NIDDM.  Patient states that he will work on lifestyle modifications at this time prior to uptitrating statin therapy.  Bradycardia:  Patient remains asymptomatic but vital signs during multiple office visits consistent with bradycardia.  We will decrease Toprol-XL to 50 mg p.o. daily.  We will continue to monitor.  History of coronary artery stents: Stable  Benign essential hypertension:  Medications reconciled.  Office blood pressure is currently at goal.  Educated on importance of a low-salt diet.  Given his underlying chronic kidney disease recommended to avoid nephrotoxic agents such as NSAIDs.  Patient is asked to keep a log of his blood pressures and to bring it in at the next office visit.  Mixed hyperlipidemia: Continue statin therapy.  He does not endorse myalgias.  Most recent lipid profile reviewed.  Former smoker: Educated on the importance of continued smoking cessation.  Non-insulin-dependent diabetes: Currently managed by primary care provider.  Patient educated on importance of glycemic control given his underlying CAD.  I will see him back in 1 month to reevaluate his ventricular rate, EKG on arrival, and reevaluate his antihypertensive medications based on his blood pressure log.  FINAL MEDICATION LIST END OF ENCOUNTER: Meds ordered this  encounter  Medications  . metoprolol succinate (TOPROL-XL) 50 MG 24 hr tablet    Sig: Take 1 tablet (50 mg total) by mouth in the morning. Take with or immediately following a meal. Hold if top blood pressure number less than 100 mmHg or heart rate less than 60 bpm.    Dispense:  90 tablet    Refill:  0     Current Outpatient Medications:  .  allopurinol (ZYLOPRIM) 100 MG tablet, Take 1.5 tablets (150 mg total) by mouth daily., Disp: 135 tablet, Rfl: 1 .  amLODipine (NORVASC) 10 MG tablet, TAKE ONE TABLET BY MOUTH EVERY DAY, Disp: 90 tablet, Rfl: 0 .  aspirin 81 MG EC tablet, Take 1 tablet by mouth daily., Disp: , Rfl:  .  atorvastatin (LIPITOR) 40 MG tablet, , Disp: , Rfl: 0 .  Cholecalciferol (VITAMIN D3) 25 MCG (1000 UT) CAPS, Take 1,000 Units by mouth daily., Disp: , Rfl:  .  clopidogrel (PLAVIX) 75 MG tablet, Take 75 mg by mouth daily., Disp: , Rfl:  .  ferrous sulfate 325 (65 FE) MG EC tablet, Take 325 mg by mouth 3 (three) times daily with meals., Disp: , Rfl:  .  FLUZONE HIGH-DOSE 0.5 ML injection, INJECT INTRAMUSCUALRY ONCE, Disp: , Rfl: 0 .  losartan (COZAAR) 25 MG tablet, Take 1 tablet (25 mg total) by mouth in the morning., Disp: 90 tablet, Rfl: 3 .  magnesium oxide (MAG-OX) 400 MG tablet, Take 400 mg by mouth in the morning, at noon, and at bedtime., Disp: , Rfl:  .  metFORMIN (GLUCOPHAGE) 500 MG tablet, Take 500 mg by mouth daily with breakfast., Disp: , Rfl:  .  metoprolol succinate (TOPROL-XL) 50 MG 24 hr tablet, Take 1 tablet (50 mg total) by mouth in the morning. Take with or immediately following a meal. Hold if top blood pressure number less than 100 mmHg or heart rate less than 60 bpm., Disp: 90 tablet, Rfl: 0 .  MITIGARE 0.6 MG CAPS, Take 1 capsule by mouth 3 (three) times daily as needed., Disp: 30 capsule, Rfl: 2 .  multivitamin-iron-minerals-folic acid (CENTRUM) chewable tablet, Chew 1 tablet by mouth daily., Disp: , Rfl:  .  potassium chloride SA (KLOR-CON)  20 MEQ  tablet, Take 1 tablet (20 mEq total) by mouth daily., Disp: 90 tablet, Rfl: 0 .  traMADol (ULTRAM) 50 MG tablet, Take 1 tablet (50 mg total) by mouth every 6 (six) hours as needed., Disp: 20 tablet, Rfl: 0  Orders Placed This Encounter  Procedures  . EKG 12-Lead   There are no Patient Instructions on file for this visit.   --Continue cardiac medications as reconciled in final medication list. --Return for Follow up Bradycardia and EKG on arrival . Or sooner if needed. --Continue follow-up with your primary care physician regarding the management of your other chronic comorbid conditions.  Patient's questions and concerns were addressed to his satisfaction. He voices understanding of the instructions provided during this encounter.   This note was created using a voice recognition software as a result there may be grammatical errors inadvertently enclosed that do not reflect the nature of this encounter. Every attempt is made to correct such errors.  Tessa LernerSunit Ramesses Crampton, [place]DO, [facility]ACC  Pager: [phone] Office: [phone]

## 2020-06-03 ENCOUNTER — Other Ambulatory Visit: Payer: Self-pay | Admitting: Internal Medicine

## 2020-06-20 ENCOUNTER — Encounter: Payer: Self-pay | Admitting: Orthopaedic Surgery

## 2020-06-20 ENCOUNTER — Ambulatory Visit (INDEPENDENT_AMBULATORY_CARE_PROVIDER_SITE_OTHER): Payer: 59 | Admitting: Orthopaedic Surgery

## 2020-06-20 DIAGNOSIS — M25521 Pain in right elbow: Secondary | ICD-10-CM | POA: Diagnosis not present

## 2020-06-20 MED ORDER — TRAMADOL HCL 50 MG PO TABS: 100.0000 mg | ORAL_TABLET | Freq: Four times a day (QID) | ORAL | 0 refills | Status: DC | PRN

## 2020-06-20 NOTE — Progress Notes (Signed)
The patient is someone I saw about 3 months ago with right hand pain.  He is 65 years old and does a lot of repetitive work with his hand.  I put him on a steroid taper and tried Voltaren gel over his hand.  He does take Plavix so he cannot take anti-inflammatories.  He says his pain in the hand is much better overall.  He has occasional numbness and tingling in his fingers.  He has no pain on my exam today of his right hand.  He does have weak pinch and grip strength.  There is no muscle atrophy that I can see.  He is also complaining of some right leg pain that is from his knee down his foot and ankle.  His exam of these areas are normal.  This pain started only today.  I recommended Voltaren gel for his leg and his hand as needed.  He can also take some tramadol as needed.  He knows to avoid any anti-inflammatories.  We can always try a 6-day steroid taper again if needed given his hemoglobin A1c is below 6.

## 2020-06-22 ENCOUNTER — Emergency Department (HOSPITAL_COMMUNITY)
Admission: EM | Admit: 2020-06-22 | Discharge: 2020-06-22 | Disposition: A | Discharge status: Home / Self Care | Payer: 59 | Attending: Emergency Medicine | Admitting: Emergency Medicine

## 2020-06-22 ENCOUNTER — Emergency Department (HOSPITAL_BASED_OUTPATIENT_CLINIC_OR_DEPARTMENT_OTHER): Payer: 59

## 2020-06-22 ENCOUNTER — Emergency Department (HOSPITAL_COMMUNITY): Payer: 59

## 2020-06-22 ENCOUNTER — Other Ambulatory Visit: Payer: Self-pay

## 2020-06-22 DIAGNOSIS — M79604 Pain in right leg: Secondary | ICD-10-CM

## 2020-06-22 DIAGNOSIS — I2510 Atherosclerotic heart disease of native coronary artery without angina pectoris: Secondary | ICD-10-CM | POA: Insufficient documentation

## 2020-06-22 DIAGNOSIS — Z7902 Long term (current) use of antithrombotics/antiplatelets: Secondary | ICD-10-CM | POA: Insufficient documentation

## 2020-06-22 DIAGNOSIS — Z79899 Other long term (current) drug therapy: Secondary | ICD-10-CM | POA: Diagnosis not present

## 2020-06-22 DIAGNOSIS — Z7982 Long term (current) use of aspirin: Secondary | ICD-10-CM | POA: Diagnosis not present

## 2020-06-22 DIAGNOSIS — R609 Edema, unspecified: Secondary | ICD-10-CM | POA: Diagnosis not present

## 2020-06-22 DIAGNOSIS — I10 Essential (primary) hypertension: Secondary | ICD-10-CM | POA: Diagnosis not present

## 2020-06-22 DIAGNOSIS — M79661 Pain in right lower leg: Secondary | ICD-10-CM | POA: Diagnosis present

## 2020-06-22 DIAGNOSIS — Z8616 Personal history of COVID-19: Secondary | ICD-10-CM | POA: Insufficient documentation

## 2020-06-22 DIAGNOSIS — E119 Type 2 diabetes mellitus without complications: Secondary | ICD-10-CM | POA: Insufficient documentation

## 2020-06-22 DIAGNOSIS — Z87891 Personal history of nicotine dependence: Secondary | ICD-10-CM | POA: Insufficient documentation

## 2020-06-22 DIAGNOSIS — Z7984 Long term (current) use of oral hypoglycemic drugs: Secondary | ICD-10-CM | POA: Insufficient documentation

## 2020-06-22 MED ORDER — DICLOFENAC SODIUM 1 % EX GEL: 2.0000 g | Freq: Four times a day (QID) | CUTANEOUS | 0 refills | Status: AC

## 2020-06-22 MED ORDER — ACETAMINOPHEN 325 MG PO TABS
650.0000 mg | ORAL_TABLET | Freq: Once | ORAL | Status: AC
  Administered 2020-06-22: 650 mg via ORAL
  Filled 2020-06-22: qty 2

## 2020-06-22 NOTE — ED Notes (Signed)
Pt to vascular.

## 2020-06-22 NOTE — Progress Notes (Signed)
VASCULAR LAB    Right lower extremity venous duplex has been performed.  See CV proc for preliminary results.  Messaged results to Farrel Gordon, PA-C, via secure chat   Sherren Kerns, RVT 06/22/2020, 12:19 PM

## 2020-06-22 NOTE — ED Notes (Addendum)
Pt returned from X-ray.  

## 2020-06-22 NOTE — Discharge Instructions (Addendum)
Your work-up today was reassuring, I want you to use the Diflucan neck gel on your knee, you can also ice this area and use Tylenol as directed on the bottle for pain.  Want to follow-up with your primary care in the next couple of days.  Your blood pressure was also elevated today in the emergency department, please have this rechecked with your PCP as well.  If you have any new worsening concerning symptom please come back to the emergency department.    Tests performed today include: An x-ray of the affected area - does NOT show any broken bones or dislocations.  Vital signs. See below for your results today.   Home care instructions: -- *PRICE in the first 24-48 hours after injury: Protect (with brace, splint, sling), if given by your provider Rest Ice- Do not apply ice pack directly to your skin, place towel or similar between your skin and ice/ice pack. Apply ice for 20 min, then remove for 40 min while awake Compression- Wear brace, elastic bandage, splint as directed by your provider Elevate affected extremity above the level of your heart when not walking around for the first 24-48 hours   Use Ibuprofen (Motrin/Advil) 600mg  every 6 hours as needed for pain (do not exceed max dose in 24 hours, 2400mg )  Follow-up instructions: Please follow-up with your primary care provider or the provided orthopedic physician (bone specialist) if you continue to have significant pain in 1 week. In this case you may have a more severe injury that requires further care.   Return instructions:  Please return if your toes or feet are numb or tingling, appear gray or blue, or you have severe pain (also elevate the leg and loosen splint or wrap if you were given one) Please return to the Emergency Department if you experience worsening symptoms.  Please return if you have any other emergent concerns. Additional Information:  Your vital signs today were: BP 136/85   Pulse (!) 54   Temp 98.8 F (37.1 C)  (Oral)   Resp 13   SpO2 98%  If your blood pressure (BP) was elevated above 135/85 this visit, please have this repeated by your doctor within one month. ---------------

## 2020-06-22 NOTE — ED Notes (Signed)
Pt transported to xray via stretcher in stable condition.

## 2020-06-22 NOTE — ED Triage Notes (Signed)
Pt. Stated, foot pain for 5 days. Hurts from my knee to my foot right

## 2020-06-22 NOTE — ED Notes (Signed)
Pt able to ambulate. Does c/o pain to right knee with ambulation.

## 2020-06-22 NOTE — ED Provider Notes (Signed)
[facility] EMERGENCY DEPARTMENT Provider Note   CSN: [id number] Arrival date & time: 06/22/20  1937     History No chief complaint on file.   Neev Mcmains is a 65 y.o. male with past medical history of coronary disease, diabetes, hyperlipidemia, hypertension, OSA the presents emerge department today for knee to foot pain.  Patient states that around 4 days ago he started having severe knee to foot pain, prevented him from walking, however has slightly lessened up over the past couple of days and has been able to walk.  States that he does have a history of gout, has been taking his gout medications, however states that he is never had gout in his knee before.  Denies any fevers, chills.  Knee or leg was never warm or hot.  Denies any numbness and tingling down into his leg.  States that his lower foot was slightly swollen, denies any injury to this area, no falls.  States that he primarily came here today for x-ray.  Patient takes Plavix, no other blood thinners.  Has not seen anyone for this.  Primarily complaining of knee pain that radiates downwards, no other complaints at this time.  HPI     Past Medical History:  Diagnosis Date  . Coronary artery disease   . Diabetes mellitus without complication (HCC)   . History of COVID-19   . Hyperlipidemia   . Hypertension   . Nonspecific abnormal electrocardiogram (ECG) (EKG) 06/26/2013    Patient Active Problem List   Diagnosis Date Noted  . Stable angina (HCC) 12/13/2019  . Hypertension 06/26/2013  . OSA (obstructive sleep apnea) 06/26/2013  . Nonspecific abnormal electrocardiogram (ECG) (EKG) 06/26/2013    Past Surgical History:  Procedure Laterality Date  . COLONOSCOPY  2014   Normal  . CORONARY ANGIOPLASTY WITH STENT PLACEMENT    . LEG SURGERY Left 1997   [place]       No family history on file.  Social History   Tobacco Use  . Smoking status: Former Smoker    Packs/day: 0.25    Years: 30.00     Pack years: 7.50    Types: Cigarettes    Quit date: 06/26/2001    Years since quitting: 19.0  . Smokeless tobacco: Never Used  Vaping Use  . Vaping Use: Never used  Substance Use Topics  . Alcohol use: Yes  . Drug use: No    Home Medications Prior to Admission medications   Medication Sig Start Date End Date Taking? Authorizing Provider  diclofenac Sodium (VOLTAREN) 1 % GEL Apply 2 g topically 4 (four) times daily for 6 days. 06/22/20 06/28/20 Yes Jezelle Gullick, PA-C  allopurinol (ZYLOPRIM) 100 MG tablet Take 1.5 tablets (150 mg total) by mouth daily. 10/02/19   Arnette Felts, FNP  amLODipine (NORVASC) 10 MG tablet TAKE ONE TABLET BY MOUTH EVERY DAY 06/03/20   Dorothyann Peng, MD  aspirin 81 MG EC tablet Take 1 tablet by mouth daily.    [provider]  atorvastatin (LIPITOR) 40 MG tablet  04/06/17   [provider]  Cholecalciferol (VITAMIN D3) 25 MCG (1000 UT) CAPS Take 1,000 Units by mouth daily.    [provider]  clopidogrel (PLAVIX) 75 MG tablet Take 75 mg by mouth daily.    [provider]  ferrous sulfate 325 (65 FE) MG EC tablet Take 325 mg by mouth 3 (three) times daily with meals.    [provider]  FLUZONE HIGH-DOSE 0.5 ML injection  INJECT [facility] ONCE 03/01/17   [provider]  losartan (COZAAR) 25 MG tablet Take 1 tablet (25 mg total) by mouth in the morning. 10/13/19 02/14/20  Tolia, Sunit, DO  magnesium oxide (MAG-OX) 400 MG tablet Take 400 mg by mouth in the morning, at noon, and at bedtime.    [provider]  metFORMIN (GLUCOPHAGE) 500 MG tablet Take 500 mg by mouth daily with breakfast.    [provider]  metoprolol succinate (TOPROL-XL) 50 MG 24 hr tablet Take 1 tablet (50 mg total) by mouth in the morning. Take with or immediately following a meal. Hold if top blood pressure number less than 100 mmHg or heart rate less than 60 bpm. 05/30/20 08/28/20  Tolia, Sunit, DO  MITIGARE 0.6 MG CAPS Take 1  capsule by mouth 3 (three) times daily as needed. 09/13/19   Arnette Felts, FNP  multivitamin-iron-minerals-folic acid (CENTRUM) chewable tablet Chew 1 tablet by mouth daily.    [provider]  potassium chloride SA (KLOR-CON) 20 MEQ tablet Take 1 tablet (20 mEq total) by mouth daily. 05/21/20   Charlesetta Ivory, NP  traMADol (ULTRAM) 50 MG tablet Take 2 tablets (100 mg total) by mouth every 6 (six) hours as needed. 06/20/20 06/20/21  Kathryne Hitch, MD    Allergies    Lisinopril  Review of Systems   Review of Systems  Constitutional: Negative for diaphoresis, fatigue and fever.  Eyes: Negative for visual disturbance.  Respiratory: Negative for shortness of breath.   Cardiovascular: Negative for chest pain.  Gastrointestinal: Negative for nausea and vomiting.  Musculoskeletal: Positive for arthralgias and gait problem. Negative for back pain and myalgias.  Skin: Negative for color change, pallor, rash and wound.  Neurological: Negative for syncope, weakness, light-headedness, numbness and headaches.  Psychiatric/Behavioral: Negative for behavioral problems and confusion.    Physical Exam Updated Vital Signs BP 136/85   Pulse (!) 54   Temp 98.8 F (37.1 C) (Oral)   Resp 13   SpO2 98%   Physical Exam Constitutional:      General: He is not in acute distress.    Appearance: Normal appearance. He is not ill-appearing, toxic-appearing or diaphoretic.  Cardiovascular:     Rate and Rhythm: Normal rate and regular rhythm.     Pulses: Normal pulses.  Pulmonary:     Effort: Pulmonary effort is normal.     Breath sounds: Normal breath sounds.  Musculoskeletal:        General: Normal range of motion.       Legs:     Comments: Patient with tenderness to right knee down to calf.  Patient with soft compartments, normal leg raise.  Normal laxity test of knee.  Knee does not appear swollen, is tender to touch.  Slight swelling of the foot noted, no edema.  PT pulses are  2+.  Normal strength to knee ankle and foot and hip.  Normal sensation throughout.  No discoloration, warmth or edema noted throughout.  Skin:    General: Skin is warm and dry.     Capillary Refill: Capillary refill takes less than 2 seconds.  Neurological:     General: No focal deficit present.     Mental Status: He is alert and oriented to person, place, and time.  Psychiatric:        Mood and Affect: Mood normal.        Behavior: Behavior normal.        Thought Content: Thought content normal.  ED Results / Procedures / Treatments   Labs (all labs ordered are listed, but only abnormal results are displayed) Labs Reviewed - No data to display  EKG None  Radiology DG Knee 2 Views Right  Result Date: 06/22/2020 CLINICAL DATA:  Right lower extremity pain. EXAM: RIGHT KNEE - 1-2 VIEW COMPARISON:  None. FINDINGS: Two views of the right knee are negative for fracture, dislocation or large joint effusion. Lateral view is slightly limited due to the oblique projection. No focal soft tissue abnormality. IMPRESSION: No acute abnormality to the right knee. Electronically Signed   By: Richarda Overlie M.D.   On: 06/22/2020 10:13   DG Ankle 2 Views Right  Result Date: 06/22/2020 CLINICAL DATA:  Foot pain x5 days normal injury EXAM: RIGHT ANKLE - 2 VIEW COMPARISON:  None. FINDINGS: There is no evidence of fracture, dislocation, or joint effusion. There is no evidence of arthropathy or other focal bone abnormality. Calcaneal enthesophyte. Soft tissues are unremarkable. IMPRESSION: Negative. Electronically Signed   By: Maudry Mayhew MD   On: 06/22/2020 10:14   DG Foot Complete Right  Result Date: 06/22/2020 CLINICAL DATA:  Right foot pain. EXAM: RIGHT FOOT COMPLETE - 3+ VIEW COMPARISON:  Right ankle 06/22/2020 FINDINGS: Negative for fracture or dislocation. Enthesopathic changes at the Achilles tendon insertion site. Alignment of the foot is within normal limits. No focal soft tissue abnormality.  IMPRESSION: No acute abnormality to the right foot. Electronically Signed   By: Richarda Overlie M.D.   On: 06/22/2020 10:15   VAS [place] LOWER EXTREMITY VENOUS (DVT) (ONLY [facility] & [facility] 7a-7p)  Result Date: 06/22/2020  Lower Venous DVT Study Indications: Pain, and Edema.  Comparison Study: No prior study on file Performing Technologist: Sherren Kerns RVS  Examination Guidelines: A complete evaluation includes B-mode imaging, spectral Doppler, color Doppler, and power Doppler as needed of all accessible portions of each vessel. Bilateral testing is considered an integral part of a complete examination. Limited examinations for reoccurring indications may be performed as noted. The reflux portion of the exam is performed with the patient in reverse Trendelenburg.  +---------+---------------+---------+-----------+----------+--------------+ RIGHT    CompressibilityPhasicitySpontaneityPropertiesThrombus Aging +---------+---------------+---------+-----------+----------+--------------+ CFV      Full           Yes      Yes                                 +---------+---------------+---------+-----------+----------+--------------+ SFJ      Full                                                        +---------+---------------+---------+-----------+----------+--------------+ FV Prox  Full                                                        +---------+---------------+---------+-----------+----------+--------------+ FV Mid   Full                                                        +---------+---------------+---------+-----------+----------+--------------+  FV DistalFull                                                        +---------+---------------+---------+-----------+----------+--------------+ PFV      Full                                                        +---------+---------------+---------+-----------+----------+--------------+ POP      Full           Yes      Yes                                  +---------+---------------+---------+-----------+----------+--------------+ PTV      Full                                                        +---------+---------------+---------+-----------+----------+--------------+ PERO     Full                                                        +---------+---------------+---------+-----------+----------+--------------+   +----+---------------+---------+-----------+----------+--------------+ LEFTCompressibilityPhasicitySpontaneityPropertiesThrombus Aging +----+---------------+---------+-----------+----------+--------------+ CFV Full           Yes      Yes                                 +----+---------------+---------+-----------+----------+--------------+     Summary: RIGHT: - There is no evidence of deep vein thrombosis in the lower extremity.  - No cystic structure found in the popliteal fossa.  LEFT: - No evidence of common femoral vein obstruction.  *See table(s) above for measurements and observations.    Preliminary     Procedures Procedures   Medications Ordered in ED Medications  acetaminophen (TYLENOL) tablet 650 mg (650 mg Oral Given 06/22/20 1132)    ED Course  I have reviewed the triage vital signs and the nursing notes.  Pertinent labs & imaging results that were available during my care of the patient were reviewed by me and considered in my medical decision making (see chart for details).    MDM Rules/Calculators/A&P                          Markie Frith is a 65 y.o. male with past medical history of coronary disease, diabetes, hyperlipidemia, hypertension, OSA the presents emerge department today for knee to foot pain.  Plain films negative, DVT study negative.  Compartments are soft, patient is distally neurovascularly intact.  Low suspicion for gout.  No concerns for septic joint, patient is afebrile, nontachycardic, appears well and knee is not red or hot, with normal range  of motion to knee.  Patient to be discharged to follow-up with PCP.  Plan communicated to patient and nephew who is in the room, they are agreeable.  Patient was able to ambulate, did complain of some knee pain with ambulation, will have nursing compress this area to help relieve the pain, medications and symptomatic treatment discussed with patient.  Patient be discharged.  Doubt need for further emergent work up at this time. I explained the diagnosis and have given explicit precautions to return to the ER including for any other new or worsening symptoms. The patient understands and accepts the medical plan as it's been dictated and I have answered their questions. Discharge instructions concerning home care and prescriptions have been given. The patient is STABLE and is discharged to home in good condition.  Final Clinical Impression(s) / ED Diagnoses Final diagnoses:  Pain of right lower extremity    Rx / DC Orders ED Discharge Orders         Ordered    diclofenac Sodium (VOLTAREN) 1 % GEL  4 times daily        06/22/20 1224           Farrel Gordonatel, Blaize Nipper, PA-C 06/22/20 1309    Linwood DibblesKnapp, Jon, MD 06/23/20 1114

## 2020-06-24 ENCOUNTER — Telehealth: Payer: Self-pay | Admitting: Orthopaedic Surgery

## 2020-06-24 ENCOUNTER — Encounter: Payer: Self-pay | Admitting: Internal Medicine

## 2020-06-24 ENCOUNTER — Telehealth: Payer: Self-pay

## 2020-06-24 ENCOUNTER — Other Ambulatory Visit: Payer: Self-pay

## 2020-06-24 NOTE — Telephone Encounter (Signed)
Patient called stating the pharmacy asked if the Voltaren gel can  be written in a Rx.   The gel would be less if it went through insurance. The number to contact patient is [phone]

## 2020-06-24 NOTE — Telephone Encounter (Signed)
Spoke with patient about nature of appointment, pt stated that he was having leg pains, already visiting the urgent on Saturday 06/22/2020. He did emphasize on needed an appointment today on 06/24/2020, pt was told schedule was at capacity on this day, and was told he could be seen on 06/26/2020. Pt denied and did get frustrated, things were then further discussed with daughter of pt, everything conversed was also noted to nurses/ providers to try to figure a best time at earliest convenience for him to be seen, even if it has to be for a virtual. Also told to daughter, which was okay with her and that we would call her back at our earliest convenience for him to be seen either way.

## 2020-06-24 NOTE — Telephone Encounter (Signed)
Patient aware this was called in Friday

## 2020-06-25 ENCOUNTER — Encounter: Payer: Self-pay | Admitting: Nurse Practitioner

## 2020-06-25 ENCOUNTER — Ambulatory Visit (INDEPENDENT_AMBULATORY_CARE_PROVIDER_SITE_OTHER): Payer: 59 | Admitting: Nurse Practitioner

## 2020-06-25 ENCOUNTER — Other Ambulatory Visit: Payer: Self-pay

## 2020-06-25 VITALS — BP 142/78 | HR 64 | Temp 98.3°F | Ht 64.0 in | Wt 190.0 lb

## 2020-06-25 DIAGNOSIS — M79604 Pain in right leg: Secondary | ICD-10-CM | POA: Diagnosis not present

## 2020-06-25 DIAGNOSIS — M10472 Other secondary gout, left ankle and foot: Secondary | ICD-10-CM

## 2020-06-25 MED ORDER — ALLOPURINOL 100 MG PO TABS
150.0000 mg | ORAL_TABLET | Freq: Every day | ORAL | 1 refills | Status: DC
Start: 2020-06-25 — End: 2020-09-24

## 2020-06-25 MED ORDER — KETOROLAC TROMETHAMINE 30 MG/ML IJ SOLN
30.0000 mg | Freq: Once | INTRAMUSCULAR | Status: AC
  Administered 2020-06-25: 30 mg via INTRAMUSCULAR

## 2020-06-25 MED ORDER — TRIAMCINOLONE ACETONIDE 40 MG/ML IJ SUSP
40.0000 mg | Freq: Once | INTRAMUSCULAR | Status: AC
  Administered 2020-06-25: 40 mg via INTRAMUSCULAR

## 2020-06-25 NOTE — Progress Notes (Signed)
I,Yamilka Roman [employer] as a Neurosurgeon for [employer], FNP.,have documented all relevant documentation on the behalf of Arnette Felts, FNP,as directed by  Arnette Felts, FNP while in the presence of Arnette Felts, FNP. This visit occurred during the SARS-CoV-2 public health emergency.  Safety protocols were in place, including screening questions prior to the visit, additional usage of staff PPE, and extensive cleaning of exam room while observing appropriate contact time as indicated for disinfecting solutions.  Subjective:     Patient ID: Thomas Rowland , male    DOB: November 28, 1955 , 66 y.o.   MRN: [id number]   Chief Complaint  Patient presents with  . Leg Pain    HPI  Patient is here for pain in the right knee, on a scale 1-10 pt did say his pain was a 9. His right foot is also swollen, pt states that it is doing much better since visit to the ER on Saturday. He had an xray to his right foot, ankle and knee. Also had an ultrasound done which was negative for a DVT.  He has been taking Tramadol. He had also taken Tylenol for pain prior to taking Tramadol.       Past Medical History:  Diagnosis Date  . Coronary artery disease   . Diabetes mellitus without complication (HCC)   . History of COVID-19   . Hyperlipidemia   . Hypertension   . Nonspecific abnormal electrocardiogram (ECG) (EKG) 06/26/2013     No family history on file.   Current Outpatient Medications:  .  amLODipine (NORVASC) 10 MG tablet, TAKE ONE TABLET BY MOUTH EVERY DAY, Disp: 90 tablet, Rfl: 0 .  aspirin 81 MG EC tablet, Take 1 tablet by mouth daily., Disp: , Rfl:  .  atorvastatin (LIPITOR) 40 MG tablet, , Disp: , Rfl: 0 .  Cholecalciferol (VITAMIN D3) 25 MCG (1000 UT) CAPS, Take 1,000 Units by mouth daily., Disp: , Rfl:  .  clopidogrel (PLAVIX) 75 MG tablet, Take 75 mg by mouth daily., Disp: , Rfl:  .  diclofenac Sodium (VOLTAREN) 1 % GEL, Apply 2 g topically 4 (four) times daily for 6 days., Disp: 50 g, Rfl:  0 .  ferrous sulfate 325 (65 FE) MG EC tablet, Take 325 mg by mouth 3 (three) times daily with meals., Disp: , Rfl:  .  FLUZONE HIGH-DOSE 0.5 ML injection, INJECT INTRAMUSCUALRY ONCE, Disp: , Rfl: 0 .  magnesium oxide (MAG-OX) 400 MG tablet, Take 400 mg by mouth in the morning, at noon, and at bedtime., Disp: , Rfl:  .  metFORMIN (GLUCOPHAGE) 500 MG tablet, Take 500 mg by mouth daily with breakfast., Disp: , Rfl:  .  metoprolol succinate (TOPROL-XL) 50 MG 24 hr tablet, Take 1 tablet (50 mg total) by mouth in the morning. Take with or immediately following a meal. Hold if top blood pressure number less than 100 mmHg or heart rate less than 60 bpm., Disp: 90 tablet, Rfl: 0 .  MITIGARE 0.6 MG CAPS, Take 1 capsule by mouth 3 (three) times daily as needed., Disp: 30 capsule, Rfl: 2 .  multivitamin-iron-minerals-folic acid (CENTRUM) chewable tablet, Chew 1 tablet by mouth daily., Disp: , Rfl:  .  potassium chloride SA (KLOR-CON) 20 MEQ tablet, Take 1 tablet (20 mEq total) by mouth daily., Disp: 90 tablet, Rfl: 0 .  traMADol (ULTRAM) 50 MG tablet, Take 2 tablets (100 mg total) by mouth every 6 (six) hours as needed., Disp: 40 tablet, Rfl: 0 .  allopurinol (ZYLOPRIM) 100 MG  tablet, Take 1.5 tablets (150 mg total) by mouth daily., Disp: 135 tablet, Rfl: 1 .  losartan (COZAAR) 25 MG tablet, Take 1 tablet (25 mg total) by mouth in the morning., Disp: 90 tablet, Rfl: 3  Current Facility-Administered Medications:  .  ketorolac (TORADOL) 30 MG/ML injection 30 mg, 30 mg, Intramuscular, Once, Arnette Felts, FNP .  triamcinolone acetonide (KENALOG-40) injection 40 mg, 40 mg, Intramuscular, Once, Arnette Felts, FNP   Allergies  Allergen Reactions  . Lisinopril Cough     Review of Systems  Constitutional: Negative.   HENT: Negative.   Eyes: Negative.   Respiratory: Negative.   Cardiovascular: Positive for leg swelling.  Gastrointestinal: Negative.   Endocrine: Negative.   Genitourinary: Negative.    Musculoskeletal: Negative.   Skin: Negative.   Neurological: Negative.   Hematological: Negative.   Psychiatric/Behavioral: Negative.      Today's Vitals   06/25/20 1533  BP: (!) 142/78  Pulse: 64  Temp: 98.3 F (36.8 C)  SpO2: 96%  Weight: 190 lb (86.2 kg)  Height: 5\' 4"  (1.626 m)  PainSc: 8    Body mass index is 32.61 kg/m.   Objective:  Physical Exam Constitutional:      General: He is not in acute distress.    Appearance: Normal appearance. He is normal weight.  Cardiovascular:     Rate and Rhythm: Normal rate and regular rhythm.     Pulses: Normal pulses.     Heart sounds: Normal heart sounds. No murmur heard.   Pulmonary:     Effort: Pulmonary effort is normal. No respiratory distress.     Breath sounds: Normal breath sounds. No wheezing.  Musculoskeletal:        General: Swelling (mild swelling to right foot/leg) present. Normal range of motion.     Cervical back: Normal range of motion and neck supple.  Skin:    General: Skin is warm and dry.     Capillary Refill: Capillary refill takes less than 2 seconds.  Neurological:     General: No focal deficit present.     Mental Status: He is alert and oriented to person, place, and time.     Cranial Nerves: No cranial nerve deficit.     Motor: No weakness.  Psychiatric:        Mood and Affect: Mood normal.        Behavior: Behavior normal.        Thought Content: Thought content normal.        Judgment: Judgment normal.         Assessment And Plan:     1. Pain of right lower extremity  Will check uric acid to see if this is a gout flare  He is feeling better since being seen at the ER  Will treat with kenalog and toradol IM - Uric acid - ketorolac (TORADOL) 30 MG/ML injection 30 mg - triamcinolone acetonide (KENALOG-40) injection 40 mg  2. 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  Dispense: 135 tablet; Refill: 1     Patient was given opportunity to ask questions. Patient verbalized understanding of the plan and was able to repeat key elements of the plan. All questions were answered to their satisfaction.  Arnette Felts, FNP   I, Arnette Felts, FNP, have reviewed all documentation for this visit. The documentation on 06/25/20 for the exam, diagnosis, procedures, and orders are all accurate and complete.   THE PATIENT IS ENCOURAGED TO PRACTICE SOCIAL DISTANCING DUE TO THE COVID-19 PANDEMIC.

## 2020-06-26 LAB — URIC ACID: Uric Acid: 5 mg/dL (ref 3.8–8.4)

## 2020-06-30 NOTE — Progress Notes (Signed)
Ask him if he has an orthopedic? If not would he like for me to refer him to one to evaluate his knee further? If he does he needs to call them and schedule an appt.

## 2020-07-04 ENCOUNTER — Other Ambulatory Visit: Payer: Self-pay | Admitting: Cardiology

## 2020-07-04 ENCOUNTER — Ambulatory Visit: Payer: 59 | Admitting: Cardiology

## 2020-07-04 DIAGNOSIS — I10 Essential (primary) hypertension: Secondary | ICD-10-CM

## 2020-07-05 ENCOUNTER — Telehealth: Payer: Self-pay

## 2020-07-05 NOTE — Telephone Encounter (Signed)
He is a diabetic.  Could he be sent to [facility] for extra-depth shoes or shoes that diabetics wear?

## 2020-07-05 NOTE — Telephone Encounter (Signed)
Patient's family member, Mrs.O called triage phone. She states that patient is wanting to be sent somewhere to have custom made shoes made for him. She said that Dr.Blackman just recently saw him for his knee, as well as his elbow. Patient thinks that having custom shoes would benefit his ambulation. She can be reached at [phone]. Thanks!

## 2020-07-08 ENCOUNTER — Encounter: Payer: Self-pay | Admitting: Internal Medicine

## 2020-07-08 NOTE — Telephone Encounter (Signed)
Faxed order to [facility] and LMOM for patient letting him know I did so

## 2020-07-11 ENCOUNTER — Telehealth: Payer: Self-pay

## 2020-07-11 NOTE — Telephone Encounter (Signed)
Family member called stating that order to [facility] was not received.  Would like to know if order could be sent again.  CB# [phone].  Please advise.  Thank you.

## 2020-07-12 ENCOUNTER — Encounter: Payer: Self-pay | Admitting: Internal Medicine

## 2020-07-12 NOTE — Telephone Encounter (Signed)
Resent

## 2020-07-19 ENCOUNTER — Encounter: Payer: Self-pay | Admitting: Internal Medicine

## 2020-07-21 ENCOUNTER — Encounter: Payer: Self-pay | Admitting: Nurse Practitioner

## 2020-07-26 ENCOUNTER — Telehealth: Payer: Self-pay

## 2020-07-26 NOTE — Telephone Encounter (Signed)
The pt's family member Ms. Val Eagle said the pt said  to put her number down as the primary contact number. Ms. Val Eagle said that the pt has already been to the ortho Dr Magnus Ivan and is still hurting. He went there before his march 1st appt and then he ended up going to the ER.

## 2020-08-13 ENCOUNTER — Other Ambulatory Visit: Payer: Self-pay

## 2020-08-13 ENCOUNTER — Ambulatory Visit (INDEPENDENT_AMBULATORY_CARE_PROVIDER_SITE_OTHER): Payer: PPO | Admitting: Internal Medicine

## 2020-08-13 ENCOUNTER — Encounter: Payer: Self-pay | Admitting: Internal Medicine

## 2020-08-13 VITALS — BP 136/88 | HR 78 | Temp 98.0°F | Ht 65.2 in | Wt 189.8 lb

## 2020-08-13 DIAGNOSIS — Z23 Encounter for immunization: Secondary | ICD-10-CM

## 2020-08-13 DIAGNOSIS — E1169 Type 2 diabetes mellitus with other specified complication: Secondary | ICD-10-CM | POA: Diagnosis not present

## 2020-08-13 DIAGNOSIS — E119 Type 2 diabetes mellitus without complications: Secondary | ICD-10-CM

## 2020-08-13 DIAGNOSIS — I2510 Atherosclerotic heart disease of native coronary artery without angina pectoris: Secondary | ICD-10-CM

## 2020-08-13 DIAGNOSIS — Z0000 Encounter for general adult medical examination without abnormal findings: Secondary | ICD-10-CM

## 2020-08-13 DIAGNOSIS — E6609 Other obesity due to excess calories: Secondary | ICD-10-CM

## 2020-08-13 DIAGNOSIS — I10 Essential (primary) hypertension: Secondary | ICD-10-CM

## 2020-08-13 DIAGNOSIS — E782 Mixed hyperlipidemia: Secondary | ICD-10-CM | POA: Diagnosis not present

## 2020-08-13 DIAGNOSIS — Z87891 Personal history of nicotine dependence: Secondary | ICD-10-CM

## 2020-08-13 DIAGNOSIS — I119 Hypertensive heart disease without heart failure: Secondary | ICD-10-CM

## 2020-08-13 DIAGNOSIS — Z6831 Body mass index (BMI) 31.0-31.9, adult: Secondary | ICD-10-CM | POA: Diagnosis not present

## 2020-08-13 LAB — POCT URINALYSIS DIPSTICK
Bilirubin, UA: NEGATIVE
Glucose, UA: POSITIVE — AB
Ketones, UA: NEGATIVE
Leukocytes, UA: NEGATIVE
Nitrite, UA: NEGATIVE
Protein, UA: POSITIVE — AB
Spec Grav, UA: 1.02 (ref 1.010–1.025)
Urobilinogen, UA: 0.2 E.U./dL
pH, UA: 8 (ref 5.0–8.0)

## 2020-08-13 LAB — POCT UA - MICROALBUMIN
Albumin/Creatinine Ratio, Urine, POC: >300
Creatinine, POC: 50 mg/dL
Microalbumin Ur, POC: 150 mg/L

## 2020-08-13 MED ORDER — ATORVASTATIN CALCIUM 40 MG PO TABS: 40.0000 mg | ORAL_TABLET | Freq: Every day | ORAL | 2 refills | Status: DC

## 2020-08-13 MED ORDER — AMLODIPINE BESYLATE 10 MG PO TABS: 1.0000 | ORAL_TABLET | Freq: Every day | ORAL | 2 refills | Status: DC

## 2020-08-13 MED ORDER — METOPROLOL SUCCINATE ER 50 MG PO TB24: 50.0000 mg | ORAL_TABLET | Freq: Every morning | ORAL | 0 refills | Status: DC

## 2020-08-13 MED ORDER — POTASSIUM CHLORIDE CRYS ER 20 MEQ PO TBCR: 20.0000 meq | EXTENDED_RELEASE_TABLET | Freq: Every day | ORAL | 1 refills | Status: DC

## 2020-08-13 MED ORDER — METFORMIN HCL 500 MG PO TABS: 500.0000 mg | ORAL_TABLET | Freq: Every day | ORAL | 2 refills | Status: DC

## 2020-08-13 NOTE — Progress Notes (Signed)
I,Katawbba Wiggins,acting as a Education administrator for Maximino Greenland, MD.,have documented all relevant documentation on the behalf of Maximino Greenland, MD,as directed by  Maximino Greenland, MD while in the presence of Maximino Greenland, MD.  This visit occurred during the SARS-CoV-2 public health emergency.  Safety protocols were in place, including screening questions prior to the visit, additional usage of staff PPE, and extensive cleaning of exam room while observing appropriate contact time as indicated for disinfecting solutions.  Subjective:     Patient ID: Thomas Rowland , male    DOB: 08/30/1955 , 65 y.o.   MRN: [id number]   Chief Complaint  Patient presents with  . Annual Exam  . Diabetes  . Hypertension    HPI  The patient is here today for a physical examination.  He would like for his niece to come in once exam is over. He offers no complaints. Reports compliance with meds.    Hypertension This is a chronic problem. The current episode started more than 1 year ago. The problem has been gradually worsening since onset. The problem is uncontrolled. Pertinent negatives include no anxiety, blurred vision, headaches or palpitations. There are no associated agents to hypertension. Risk factors for coronary artery disease include obesity and diabetes mellitus. Past treatments include diuretics. The current treatment provides no improvement. There are no compliance problems.  There is no history of angina.  Diabetes He presents for his follow-up diabetic visit. He has type 2 diabetes mellitus. There are no hypoglycemic associated symptoms. Pertinent negatives for hypoglycemia include no dizziness or headaches. Pertinent negatives for diabetes include no blurred vision, no fatigue, no polydipsia, no polyphagia and no polyuria. Symptoms are stable.     Past Medical History:  Diagnosis Date  . Coronary artery disease   . Diabetes mellitus without complication ([place])   . History of COVID-19   .  Hyperlipidemia   . Hypertension   . Nonspecific abnormal electrocardiogram (ECG) (EKG) 06/26/2013     History reviewed. No pertinent family history.   Current Outpatient Medications:  .  allopurinol (ZYLOPRIM) 100 MG tablet, Take 1.5 tablets (150 mg total) by mouth daily., Disp: 135 tablet, Rfl: 1 .  aspirin 81 MG EC tablet, Take 1 tablet by mouth daily., Disp: , Rfl:  .  Cholecalciferol (VITAMIN D3) 25 MCG (1000 UT) CAPS, Take 1,000 Units by mouth daily., Disp: , Rfl:  .  clopidogrel (PLAVIX) 75 MG tablet, Take 75 mg by mouth daily., Disp: , Rfl:  .  losartan (COZAAR) 25 MG tablet, Take 1 tablet (25 mg total) by mouth in the morning., Disp: 90 tablet, Rfl: 3 .  magnesium oxide (MAG-OX) 400 MG tablet, Take 400 mg by mouth 2 (two) times daily., Disp: , Rfl:  .  MITIGARE 0.6 MG CAPS, Take 1 capsule by mouth 3 (three) times daily as needed., Disp: 30 capsule, Rfl: 2 .  multivitamin-iron-minerals-folic acid (CENTRUM) chewable tablet, Chew 1 tablet by mouth daily., Disp: , Rfl:  .  amLODipine (NORVASC) 10 MG tablet, Take 1 tablet (10 mg total) by mouth daily., Disp: 90 tablet, Rfl: 2 .  atorvastatin (LIPITOR) 40 MG tablet, Take 1 tablet (40 mg total) by mouth daily., Disp: 90 tablet, Rfl: 2 .  loratadine (CLARITIN) 10 MG tablet, Take 10 mg by mouth daily., Disp: , Rfl:  .  metFORMIN (GLUCOPHAGE) 500 MG tablet, Take 1 tablet (500 mg total) by mouth daily with breakfast., Disp: 90 tablet, Rfl: 2 .  metoprolol succinate (TOPROL XL)  25 MG 24 hr tablet, Take 1 tablet (25 mg total) by mouth daily. Hold if top blood pressure number less than 100 mmHg or pulse less than 60 bpm., Disp: 30 tablet, Rfl: 0 .  potassium chloride SA (KLOR-CON) 20 MEQ tablet, Take 1 tablet (20 mEq total) by mouth daily., Disp: 90 tablet, Rfl: 1   Allergies  Allergen Reactions  . Lisinopril Cough    Men's preventive visit. Patient Health Questionnaire (PHQ-2) is  [place] [facility] Visit from 09/12/2019 in [facility]  PHQ-2 Total Score 0    . Patient is on a low salt diet. Marital status: Married. Relevant history for alcohol use is:  Social History   Substance and Sexual Activity  Alcohol Use Yes   Comment: occ  . Relevant history for tobacco use is:  Social History   Tobacco Use  Smoking Status Former Smoker  . Packs/day: 0.25  . Years: 30.00  . Pack years: 7.50  . Types: Cigarettes, E-cigarettes  . Start date: 29  . Quit date: 06/26/2001  . Years since quitting: 19.1  Smokeless Tobacco Never Used  .  Marland Kitchen The patient's tobacco use is:  Social History   Tobacco Use  Smoking Status Former Smoker  . Packs/day: 0.25  . Years: 30.00  . Pack years: 7.50  . Types: Cigarettes, E-cigarettes  . Start date: 63  . Quit date: 06/26/2001  . Years since quitting: 19.1  Smokeless Tobacco Never Used   .    Review of Systems  Constitutional: Negative.  Negative for fatigue.  HENT: Negative.   Eyes: Negative.  Negative for blurred vision.  Respiratory: Negative.   Cardiovascular: Negative.  Negative for palpitations.  Endocrine: Negative.  Negative for polydipsia, polyphagia and polyuria.  Genitourinary: Negative.   Musculoskeletal: Negative.   Skin: Negative.   Allergic/Immunologic: Negative.   Neurological: Negative.  Negative for dizziness and headaches.  Hematological: Negative.   Psychiatric/Behavioral: Negative.      Today's Vitals   08/13/20 1045  BP: 136/88  Pulse: 78  Temp: 98 F (36.7 C)  TempSrc: Oral  Weight: 189 lb 12.8 oz (86.1 kg)  Height: 5' 5.2" (1.656 m)   Body mass index is 31.39 kg/m.  Wt Readings from Last 3 Encounters:  08/20/20 186 lb 3.2 oz (84.5 kg)  08/13/20 189 lb 12.8 oz (86.1 kg)  06/25/20 190 lb (86.2 kg)   Objective:  Physical Exam Vitals and nursing note reviewed. Exam conducted with a chaperone present.  Constitutional:      Appearance: Normal appearance. He is obese.  HENT:     Head: Normocephalic and atraumatic.      Right Ear: Tympanic membrane, ear canal and external ear normal.     Left Ear: Tympanic membrane, ear canal and external ear normal.     Nose:     Comments: Masked     Mouth/Throat:     Comments: Masked  Eyes:     Extraocular Movements: Extraocular movements intact.     Conjunctiva/sclera: Conjunctivae normal.     Pupils: Pupils are equal, round, and reactive to light.  Cardiovascular:     Rate and Rhythm: Normal rate and regular rhythm.     Pulses: Normal pulses.          Dorsalis pedis pulses are 2+ on the right side and 2+ on the left side.     Heart sounds: Normal heart sounds.  Pulmonary:     Effort: Pulmonary effort is normal.  Breath sounds: Normal breath sounds.  Chest:  Breasts:     Right: Normal. No swelling, bleeding, inverted nipple, mass or nipple discharge.     Left: Normal. No swelling, bleeding, inverted nipple, mass or nipple discharge.    Abdominal:     General: Bowel sounds are normal.     Palpations: Abdomen is soft.     Comments: Rounded, soft  Genitourinary:    Prostate: Enlarged. Not tender and no nodules present.     Rectum: Normal.  Musculoskeletal:        General: Normal range of motion.     Cervical back: Normal range of motion and neck supple.  Feet:     Right foot:     Protective Sensation: 5 sites tested. 5 sites sensed.     Skin integrity: Callus and dry skin present.     Toenail Condition: Right toenails are abnormally thick.     Left foot:     Protective Sensation: 5 sites tested. 5 sites sensed.     Skin integrity: Callus and dry skin present.     Toenail Condition: Left toenails are abnormally thick.  Skin:    General: Skin is warm.  Neurological:     General: No focal deficit present.     Mental Status: He is alert.  Psychiatric:        Mood and Affect: Mood normal.        Behavior: Behavior normal.         Assessment And Plan:     1. Routine general medical examination at a health care facility Comments: A full exam was  performed. DRE performed, prostate is firm, enlarged. Stool is heme negative. PATIENT IS ADVISED TO GET 30-45 MINUTES REGULAR EXERCISE NO LESS THAN FOUR TO FIVE DAYS PER WEEK - BOTH WEIGHTBEARING EXERCISES AND AEROBIC ARE RECOMMENDED.  PATIENT IS ADVISED TO FOLLOW A HEALTHY DIET WITH AT LEAST SIX FRUITS/VEGGIES PER DAY, DECREASE INTAKE OF RED MEAT, AND TO INCREASE FISH INTAKE TO TWO DAYS PER WEEK.  MEATS/FISH SHOULD NOT BE FRIED, BAKED OR BROILED IS PREFERABLE.  IT IS ALSO IMPORTANT TO CUT BACK ON YOUR SUGAR INTAKE. PLEASE AVOID ANYTHING WITH ADDED SUGAR, CORN SYRUP OR OTHER SWEETENERS. IF YOU MUST USE A SWEETENER, YOU CAN TRY STEVIA. IT IS ALSO IMPORTANT TO AVOID ARTIFICIALLY SWEETENERS AND DIET BEVERAGES. LASTLY, I SUGGEST WEARING SPF 50 SUNSCREEN ON EXPOSED PARTS AND ESPECIALLY WHEN IN THE DIRECT SUNLIGHT FOR AN EXTENDED PERIOD OF TIME.  PLEASE AVOID FAST FOOD RESTAURANTS AND INCREASE YOUR WATER INTAKE.  - CBC - Hemoglobin A1c - PSA - BMP8+EGFR - POC Hemoccult Bld/Stl (1-Cd Office Dx)  2. Hypertensive heart disease without heart failure Chronic, fair control. EKG performed, NSR w/ extensive T-abnormality, possible Anterolateral and inferior ischemia. He is encouraged to keep up coming appt with cardiology. Advised to follow low sodium diet.  - EKG 12-Lead  3. Type 2 diabetes mellitus with other specified complication, without long-term current use of insulin (HCC) Comments: Diabetic foot exam was performed. I will check labs as listed below. I will refer him for diabetic eye exam. I DISCUSSED WITH THE PATIENT AT LENGTH REGARDING THE GOALS OF GLYCEMIC CONTROL AND POSSIBLE LONG-TERM COMPLICATIONS.  I  ALSO STRESSED THE IMPORTANCE OF COMPLIANCE WITH HOME GLUCOSE MONITORING, DIETARY RESTRICTIONS INCLUDING AVOIDANCE OF SUGARY DRINKS/PROCESSED FOODS,  ALONG WITH REGULAR EXERCISE.  I  ALSO STRESSED THE IMPORTANCE OF ANNUAL EYE EXAMS, SELF FOOT CARE AND COMPLIANCE WITH OFFICE VISITS.  - POCT Urinalysis  Dipstick (81002) - POCT UA - Microalbumin - Ambulatory referral to Ophthalmology  4. Atherosclerosis of native coronary artery of native heart without angina pectoris Comments: Chronic, advised to follow heart healthy lifestyle. He is advised to follow Mediterranean diet, exercise at least 150 minutes/wk, avoid tobacco,and increase fib  5. Mixed hyperlipidemia Previous results from Jan 2022 reviewed, LDL 90. Pt advised goal LDL is less than 70. He will c/w atorvastatin for now. Advised to increase exercise and fiber intake. May need to add Zetia vs. Nexletol for him to get to goal.   6. Class 1 obesity due to excess calories with serious comorbidity and body mass index (BMI) of 31.0 to 31.9 in adult Comments: He is encouraged to initially strive for BMI less than 30 to decrease cardiac risk. He is advised to exercise no less than 150 minutes per week.   7. Immunization due Comments: He was given Tdap to update his immunization history.  - Tdap vaccine greater than or equal to 7yo IM  I personally spent 45 minutes face-to-face and non-face-to-face in the care of this patient, which includes all pre-, intra-, and post visit time on the date of service. After physical was complete, I spent another 20 minutes with pt and his niece going over previous labs, disease state, etc.   Patient was given opportunity to ask questions. Patient verbalized understanding of the plan and was able to repeat key elements of the plan. All questions were answered to their satisfaction.    I, Maximino Greenland, MD, have reviewed all documentation for this visit. The documentation on 08/13/20 for the exam, diagnosis, procedures, and orders are all accurate and complete.  THE PATIENT IS ENCOURAGED TO PRACTICE SOCIAL DISTANCING DUE TO THE COVID-19 PANDEMIC.

## 2020-08-13 NOTE — Patient Instructions (Signed)

## 2020-08-14 LAB — CBC
Hematocrit: 44.7 % (ref 37.5–51.0)
Hemoglobin: 14.6 g/dL (ref 13.0–17.7)
MCH: 27.5 pg (ref 26.6–33.0)
MCHC: 32.7 g/dL (ref 31.5–35.7)
MCV: 84 fL (ref 79–97)
Platelets: 160 10*3/uL (ref 150–450)
RBC: 5.3 x10E6/uL (ref 4.14–5.80)
RDW: 13.1 % (ref 11.6–15.4)
WBC: 9 10*3/uL (ref 3.4–10.8)

## 2020-08-14 LAB — BMP8+EGFR
BUN/Creatinine Ratio: 14 (ref 10–24)
BUN: 16 mg/dL (ref 8–27)
CO2: 23 mmol/L (ref 20–29)
Calcium: 9.3 mg/dL (ref 8.6–10.2)
Chloride: 100 mmol/L (ref 96–106)
Creatinine, Ser: 1.16 mg/dL (ref 0.76–1.27)
Glucose: 117 mg/dL — ABNORMAL HIGH (ref 65–99)
Potassium: 4.4 mmol/L (ref 3.5–5.2)
Sodium: 138 mmol/L (ref 134–144)
eGFR: 70 mL/min/{1.73_m2} (ref >59)

## 2020-08-14 LAB — HEMOGLOBIN A1C
Est. average glucose Bld gHb Est-mCnc: 117 mg/dL
Hgb A1c MFr Bld: 5.7 % — ABNORMAL HIGH (ref 4.8–5.6)

## 2020-08-14 LAB — PSA: Prostate Specific Ag, Serum: 3.5 ng/mL (ref 0.0–4.0)

## 2020-08-19 NOTE — Progress Notes (Signed)
ID:  Thomas Rowland, DOB 12-18-1955, MRN [id number]  PCP:  Thomas Rowland, Robyn, MD  Cardiologist: Thomas LernerSunit Ilayda Toda, DO, FACC(established care 10/13/2019) Former Cardiology Providers: Dr. Yates DecampJay Rowland and Dr. Jordan HawksLuke Allen Rowland.   Date: 08/20/20 Last Office Visit: 05/30/2020  Chief Complaint  Patient presents with  . Bradycardia  . Follow-up    HPI  Thomas Rowland is a 65 y.o. 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  In March 2020 while he was under the care of Dr. Kennon RoundsLuke Rowland he had an abnormal nuclear stress test and underwent elective angiography and was found to have obstructive CAD.  He has had coronary interventions to both the RCA (2 PCI) and the LCx (1 PCI).   Patient reestablish care with [facility] in June 2021 and has been followed up for management of his CAD.  At the last office visit he was noted to have bradycardia and we recommended decreasing his metoprolol succinate from 100 mg p.o. daily to 50 mg p.o. daily and to reevaluate in a month.  Despite being on a lower dose of Toprol-XL he continues to be bradycardic.  In regard to symptoms he denies being tired, fatigued, shortness of breath, chest pain, near-syncope or syncopal events.  No hospitalizations or urgent care visits since last time he was evaluated in the office.  FUNCTIONAL STATUS: No structured exercise program or daily routine. 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    ALLERGIES: Allergies  Allergen Reactions  . Lisinopril Cough    MEDICATION LIST PRIOR TO VISIT: Current Meds  Medication Sig  . allopurinol (ZYLOPRIM) 100 MG tablet  Take 1.5 tablets (150 mg total) by mouth daily.  Marland Kitchen. amLODipine (NORVASC) 10 MG tablet Take 1 tablet (10 mg total) by mouth daily.  Marland Kitchen. aspirin 81 MG EC tablet Take 1 tablet by mouth daily.  Marland Kitchen. atorvastatin (LIPITOR) 40 MG tablet Take 1 tablet (40 mg total) by mouth daily.  . Cholecalciferol (VITAMIN D3) 25 MCG (1000 UT) CAPS Take 1,000 Units by mouth daily.  . clopidogrel (PLAVIX) 75 MG tablet Take 75 mg by mouth daily.  Marland Kitchen. loratadine (CLARITIN) 10 MG tablet Take 10 mg by mouth daily.  Marland Kitchen. losartan (COZAAR) 25 MG tablet Take 1 tablet (25 mg total) by mouth in the morning.  . magnesium oxide (MAG-OX) 400 MG tablet Take 400 mg by mouth 2 (two) times daily.  . metFORMIN (GLUCOPHAGE) 500 MG tablet Take 1 tablet (500 mg total) by mouth daily with breakfast.  . metoprolol succinate (TOPROL XL) 25 MG 24 hr tablet Take 1 tablet (25 mg total) by mouth daily. Hold if top blood pressure number less than 100 mmHg or pulse less than 60 bpm.  . MITIGARE 0.6 MG CAPS Take 1 capsule by mouth 3 (three) times daily as needed.  . multivitamin-iron-minerals-folic acid (CENTRUM) chewable tablet Chew 1 tablet by mouth daily.  . potassium chloride SA (KLOR-CON) 20 MEQ tablet Take 1 tablet (20 mEq total) by mouth daily.  . [DISCONTINUED] metoprolol succinate (TOPROL-XL) 50 MG 24 hr tablet Take 1 tablet (50 mg total) by mouth in the morning. Take with or immediately following a meal. Hold if top blood pressure number less than 100 mmHg or heart rate  less than 60 bpm.     PAST MEDICAL HISTORY: Past Medical History:  Diagnosis Date  . Coronary artery disease   . Diabetes mellitus without complication (HCC)   . History of COVID-19   . Hyperlipidemia   . Hypertension   . Nonspecific abnormal electrocardiogram (ECG) (EKG) 06/26/2013    PAST SURGICAL HISTORY: Past Surgical History:  Procedure Laterality Date  . COLONOSCOPY  2014   Normal  . CORONARY ANGIOPLASTY WITH STENT PLACEMENT    . LEG SURGERY Left 1997   [place]     FAMILY HISTORY: The patient family history is not on file.  SOCIAL HISTORY:  The patient  reports that he quit smoking about 19 years ago. His smoking use included cigarettes and e-cigarettes. He started smoking about 52 years ago. He has a 7.50 pack-year smoking history. He has never used smokeless tobacco. He reports current alcohol use. He reports that he does not use drugs.  REVIEW OF SYSTEMS: Review of Systems  Constitutional: Negative for chills and fever.  HENT: Negative for hoarse voice and nosebleeds.   Eyes: Negative for discharge, double vision and pain.  Cardiovascular: Negative for chest pain, claudication, dyspnea on exertion, leg swelling, near-syncope, orthopnea, palpitations, paroxysmal nocturnal dyspnea and syncope.  Respiratory: Negative for hemoptysis and shortness of breath.   Musculoskeletal: Negative for muscle cramps and myalgias.  Gastrointestinal: Negative for abdominal pain, constipation, diarrhea, hematemesis, hematochezia, melena, nausea and vomiting.  Neurological: Negative for dizziness and light-headedness.    PHYSICAL EXAM: Vitals with BMI 08/20/2020 08/13/2020 06/25/2020  Height 5\' 5"  5' 5.2" 5\' 4"   Weight 186 lbs 3 oz 189 lbs 13 oz 190 lbs  BMI 30.99 31.39 32.6  Systolic 138 136  Diastolic 81 88 78  Pulse 57 78 64   CONSTITUTIONAL: Well-developed and well-nourished. No acute distress.  SKIN: Skin is warm and dry. No rash noted. No cyanosis. No pallor. No jaundice HEAD: Normocephalic and atraumatic.  EYES: No scleral icterus MOUTH/THROAT: Moist oral membranes.  NECK: No JVD present. No thyromegaly noted. No carotid bruits  LYMPHATIC: No visible cervical adenopathy.  CHEST Normal respiratory effort. No intercostal retractions  LUNGS: Clear to auscultation bilaterally.  No stridor. No wheezes. No rales.  CARDIOVASCULAR: Regular, bradycardia, positive S1-S2, no murmurs rubs or gallops appreciated.  ABDOMINAL: Soft, nontender, nondistended,  positive bowel sounds all 4 quadrants. No apparent ascites.  EXTREMITIES: No peripheral edema  HEMATOLOGIC: No significant bruising NEUROLOGIC: Oriented to person, place, and time. Nonfocal. Normal muscle tone.  PSYCHIATRIC: Normal mood and affect. Normal behavior. Cooperative  CARDIAC DATABASE: EKG: 08/20/2020: Sinus bradycardia, 49 bpm, diffuse T wave abnormality anterolateral and inferolateral, without underlying injury pattern.    Echocardiogram: 10/20/2019: LVEF 50-55%, moderate LVH, indeterminate diastolic filling pattern, mildly dilated left atrium, mild AR, mild MR, mild TR, mild PR.  Stress Testing: 06/15/2019 from care everywhere (per report):  1. Potential small area of pharmacologically induced ischemia involving the mid aspect of the inferior wall of the left ventricle.  2. No scintigraphic evidence of prior infarction.  3. Mild hypokinesia involving the inferior wall of the left ventricle.  4. Ejection fraction - 50%.  Heart Catheterization: 07/15/2018 LHC with Dr. 06/17/2019 at [facility] (care everywhere): "Angiography revealed mild LAD, 80% mLCx, 95% mRCA. Stented RCA with 3.5x18 and 3.5x12 Onyx DES with good angiographic result. Stented LCX with a 3.5x15 Onyx DES with good angiographic result."  LABORATORY DATA: CBC Latest Ref Rng & Units 08/13/2020 08/08/2010  WBC 3.4 - 10.8 x10E3/uL 9.0 10.5  Hemoglobin 13.0 - 17.7 g/dL 45.8 09.9  Hematocrit 83.3 - 51.0 % 44.7 38.7(L)  Platelets 150 - 450 x10E3/uL 160 163    CMP Latest Ref Rng & Units 08/13/2020 05/09/2020 02/14/2020  Glucose 65 - 99 mg/dL 825(K) 99 539(J)  BUN 8 - 27 mg/dL 16 18 22   Creatinine 0.76 - 1.27 mg/dL 6.73 4.19)  Sodium 134 - 144 mmol/L 138 142 140  Potassium 3.5 - 5.2 mmol/L 4.4 4.7 4.8  Chloride 96 - 106 mmol/L 100 104 104  CO2 20 - 29 mmol/L 23 24 25   Calcium 8.6 - 10.2 mg/dL 9.3 8.9 9.3  Total Protein 6.0 - 8.5 g/dL - 6.9 -  Total Bilirubin 0.0 - 1.2 mg/dL - 0.3 -  Alkaline Phos 44 - 121 IU/L - 68 -   AST 0 - 40 IU/L - 22 -  ALT 0 - 44 IU/L - 22 -    Lipid Panel     Component Value Date/Time   CHOL 180 05/09/2020 1648   TRIG 67 05/09/2020 1648   HDL 69 05/09/2020 1648   CHOLHDL 2.6 05/09/2020 1648   LDLCALC 98 05/09/2020 1648   LABVLDL 13 05/09/2020 1648    No components found for: NTPROBNP No results for input(s): PROBNP in the last 8760 hours. Recent Labs    12/13/19 1615  TSH 1.590    HEMOGLOBIN A1C Lab Results  Component Value Date   HGBA1C 5.7 (H) 08/13/2020    IMPRESSION:    ICD-10-CM   1. Atherosclerosis of native coronary artery of native heart without angina pectoris  I25.10 metoprolol succinate (TOPROL XL) 25 MG 24 hr tablet    PCV MYOCARDIAL PERFUSION WITH LEXISCAN  2. History of coronary angioplasty with insertion of stent  Z95.5   3. Essential hypertension  I10   4. Mixed hyperlipidemia  E78.2   5. Former smoker  Z87.891   6. Non-insulin dependent type 2 diabetes mellitus (HCC)  E11.9   7. Hx of hyperaldosteronism  Z86.39   8. History of COVID-19  Z86.16   9. Bradycardia  R00.1 EKG 12-Lead     RECOMMENDATIONS: Thomas Rowland is a 65 y.o. 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  Atherosclerosis of the native coronary arteries with prior PCI's without angina pectoris:  Medications reconciled.  No angina or anginal equivalent since last office visit.  EKG shows T wave inversions in the high lateral and inferolateral leads.  Recommend nuclear stress test to evaluate for reversible ischemia given the EKG findings and bradycardia.  Bradycardia:  Asymptomatic.  Discontinue Toprol-XL 50 mg p.o. daily.  Prescribed Toprol-XL 25 mg p.o. daily   Reviewed the symptoms of symptomatic bradycardia  and if they surface he is asked to call the office or make a sooner appointment for further evaluation.    Patient has had prior coronary interventions to both the RCA and the LCx distribution.  Will order nuclear stress test to rule out reversible ischemia for further evaluation.  We will continue to monitor.  History of coronary artery stents: Stable  Benign essential hypertension:  Medications reconciled.  Office blood pressure is currently at goal.  Educated on importance of a low-salt diet.  Given his underlying chronic kidney disease recommended to avoid nephrotoxic agents such as NSAIDs.  Patient is asked to keep a log of his  blood pressures and to bring it in at the next office visit.  Mixed hyperlipidemia: Continue statin therapy.  He does not endorse myalgias.  Most recent lipid profile reviewed.  Recommend an LDL of less than 70 mg/dL.  Former smoker: Educated on the importance of continued smoking cessation.  Non-insulin-dependent diabetes: Currently managed by primary care provider.  Patient educated on importance of glycemic control given his underlying CAD.  FINAL MEDICATION LIST END OF ENCOUNTER: Meds ordered this encounter  Medications  . metoprolol succinate (TOPROL XL) 25 MG 24 hr tablet    Sig: Take 1 tablet (25 mg total) by mouth daily. Hold if top blood pressure number less than 100 mmHg or pulse less than 60 bpm.    Dispense:  30 tablet    Refill:  0     Current Outpatient Medications:  .  allopurinol (ZYLOPRIM) 100 MG tablet, Take 1.5 tablets (150 mg total) by mouth daily., Disp: 135 tablet, Rfl: 1 .  amLODipine (NORVASC) 10 MG tablet, Take 1 tablet (10 mg total) by mouth daily., Disp: 90 tablet, Rfl: 2 .  aspirin 81 MG EC tablet, Take 1 tablet by mouth daily., Disp: , Rfl:  .  atorvastatin (LIPITOR) 40 MG tablet, Take 1 tablet (40 mg total) by mouth daily., Disp: 90 tablet, Rfl: 2 .  Cholecalciferol (VITAMIN D3) 25 MCG (1000 UT) CAPS, Take 1,000 Units  by mouth daily., Disp: , Rfl:  .  clopidogrel (PLAVIX) 75 MG tablet, Take 75 mg by mouth daily., Disp: , Rfl:  .  loratadine (CLARITIN) 10 MG tablet, Take 10 mg by mouth daily., Disp: , Rfl:  .  losartan (COZAAR) 25 MG tablet, Take 1 tablet (25 mg total) by mouth in the morning., Disp: 90 tablet, Rfl: 3 .  magnesium oxide (MAG-OX) 400 MG tablet, Take 400 mg by mouth 2 (two) times daily., Disp: , Rfl:  .  metFORMIN (GLUCOPHAGE) 500 MG tablet, Take 1 tablet (500 mg total) by mouth daily with breakfast., Disp: 90 tablet, Rfl: 2 .  metoprolol succinate (TOPROL XL) 25 MG 24 hr tablet, Take 1 tablet (25 mg total) by mouth daily. Hold if top blood pressure number less than 100 mmHg or pulse less than 60 bpm., Disp: 30 tablet, Rfl: 0 .  MITIGARE 0.6 MG CAPS, Take 1 capsule by mouth 3 (three) times daily as needed., Disp: 30 capsule, Rfl: 2 .  multivitamin-iron-minerals-folic acid (CENTRUM) chewable tablet, Chew 1 tablet by mouth daily., Disp: , Rfl:  .  potassium chloride SA (KLOR-CON) 20 MEQ tablet, Take 1 tablet (20 mEq total) by mouth daily., Disp: 90 tablet, Rfl: 1  Orders Placed This Encounter  Procedures  . PCV MYOCARDIAL PERFUSION WITH LEXISCAN  . EKG 12-Lead   There are no Patient Instructions on file for this visit.   --Continue cardiac medications as reconciled in final medication list. --Return in about 4 weeks (around 09/17/2020) for Follow up, CAD, bradycardia. Or sooner if needed. --Continue follow-up with your primary care physician regarding the management of your other chronic comorbid conditions.  Patient's questions and concerns were addressed to his satisfaction. He voices understanding of the instructions provided during this encounter.   This note was created using a voice recognition software as a result there may be grammatical errors inadvertently enclosed that do not reflect the nature of this encounter. Every attempt is made to correct such errors.  Thomas Lerner, [place],  [facility]  Pager: [phone] Office: [phone]

## 2020-08-20 ENCOUNTER — Encounter: Payer: Self-pay | Admitting: Cardiology

## 2020-08-20 ENCOUNTER — Other Ambulatory Visit: Payer: Self-pay

## 2020-08-20 ENCOUNTER — Ambulatory Visit: Payer: 59 | Admitting: Cardiology

## 2020-08-20 VITALS — BP 138/81 | HR 57 | Temp 98.4°F | Resp 17 | Ht 65.0 in | Wt 186.2 lb

## 2020-08-20 DIAGNOSIS — R001 Bradycardia, unspecified: Secondary | ICD-10-CM

## 2020-08-20 DIAGNOSIS — Z8639 Personal history of other endocrine, nutritional and metabolic disease: Secondary | ICD-10-CM

## 2020-08-20 DIAGNOSIS — Z955 Presence of coronary angioplasty implant and graft: Secondary | ICD-10-CM

## 2020-08-20 DIAGNOSIS — Z87891 Personal history of nicotine dependence: Secondary | ICD-10-CM

## 2020-08-20 DIAGNOSIS — Z8616 Personal history of COVID-19: Secondary | ICD-10-CM

## 2020-08-20 DIAGNOSIS — E782 Mixed hyperlipidemia: Secondary | ICD-10-CM

## 2020-08-20 DIAGNOSIS — I2510 Atherosclerotic heart disease of native coronary artery without angina pectoris: Secondary | ICD-10-CM

## 2020-08-20 DIAGNOSIS — I10 Essential (primary) hypertension: Secondary | ICD-10-CM

## 2020-08-20 DIAGNOSIS — E119 Type 2 diabetes mellitus without complications: Secondary | ICD-10-CM

## 2020-08-20 MED ORDER — METOPROLOL SUCCINATE ER 25 MG PO TB24: 25.0000 mg | ORAL_TABLET | Freq: Every day | ORAL | 0 refills | Status: DC

## 2020-08-25 LAB — POC HEMOCCULT BLD/STL (OFFICE/1-CARD/DIAGNOSTIC)
Card #1 Date: 4192022
Fecal Occult Blood, POC: NEGATIVE

## 2020-08-27 ENCOUNTER — Telehealth: Payer: Self-pay

## 2020-08-27 NOTE — Telephone Encounter (Signed)
I called the pt to get his parents medical history and to see if they are still living or not.

## 2020-09-06 ENCOUNTER — Encounter: Payer: Self-pay | Admitting: Internal Medicine

## 2020-09-11 ENCOUNTER — Other Ambulatory Visit: Payer: Self-pay

## 2020-09-11 ENCOUNTER — Ambulatory Visit: Payer: 59

## 2020-09-11 DIAGNOSIS — I2510 Atherosclerotic heart disease of native coronary artery without angina pectoris: Secondary | ICD-10-CM

## 2020-09-13 ENCOUNTER — Encounter: Payer: Self-pay | Admitting: Internal Medicine

## 2020-09-17 NOTE — Progress Notes (Signed)
No answer left a vm to call back

## 2020-09-18 ENCOUNTER — Other Ambulatory Visit: Payer: Self-pay | Admitting: Cardiology

## 2020-09-18 DIAGNOSIS — I2510 Atherosclerotic heart disease of native coronary artery without angina pectoris: Secondary | ICD-10-CM

## 2020-09-20 ENCOUNTER — Other Ambulatory Visit: Payer: Self-pay | Admitting: Nurse Practitioner

## 2020-09-20 DIAGNOSIS — M10472 Other secondary gout, left ankle and foot: Secondary | ICD-10-CM

## 2020-09-20 DIAGNOSIS — M25521 Pain in right elbow: Secondary | ICD-10-CM

## 2020-09-24 NOTE — Progress Notes (Signed)
Spoke to a family member she voiced understanding and would let the patient know

## 2020-09-30 ENCOUNTER — Encounter: Payer: Self-pay | Admitting: Cardiology

## 2020-09-30 ENCOUNTER — Ambulatory Visit: Payer: Self-pay | Admitting: Cardiology

## 2020-09-30 ENCOUNTER — Other Ambulatory Visit: Payer: Self-pay

## 2020-09-30 ENCOUNTER — Other Ambulatory Visit: Payer: Self-pay | Admitting: Cardiology

## 2020-09-30 VITALS — BP 147/84 | HR 70 | Temp 97.9°F | Resp 16 | Ht 65.0 in | Wt 190.8 lb

## 2020-09-30 DIAGNOSIS — Z8616 Personal history of COVID-19: Secondary | ICD-10-CM

## 2020-09-30 DIAGNOSIS — E782 Mixed hyperlipidemia: Secondary | ICD-10-CM | POA: Diagnosis not present

## 2020-09-30 DIAGNOSIS — Z8639 Personal history of other endocrine, nutritional and metabolic disease: Secondary | ICD-10-CM | POA: Diagnosis not present

## 2020-09-30 DIAGNOSIS — R9439 Abnormal result of other cardiovascular function study: Secondary | ICD-10-CM | POA: Diagnosis not present

## 2020-09-30 DIAGNOSIS — Z955 Presence of coronary angioplasty implant and graft: Secondary | ICD-10-CM

## 2020-09-30 DIAGNOSIS — I10 Essential (primary) hypertension: Secondary | ICD-10-CM

## 2020-09-30 DIAGNOSIS — R001 Bradycardia, unspecified: Secondary | ICD-10-CM | POA: Diagnosis not present

## 2020-09-30 DIAGNOSIS — E119 Type 2 diabetes mellitus without complications: Secondary | ICD-10-CM | POA: Diagnosis not present

## 2020-09-30 DIAGNOSIS — I2510 Atherosclerotic heart disease of native coronary artery without angina pectoris: Secondary | ICD-10-CM

## 2020-09-30 MED ORDER — NITROGLYCERIN 0.4 MG SL SUBL: 0.4000 mg | SUBLINGUAL_TABLET | SUBLINGUAL | 0 refills | Status: DC | PRN

## 2020-09-30 NOTE — Progress Notes (Signed)
ID:  Thomas Rowland, Rode 1956/04/08, MRN [id number]  PCP:  Dorothyann Peng, MD  Cardiologist: Tessa Lerner, DO, FACC(established care 10/13/2019) Former Cardiology Providers: Dr. Yates Decamp and Dr. Jordan Hawks.   Date: 10/01/20 Last Office Visit: 08/20/2020  Chief Complaint  Patient presents with  . Coronary Artery Disease  . Bradycardia  . Follow-up    HPI  Thomas Rowland is a 65 y.o. 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  In March 2020 while he was under the care of Dr. Kennon Rounds he had an abnormal nuclear stress test and underwent elective angiography and was found to have obstructive CAD.  He has had coronary interventions to both the RCA (2 PCI) and the LCx (1 PCI).   Patient reestablish care with [facility] cardiovascular in June 2021 and has been followed up for management of his CAD.  At the last office visit he was noted to have bradycardia and we recommended decreasing his metoprolol succinate from 100 mg p.o. daily to 50 mg p.o. daily and to reevaluate in a month.  Despite being on a lower dose of Toprol-XL he continues to be bradycardic but asymptomatic.  However due to EKG changes at the last visit the shared decision was to proceed with an ischemic evaluation.  He underwent a Lexiscan in May 2022 which was reported to be high risk given the reduced LVEF, dilated LV cavity, and presence of both fixed and small size reversible perfusion defect.  He presents today for follow-up.  Patient denies any chest pain or shortness of breath at rest or with effort related activities.  He states that his overall  functional status remains stable.  He has not required the use of sublingual nitroglycerin tablets.  No hospitalizations or urgent care visits for cardiovascular symptoms since last office encounter.  FUNCTIONAL STATUS: No structured exercise program or daily routine. 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    ALLERGIES: Allergies  Allergen Reactions  . Lisinopril Cough    MEDICATION LIST PRIOR TO VISIT: Current Meds  Medication Sig  . allopurinol (ZYLOPRIM) 100 MG tablet Take 1.5 tablets (150 mg total) by mouth daily.  Marland Kitchen amLODipine (NORVASC) 10 MG tablet Take 1 tablet (10 mg total) by mouth daily.  Marland Kitchen aspirin 81 MG EC tablet Take 1 tablet by mouth daily.  Marland Kitchen atorvastatin (LIPITOR) 40 MG tablet Take 1 tablet (40 mg total) by mouth daily.  . Cholecalciferol (VITAMIN D3) 25 MCG (1000 UT) CAPS Take 1,000 Units by mouth daily.  . clopidogrel (PLAVIX) 75 MG tablet Take 1 tablet (75 mg total) by mouth daily.  Marland Kitchen loratadine (CLARITIN) 10 MG tablet Take 10 mg by mouth daily.  Marland Kitchen losartan (COZAAR) 25 MG tablet Take 1 tablet (25 mg total) by mouth in the morning.  . magnesium oxide (MAG-OX) 400 MG tablet Take 400 mg by mouth 2 (two) times daily.  . metFORMIN (GLUCOPHAGE) 500 MG tablet Take 1 tablet (500 mg total) by mouth daily with breakfast.  . metoprolol succinate (TOPROL-XL) 25 MG 24 hr tablet Take 1 tablet (25 mg total) by mouth daily. Hold if top blood pressure number less than 100 mmHg or pulse less than 60 bpm.  . MITIGARE 0.6 MG  CAPS Take 1 capsule by mouth 3 (three) times daily as needed.  . multivitamin-iron-minerals-folic acid (CENTRUM) chewable tablet Chew 1 tablet by mouth daily.  . nitroGLYCERIN (NITROSTAT) 0.4 MG SL tablet Place 1 tablet (0.4 mg total) under the tongue every 5 (five) minutes as needed for chest pain. If you require more than two tablets five minutes apart go to the nearest ER via EMS.  Marland Kitchen potassium chloride SA (KLOR-CON) 20 MEQ tablet Take 1 tablet (20 mEq total) by mouth  daily.     PAST MEDICAL HISTORY: Past Medical History:  Diagnosis Date  . Coronary artery disease   . Diabetes mellitus without complication (HCC)   . History of COVID-19   . Hyperlipidemia   . Hypertension   . Nonspecific abnormal electrocardiogram (ECG) (EKG) 06/26/2013    PAST SURGICAL HISTORY: Past Surgical History:  Procedure Laterality Date  . COLONOSCOPY  2014   Normal  . CORONARY ANGIOPLASTY WITH STENT PLACEMENT    . LEG SURGERY Left 1997   [place]    FAMILY HISTORY: No family history of premature coronary disease or sudden cardiac death.  SOCIAL HISTORY:  The patient  reports that he quit smoking about 19 years ago. His smoking use included cigarettes and e-cigarettes. He started smoking about 52 years ago. He has a 7.50 pack-year smoking history. He has never used smokeless tobacco. He reports current alcohol use. He reports that he does not use drugs.  REVIEW OF SYSTEMS: Review of Systems  Constitutional: Negative for chills and fever.  HENT: Negative for hoarse voice and nosebleeds.   Eyes: Negative for discharge, double vision and pain.  Cardiovascular: Negative for chest pain, claudication, dyspnea on exertion, leg swelling, near-syncope, orthopnea, palpitations, paroxysmal nocturnal dyspnea and syncope.  Respiratory: Negative for hemoptysis and shortness of breath.   Musculoskeletal: Negative for muscle cramps and myalgias.  Gastrointestinal: Negative for abdominal pain, constipation, diarrhea, hematemesis, hematochezia, melena, nausea and vomiting.  Neurological: Negative for dizziness and light-headedness.    PHYSICAL EXAM: Vitals with BMI 09/30/2020 08/20/2020 08/13/2020  Height 5\' 5"  5\' 5"  5' 5.2"  Weight 190 lbs 13 oz 186 lbs 3 oz 189 lbs 13 oz  BMI 31.75 30.99 31.39  Systolic 147 138  Diastolic 84 81 88  Pulse 70 57 78   CONSTITUTIONAL: Well-developed and well-nourished. No acute distress.  SKIN: Skin is warm and dry. No rash noted. No cyanosis.  No pallor. No jaundice HEAD: Normocephalic and atraumatic.  EYES: No scleral icterus MOUTH/THROAT: Moist oral membranes.  NECK: No JVD present. No thyromegaly noted. No carotid bruits  LYMPHATIC: No visible cervical adenopathy.  CHEST Normal respiratory effort. No intercostal retractions  LUNGS: Clear to auscultation bilaterally.  No stridor. No wheezes. No rales.  CARDIOVASCULAR: Regular, bradycardia, positive S1-S2, no murmurs rubs or gallops appreciated.  ABDOMINAL: Soft, nontender, nondistended, positive bowel sounds all 4 quadrants. No apparent ascites.  EXTREMITIES: No peripheral edema  HEMATOLOGIC: No significant bruising NEUROLOGIC: Oriented to person, place, and time. Nonfocal. Normal muscle tone.  PSYCHIATRIC: Normal mood and affect. Normal behavior. Cooperative  CARDIAC DATABASE: EKG: 08/20/2020: Sinus bradycardia, 49 bpm, diffuse T wave abnormality anterolateral and inferolateral, without underlying injury pattern.    Echocardiogram: 10/20/2019: LVEF 50-55%, moderate LVH, indeterminate diastolic filling pattern, mildly dilated left atrium, mild AR, mild MR, mild TR, mild PR.  Stress Testing: 06/15/2019 from care everywhere (per report):  1. Potential small area of pharmacologically induced ischemia involving the mid aspect of the inferior wall of the left ventricle.  2. No scintigraphic evidence of prior infarction.  3. Mild hypokinesia involving the inferior wall of the left ventricle.  4. Ejection fraction - 50%.  Lexiscan Sestamibi stress test 09/11/2020: Small size, severe intensity, fixed perfusion defect consistent with prior infarct in the RCA distribution. Small size, mild intensity, reversible perfusion defect consistent with either reversible ischemia or peri-infarct ischemia in the LCx / RCA distribution. Severely reduced LVEF, calculated LVEF 24%.  Wall motion illustrates Global hypokinesia with hypokinetic inferior segments.  High risk study (severely reduced  LVEF, dilated LV, presence of both fixed and small size perfusion defect). Clinical correlation required.  Compared to prior outside records from 06/15/2019: Noted potential small area of pharmacologically induced ischemia involving the mid aspect of the inferior wall of the left ventricle. LVEF 50%, mild hypokinesis involving the inferior wall.   Heart Catheterization: [facility] "Angiography revealed mild LAD, 80% mLCx, 95% mRCA. Stented RCA with 3.5x18 and 3.5x12 Onyx DES with good angiographic result. Stented LCX with a 3.5x15 Onyx DES with good angiographic result."  LABORATORY DATA: CBC Latest Ref Rng & Units 08/13/2020 08/08/2010  WBC 3.4 - 10.8 x10E3/uL 9.0 10.5  Hemoglobin 13.0 - 17.7 g/dL 16.114.6 09.613.4  Hematocrit 04.537.5 - 51.0 % 44.7 38.7(L)  Platelets 150 - 450 x10E3/uL 160 163    CMP Latest Ref Rng & Units 08/13/2020 05/09/2020 02/14/2020  Glucose 65 - 99 mg/dL 409(W117(H) 99 119(J105(H)  BUN 8 - 27 mg/dL 16 18 22   Creatinine 0.76 - 1.27 mg/dL 4.781.16 2.951.20 6.21(H1.49(H)  Sodium 134 - 144 mmol/L 138 142 140  Potassium 3.5 - 5.2 mmol/L 4.4 4.7 4.8  Chloride 96 - 106 mmol/L 100 104 104  CO2 20 - 29 mmol/L 23 24 25   Calcium 8.6 - 10.2 mg/dL 9.3 8.9 9.3  Total Protein 6.0 - 8.5 g/dL - 6.9 -  Total Bilirubin 0.0 - 1.2 mg/dL - 0.3 -  Alkaline Phos 44 - 121 IU/L - 68 -  AST 0 - 40 IU/L - 22 -  ALT 0 - 44 IU/L - 22 -    Lipid Panel     Component Value Date/Time   CHOL 180 05/09/2020 1648   TRIG 67 05/09/2020 1648   HDL 69 05/09/2020 1648   CHOLHDL 2.6 05/09/2020 1648   LDLCALC 98 05/09/2020 1648   LABVLDL 13 05/09/2020 1648    No components found for: NTPROBNP No results for input(s): PROBNP in the last 8760 hours. Recent Labs    12/13/19 1615  TSH 1.590    HEMOGLOBIN A1C Lab Results  Component Value Date   HGBA1C 5.7 (H) 08/13/2020    IMPRESSION:    ICD-10-CM   1. Abnormal nuclear stress test  R94.39   2. Atherosclerosis of native  coronary artery of native heart without angina pectoris  I25.10 nitroGLYCERIN (NITROSTAT) 0.4 MG SL tablet    Basic metabolic panel    Hemoglobin and hematocrit, blood  3. History of coronary angioplasty with insertion of stent  Z95.5   4. Essential hypertension  I10   5. Mixed hyperlipidemia  E78.2   6. Non-insulin dependent type 2 diabetes mellitus (HCC)  E11.9   7. Hx of hyperaldosteronism  Z86.39   8. History of COVID-19  Z86.16   9. Bradycardia  R00.1      RECOMMENDATIONS: Margaretha GlassingGaniyu A Rowland is a 65 y.o. 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  Atherosclerosis of the native coronary arteries with prior PCI's without angina pectoris:  Chest pain-free.  Given his continued bradycardia despite down titration of beta-blockers and EKG noting T wave inversions in the high lateral and inferolateral leads at the last office visit the shared decision was to proceed with stress test.  Stress test results were discussed with both the patient and his family friend Fuller Plan at his request as she would help with translation as the patient is primary language is [place].  He is currently asymptomatic and does not want to proceed with left heart catheterization at this time.    I informed him that the reversible defect is small in size however given the other high risk factors additional work-up could be considered.  However, patient would like to treat his CAD medically for now until unless he develops symptoms.  This is a very reasonable approach at the current time we will reevaluate him in 1 month.    Continue aspirin and statin therapy, continue Plavix, ARB, Toprol-XL, and sublingual nitroglycerin tablets as needed.    May consider addition of  Ranexa if needed.    Will check blood work prior to next office visit.    Bradycardia:  Resolved.    Continue Toprol-XL 25 mg p.o. daily   Monitor for now.    History of coronary artery stents: Stable  Benign essential hypertension:  Medications reconciled.  Educated on importance of a low-salt diet.  Given his underlying chronic kidney disease recommended to avoid nephrotoxic agents such as NSAIDs.  Patient is asked to keep a log of his blood pressures and to bring it in at the next office visit.  Mixed hyperlipidemia: Continue statin therapy.  He does not endorse myalgias.  Most recent lipid profile reviewed.    Former smoker: Educated on the importance of continued smoking cessation.  Non-insulin-dependent diabetes: Currently managed by primary care provider.  Patient educated on importance of glycemic control given his underlying CAD.  FINAL MEDICATION LIST END OF ENCOUNTER: Meds ordered this encounter  Medications  . nitroGLYCERIN (NITROSTAT) 0.4 MG SL tablet    Sig: Place 1 tablet (0.4 mg total) under the tongue every 5 (five) minutes as needed for chest pain. If you require more than two tablets five minutes apart go to the nearest ER via EMS.    Dispense:  30 tablet    Refill:  0     Current Outpatient Medications:  .  allopurinol (ZYLOPRIM) 100 MG tablet, Take 1.5 tablets (150 mg total) by mouth daily., Disp: 135 tablet, Rfl: 1 .  amLODipine (NORVASC) 10 MG tablet, Take 1 tablet (10 mg total) by mouth daily., Disp: 90 tablet, Rfl: 2 .  aspirin 81 MG EC tablet, Take 1 tablet by mouth daily., Disp: , Rfl:  .  atorvastatin (LIPITOR) 40 MG tablet, Take 1 tablet (40 mg total) by mouth daily., Disp: 90 tablet, Rfl: 2 .  Cholecalciferol (VITAMIN D3) 25 MCG (1000 UT) CAPS, Take 1,000 Units by mouth daily., Disp: , Rfl:  .  clopidogrel (PLAVIX) 75 MG tablet, Take 1 tablet (75 mg total) by mouth daily., Disp: 90 tablet, Rfl: 3 .  loratadine (CLARITIN) 10 MG tablet, Take  10 mg by mouth daily., Disp: , Rfl:  .  losartan (COZAAR) 25 MG tablet, Take 1 tablet (25 mg total) by mouth in the morning., Disp: 90 tablet, Rfl: 3 .  magnesium oxide (  MAG-OX) 400 MG tablet, Take 400 mg by mouth 2 (two) times daily., Disp: , Rfl:  .  metFORMIN (GLUCOPHAGE) 500 MG tablet, Take 1 tablet (500 mg total) by mouth daily with breakfast., Disp: 90 tablet, Rfl: 2 .  metoprolol succinate (TOPROL-XL) 25 MG 24 hr tablet, Take 1 tablet (25 mg total) by mouth daily. Hold if top blood pressure number less than 100 mmHg or pulse less than 60 bpm., Disp: 30 tablet, Rfl: 0 .  MITIGARE 0.6 MG CAPS, Take 1 capsule by mouth 3 (three) times daily as needed., Disp: 30 capsule, Rfl: 2 .  multivitamin-iron-minerals-folic acid (CENTRUM) chewable tablet, Chew 1 tablet by mouth daily., Disp: , Rfl:  .  nitroGLYCERIN (NITROSTAT) 0.4 MG SL tablet, Place 1 tablet (0.4 mg total) under the tongue every 5 (five) minutes as needed for chest pain. If you require more than two tablets five minutes apart go to the nearest ER via EMS., Disp: 30 tablet, Rfl: 0 .  potassium chloride SA (KLOR-CON) 20 MEQ tablet, Take 1 tablet (20 mEq total) by mouth daily., Disp: 90 tablet, Rfl: 1  Orders Placed This Encounter  Procedures  . Basic metabolic panel  . Hemoglobin and hematocrit, blood   There are no Patient Instructions on file for this visit.   --Continue cardiac medications as reconciled in final medication list. --Return in about 4 weeks (around 10/28/2020) for Reevaluation of chest pain, stress test, EKG on arrival.. Or sooner if needed. --Continue follow-up with your primary care physician regarding the management of your other chronic comorbid conditions.  Patient's questions and concerns were addressed to his satisfaction. He voices understanding of the instructions provided during this encounter.   This note was created using a voice recognition software as a result there may be grammatical errors inadvertently  enclosed that do not reflect the nature of this encounter. Every attempt is made to correct such errors.  This is a prolonged office visit encompassing at least 47 minutes as we discussed the nuclear stress test results with him and after that over the phone with Fuller Plan at his request.  Their questions and concerns were addressed to their satisfaction.  Tessa Lerner, [place], [facility]  Pager: [phone] Office: [phone]

## 2020-10-26 ENCOUNTER — Other Ambulatory Visit: Payer: Self-pay | Admitting: Cardiology

## 2020-10-26 DIAGNOSIS — I2510 Atherosclerotic heart disease of native coronary artery without angina pectoris: Secondary | ICD-10-CM

## 2020-10-29 DIAGNOSIS — I2510 Atherosclerotic heart disease of native coronary artery without angina pectoris: Secondary | ICD-10-CM | POA: Diagnosis not present

## 2020-10-30 LAB — HEMOGLOBIN AND HEMATOCRIT, BLOOD
Hematocrit: 41.2 % (ref 37.5–51.0)
Hemoglobin: 13.4 g/dL (ref 13.0–17.7)

## 2020-10-30 LAB — BASIC METABOLIC PANEL
BUN/Creatinine Ratio: 15 (ref 10–24)
BUN: 19 mg/dL (ref 8–27)
CO2: 27 mmol/L (ref 20–29)
Calcium: 9.5 mg/dL (ref 8.6–10.2)
Chloride: 101 mmol/L (ref 96–106)
Creatinine, Ser: 1.25 mg/dL (ref 0.76–1.27)
Glucose: 94 mg/dL (ref 65–99)
Potassium: 4.5 mmol/L (ref 3.5–5.2)
Sodium: 141 mmol/L (ref 134–144)
eGFR: 64 mL/min/{1.73_m2} (ref >59)

## 2020-11-04 ENCOUNTER — Encounter: Payer: Self-pay | Admitting: Cardiology

## 2020-11-04 ENCOUNTER — Other Ambulatory Visit: Payer: Self-pay

## 2020-11-04 ENCOUNTER — Ambulatory Visit: Payer: PPO | Admitting: Cardiology

## 2020-11-04 VITALS — BP 138/86 | HR 74 | Temp 98.4°F | Resp 16 | Ht 65.0 in | Wt 194.0 lb

## 2020-11-04 DIAGNOSIS — E119 Type 2 diabetes mellitus without complications: Secondary | ICD-10-CM | POA: Diagnosis not present

## 2020-11-04 DIAGNOSIS — R001 Bradycardia, unspecified: Secondary | ICD-10-CM | POA: Diagnosis not present

## 2020-11-04 DIAGNOSIS — Z955 Presence of coronary angioplasty implant and graft: Secondary | ICD-10-CM | POA: Diagnosis not present

## 2020-11-04 DIAGNOSIS — I10 Essential (primary) hypertension: Secondary | ICD-10-CM | POA: Diagnosis not present

## 2020-11-04 DIAGNOSIS — Z8639 Personal history of other endocrine, nutritional and metabolic disease: Secondary | ICD-10-CM | POA: Diagnosis not present

## 2020-11-04 DIAGNOSIS — Z8616 Personal history of COVID-19: Secondary | ICD-10-CM

## 2020-11-04 DIAGNOSIS — Z712 Person consulting for explanation of examination or test findings: Secondary | ICD-10-CM

## 2020-11-04 DIAGNOSIS — E782 Mixed hyperlipidemia: Secondary | ICD-10-CM

## 2020-11-04 DIAGNOSIS — Z87891 Personal history of nicotine dependence: Secondary | ICD-10-CM | POA: Diagnosis not present

## 2020-11-04 DIAGNOSIS — R9439 Abnormal result of other cardiovascular function study: Secondary | ICD-10-CM | POA: Diagnosis not present

## 2020-11-04 DIAGNOSIS — I2510 Atherosclerotic heart disease of native coronary artery without angina pectoris: Secondary | ICD-10-CM | POA: Diagnosis not present

## 2020-11-04 NOTE — Progress Notes (Signed)
ID:  Thomas Rowland, Thomas Rowland 02/12/1956, MRN [id number]  PCP:  Dorothyann Peng, MD  Cardiologist: Tessa Lerner, DO, FACC(established care 10/13/2019) Former Cardiology Providers: Dr. Yates Decamp and Dr. Jordan Hawks.   Date: 11/04/20 Last Office Visit: 09/30/2020  Chief Complaint  Patient presents with   Results   Follow-up    Reevaluate symptoms after abnormal stress test    HPI  Thomas Rowland is a 65 y.o. 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  In March 2020 while he was under the care of Dr. Kennon Rounds he had an abnormal nuclear stress test and underwent elective angiography and was found to have obstructive CAD.  He has had coronary interventions to both the RCA (2 PCI) and the LCx (1 PCI).   Patient was managed medically for his underlying CAD and due to bradycardia his beta-blocker therapy was being down titrated.  In the interim, his EKG noted sinus bradycardia with ST-T changes to suggest possible ischemia and therefore underwent nuclear stress testing since its been greater than 2 years since his coronary intervention and EKG findings.  Nuclear stress test performed in May 2022 was reported to be a high risk scan due to reduced LVEF per gated SPECT, dilated LV cavity, and presence of both fixed and small size reversible defect.  At last office visit this was discussed with him and his family member Fuller Plan and the shared decision was to treat him medically as he is clinically asymptomatic.  He now presents for 1 month follow-up.  Patient states that in the last 1 month he  is relatively at his baseline.  He does not experience chest pain or shortness of breath at rest or with effort related activities.  His overall functional status remains stable.  FUNCTIONAL STATUS: No structured exercise program or daily routine. 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    ALLERGIES: Allergies  Allergen Reactions   Lisinopril Cough    MEDICATION LIST PRIOR TO VISIT: Current Meds  Medication Sig   allopurinol (ZYLOPRIM) 100 MG tablet Take 1.5 tablets (150 mg total) by mouth daily.   amLODipine (NORVASC) 10 MG tablet Take 1 tablet (10 mg total) by mouth daily.   aspirin 81 MG EC tablet Take 1 tablet by mouth daily.   atorvastatin (LIPITOR) 40 MG tablet Take 1 tablet (40 mg total) by mouth daily.   Cholecalciferol (VITAMIN D3) 25 MCG (1000 UT) CAPS Take 1,000 Units by mouth daily.   metFORMIN (GLUCOPHAGE) 500 MG tablet Take 500 mg by mouth daily at 12 noon.   metoprolol succinate (TOPROL-XL) 25 MG 24 hr tablet Take 1 tablet (25 mg total) by mouth daily. Hold if top blood pressure number less than 100 mmHg or pulse less than 60 bpm.   MITIGARE 0.6 MG CAPS Take 1 capsule by mouth 3 (three) times daily as needed.   multivitamin-iron-minerals-folic acid (CENTRUM) chewable tablet Chew 1 tablet by mouth daily.   nitroGLYCERIN (NITROSTAT) 0.4 MG SL tablet Place 1 tablet (0.4 mg total) under the tongue every 5 (five) minutes as needed for chest pain. If you require more than two tablets five minutes apart go to the nearest ER via EMS.  potassium chloride SA (KLOR-CON) 20 MEQ tablet Take 1 tablet (20 mEq total) by mouth daily.   [DISCONTINUED] metFORMIN (GLUCOPHAGE) 500 MG tablet Take 1 tablet (500 mg total) by mouth daily with breakfast.     PAST MEDICAL HISTORY: Past Medical History:  Diagnosis Date   Coronary artery disease    Diabetes mellitus without complication (HCC)    History of COVID-19    Hyperlipidemia    Hypertension    Nonspecific abnormal electrocardiogram (ECG)  (EKG) 06/26/2013    PAST SURGICAL HISTORY: Past Surgical History:  Procedure Laterality Date   COLONOSCOPY  2014   Normal   CORONARY ANGIOPLASTY WITH STENT PLACEMENT     LEG SURGERY Left 1997   [place]    FAMILY HISTORY: No family history of premature coronary disease or sudden cardiac death.  SOCIAL HISTORY:  The patient  reports that he quit smoking about 19 years ago. His smoking use included cigarettes and e-cigarettes. He started smoking about 52 years ago. He has a 7.50 pack-year smoking history. He has never used smokeless tobacco. He reports current alcohol use. He reports that he does not use drugs.  REVIEW OF SYSTEMS: Review of Systems  Constitutional: Negative for chills and fever.  HENT:  Negative for hoarse voice and nosebleeds.   Eyes:  Negative for discharge, double vision and pain.  Cardiovascular:  Negative for chest pain, claudication, dyspnea on exertion, leg swelling, near-syncope, orthopnea, palpitations, paroxysmal nocturnal dyspnea and syncope.  Respiratory:  Negative for hemoptysis and shortness of breath.   Musculoskeletal:  Negative for muscle cramps and myalgias.  Gastrointestinal:  Negative for abdominal pain, constipation, diarrhea, hematemesis, hematochezia, melena, nausea and vomiting.  Neurological:  Negative for dizziness and light-headedness.   PHYSICAL EXAM: Vitals with BMI 11/04/2020 11/04/2020 09/30/2020  Height - 5\' 5"  5\' 5"   Weight - 194 lbs 190 lbs 13 oz  BMI - 32.28 31.75  Systolic 138 158  Diastolic 86 93 84  Pulse 74 83 70   CONSTITUTIONAL: Well-developed and well-nourished. No acute distress.  SKIN: Skin is warm and dry. No rash noted. No cyanosis. No pallor. No jaundice HEAD: Normocephalic and atraumatic.  EYES: No scleral icterus MOUTH/THROAT: Moist oral membranes.  NECK: No JVD present. No thyromegaly noted. No carotid bruits  LYMPHATIC: No visible cervical adenopathy.  CHEST Normal respiratory effort. No intercostal  retractions  LUNGS: Clear to auscultation bilaterally.  No stridor. No wheezes. No rales.  CARDIOVASCULAR: Regular, bradycardia, positive S1-S2, no murmurs rubs or gallops appreciated.  ABDOMINAL: Soft, nontender, nondistended, positive bowel sounds all 4 quadrants. No apparent ascites.  EXTREMITIES: No peripheral edema  HEMATOLOGIC: No significant bruising NEUROLOGIC: Oriented to person, place, and time. Nonfocal. Normal muscle tone.  PSYCHIATRIC: Normal mood and affect. Normal behavior. Cooperative  CARDIAC DATABASE: EKG: 11/04/2020: Normal sinus rhythm, 68 bpm, T wave inversions in the inferior and lateral leads suggestive of possible ischemia without underlying injury pattern.  No significant change compared to prior EKG.  Echocardiogram: 10/20/2019: LVEF 50-55%, moderate LVH, indeterminate diastolic filling pattern, mildly dilated left atrium, mild AR, mild MR, mild TR, mild PR.  Stress Testing: 06/15/2019 from care everywhere (per report):  1. Potential small area of pharmacologically induced ischemia involving the mid aspect of the inferior wall of the left ventricle.  2. No scintigraphic evidence of prior infarction.  3. Mild hypokinesia involving the inferior wall of the left ventricle.  4. Ejection fraction - 50%.  Lexiscan Sestamibi stress test 09/11/2020: Small size, severe intensity, fixed perfusion defect  consistent with prior infarct in the RCA distribution. Small size, mild intensity, reversible perfusion defect consistent with either reversible ischemia or peri-infarct ischemia in the LCx / RCA distribution. Severely reduced LVEF, calculated LVEF 24%.  Wall motion illustrates Global hypokinesia with hypokinetic inferior segments.  High risk study (severely reduced LVEF, dilated LV, presence of both fixed and small size perfusion defect). Clinical correlation required.  Compared to prior outside records from 06/15/2019: Noted potential small area of pharmacologically induced  ischemia involving the mid aspect of the inferior wall of the left ventricle. LVEF 50%, mild hypokinesis involving the inferior wall.   Heart Catheterization: 07/15/2018 LHC with Dr. Theron Arista at [facility] (care everywhere): "Angiography revealed mild LAD, 80% mLCx, 95% mRCA. Stented RCA with 3.5x18 and 3.5x12 Onyx DES with good angiographic result. Stented LCX with a 3.5x15 Onyx DES with good angiographic result."  LABORATORY DATA: CBC Latest Ref Rng & Units 10/29/2020 08/13/2020 08/08/2010  WBC 3.4 - 10.8 x10E3/uL - 9.0 10.5  Hemoglobin 13.0 - 17.7 g/dL 45.4 09.8 11.9  Hematocrit 37.5 - 51.0 % 41.2 44.7 38.7(L)  Platelets 150 - 450 x10E3/uL - 160 163    CMP Latest Ref Rng & Units 10/29/2020 08/13/2020 05/09/2020  Glucose 65 - 99 mg/dL 94 147(W) 99  BUN 8 - 27 mg/dL Creatinine 0.76 - 1.27 mg/dL 2.95 6.21 3.08  Sodium 134 - 144 mmol/L 141 138 142  Potassium 3.5 - 5.2 mmol/L 4.5 4.4 4.7  Chloride 96 - 106 mmol/L 101 100 104  CO2 20 - 29 mmol/L Calcium 8.6 - 10.2 mg/dL 9.5 9.3 8.9  Total Protein 6.0 - 8.5 g/dL - - 6.9  Total Bilirubin 0.0 - 1.2 mg/dL - - 0.3  Alkaline Phos 44 - 121 IU/L - - 68  AST 0 - 40 IU/L - - 22  ALT 0 - 44 IU/L - - 22    Lipid Panel     Component Value Date/Time   CHOL 180 05/09/2020 1648   TRIG 67 05/09/2020 1648   HDL 69 05/09/2020 1648   CHOLHDL 2.6 05/09/2020 1648   LDLCALC 98 05/09/2020 1648   LABVLDL 13 05/09/2020 1648    No components found for: NTPROBNP No results for input(s): PROBNP in the last 8760 hours. Recent Labs    12/13/19 1615  TSH 1.590    HEMOGLOBIN A1C Lab Results  Component Value Date   HGBA1C 5.7 (H) 08/13/2020    IMPRESSION:    ICD-10-CM   1. Abnormal nuclear stress test  R94.39 EKG 12-Lead    2. Atherosclerosis of native coronary artery of native heart without angina pectoris  I25.10     3. History of coronary angioplasty with insertion of stent  Z95.5     4. Essential hypertension  I10     5. Mixed  hyperlipidemia  E78.2     6. Non-insulin dependent type 2 diabetes mellitus (HCC)  E11.9     7. Hx of hyperaldosteronism  Z86.39     8. History of COVID-19  Z86.16     9. Bradycardia  R00.1     10. Former smoker  Z87.891     11. Encounter to discuss test results  Z71.2        RECOMMENDATIONS: Thomas Rowland is a 65 y.o. 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  Atherosclerosis of the native coronary arteries with prior PCI's without angina pectoris: Chest pain-free. As discussed above given his underlying bradycardia and EKG changes he underwent a nuclear stress test in May 2022 which was reported to be high risk due to reduced LVEF per gated SPECT, dilated LV cavity, and fixed as well as small reversible perfusion defect. Clinically he is asymptomatic and does not have any angina pectoris or anginal equivalent symptoms. Patient would like to continue with medical therapy at this time unless a change in clinical status.  I reassured him that this is very appropriate. Medications reconciled. EKG performed and interpreted.  Findings noted above. No use of sublingual nitroglycerin tablets. Most recent blood work from 10/29/2020 independently reviewed and noted above for further reference. Will follow-up in 3 months to reevaluate symptoms or sooner if change in clinical status. I offered to speak to his family friend Alfred LevinsFunk who accompanies the patient on multiple office visit.  The patient states that she is out of town and therefore no need to speak to her at today's encounter.  Bradycardia: Resolved.   Continue Toprol-XL 25 mg p.o. daily  Monitor for now.    History of coronary artery stents: Stable  Benign essential  hypertension: Medications reconciled. Educated on importance of a low-salt diet. Given his underlying chronic kidney disease recommended to avoid nephrotoxic agents such as NSAIDs. Patient is asked to keep a log of his blood pressures and to bring it in at the next office visit.  Mixed hyperlipidemia: Continue statin therapy.  He does not endorse myalgias.  Most recent lipid profile reviewed.    Former smoker: Educated on the importance of continued smoking cessation.  Non-insulin-dependent diabetes: Currently managed by primary care provider.  Patient educated on importance of glycemic control given his underlying CAD.  FINAL MEDICATION LIST END OF ENCOUNTER: No orders of the defined types were placed in this encounter.    Current Outpatient Medications:    allopurinol (ZYLOPRIM) 100 MG tablet, Take 1.5 tablets (150 mg total) by mouth daily., Disp: 135 tablet, Rfl: 1   amLODipine (NORVASC) 10 MG tablet, Take 1 tablet (10 mg total) by mouth daily., Disp: 90 tablet, Rfl: 2   aspirin 81 MG EC tablet, Take 1 tablet by mouth daily., Disp: , Rfl:    atorvastatin (LIPITOR) 40 MG tablet, Take 1 tablet (40 mg total) by mouth daily., Disp: 90 tablet, Rfl: 2   Cholecalciferol (VITAMIN D3) 25 MCG (1000 UT) CAPS, Take 1,000 Units by mouth daily., Disp: , Rfl:    metFORMIN (GLUCOPHAGE) 500 MG tablet, Take 500 mg by mouth daily at 12 noon., Disp: , Rfl:    metoprolol succinate (TOPROL-XL) 25 MG 24 hr tablet, Take 1 tablet (25 mg total) by mouth daily. Hold if top blood pressure number less than 100 mmHg or pulse less than 60 bpm., Disp: 30 tablet, Rfl: 0   MITIGARE 0.6 MG CAPS, Take 1 capsule by mouth 3 (three) times daily as needed., Disp: 30 capsule, Rfl: 2   multivitamin-iron-minerals-folic acid (CENTRUM) chewable tablet, Chew 1 tablet by mouth daily., Disp: , Rfl:    nitroGLYCERIN (NITROSTAT) 0.4 MG SL tablet, Place 1 tablet (0.4 mg total) under the tongue every 5 (five) minutes as needed for chest  pain. If you require more than two tablets five minutes apart go to the nearest ER via EMS., Disp: 30 tablet, Rfl: 0   potassium chloride SA (KLOR-CON) 20 MEQ tablet, Take 1 tablet (20 mEq  total) by mouth daily., Disp: 90 tablet, Rfl: 1   clopidogrel (PLAVIX) 75 MG tablet, Take 1 tablet (75 mg total) by mouth daily., Disp: 90 tablet, Rfl: 3   loratadine (CLARITIN) 10 MG tablet, Take 10 mg by mouth daily., Disp: , Rfl:    losartan (COZAAR) 25 MG tablet, Take 1 tablet (25 mg total) by mouth in the morning., Disp: 90 tablet, Rfl: 3   magnesium oxide (MAG-OX) 400 MG tablet, Take 400 mg by mouth 2 (two) times daily., Disp: , Rfl:   Orders Placed This Encounter  Procedures   EKG 12-Lead   There are no Patient Instructions on file for this visit.   --Continue cardiac medications as reconciled in final medication list. --Return in about 3 months (around 02/04/2021) for Follow up reevaluation after an abnormal stress test. Or sooner if needed. --Continue follow-up with your primary care physician regarding the management of your other chronic comorbid conditions.  Patient's questions and concerns were addressed to his satisfaction. He voices understanding of the instructions provided during this encounter.   This note was created using a voice recognition software as a result there may be grammatical errors inadvertently enclosed that do not reflect the nature of this encounter. Every attempt is made to correct such errors.  Total time spent: 33 minutes had a discussion with the patient with regards to reevaluation of his symptoms, reviewed the imaging studies for reference, independently reviewed labs from 10/29/2020, complex decision making, language barrier also contributed to a prolonged office visit as he fluently speaks [place] and [place] is his second language.  Tessa Lerner, [place], [facility]  Pager: [phone] Office: [phone]

## 2020-11-12 ENCOUNTER — Other Ambulatory Visit: Payer: Self-pay | Admitting: Internal Medicine

## 2020-11-27 ENCOUNTER — Other Ambulatory Visit: Payer: Self-pay | Admitting: Cardiology

## 2020-11-27 DIAGNOSIS — I2510 Atherosclerotic heart disease of native coronary artery without angina pectoris: Secondary | ICD-10-CM

## 2020-12-04 DIAGNOSIS — E1165 Type 2 diabetes mellitus with hyperglycemia: Secondary | ICD-10-CM | POA: Diagnosis not present

## 2020-12-18 ENCOUNTER — Other Ambulatory Visit: Payer: Self-pay

## 2020-12-18 ENCOUNTER — Encounter: Payer: Self-pay | Admitting: Internal Medicine

## 2020-12-18 ENCOUNTER — Ambulatory Visit (INDEPENDENT_AMBULATORY_CARE_PROVIDER_SITE_OTHER): Payer: PPO | Admitting: Internal Medicine

## 2020-12-18 VITALS — BP 124/80 | HR 68 | Temp 98.4°F | Ht 65.0 in | Wt 190.6 lb

## 2020-12-18 DIAGNOSIS — Z6831 Body mass index (BMI) 31.0-31.9, adult: Secondary | ICD-10-CM | POA: Diagnosis not present

## 2020-12-18 DIAGNOSIS — I2510 Atherosclerotic heart disease of native coronary artery without angina pectoris: Secondary | ICD-10-CM | POA: Diagnosis not present

## 2020-12-18 DIAGNOSIS — E6609 Other obesity due to excess calories: Secondary | ICD-10-CM | POA: Diagnosis not present

## 2020-12-18 DIAGNOSIS — I119 Hypertensive heart disease without heart failure: Secondary | ICD-10-CM | POA: Diagnosis not present

## 2020-12-18 DIAGNOSIS — E1169 Type 2 diabetes mellitus with other specified complication: Secondary | ICD-10-CM

## 2020-12-18 MED ORDER — ATORVASTATIN CALCIUM 40 MG PO TABS: 40.0000 mg | ORAL_TABLET | Freq: Every day | ORAL | 2 refills | Status: DC

## 2020-12-18 MED ORDER — AMLODIPINE BESYLATE 10 MG PO TABS: 10.0000 mg | ORAL_TABLET | Freq: Every day | ORAL | 2 refills | Status: DC

## 2020-12-18 NOTE — Patient Instructions (Signed)

## 2020-12-18 NOTE — Progress Notes (Signed)
I,[name],acting as a Neurosurgeon for Gwynneth Aliment, MD.,have documented all relevant documentation on the behalf of Gwynneth Aliment, MD,as directed by  Gwynneth Aliment, MD while in the presence of Gwynneth Aliment, MD.  This visit occurred during the SARS-CoV-2 public health emergency.  Safety protocols were in place, including screening questions prior to the visit, additional usage of staff PPE, and extensive cleaning of exam room while observing appropriate contact time as indicated for disinfecting solutions.  Subjective:     Patient ID: Thomas Rowland , male    DOB: 1955/12/06 , 65 y.o.   MRN: [id number]   Chief Complaint  Patient presents with   Diabetes   Hypertension    HPI  Pt presents for dm/htn check. He states that he is compliant with medication.  He has been taking metformin w/o any issues.  He has no other concerns today. He does not need any refills today.    Diabetes He presents for his follow-up diabetic visit. He has type 2 diabetes mellitus. There are no hypoglycemic associated symptoms. Pertinent negatives for hypoglycemia include no dizziness or headaches. Pertinent negatives for diabetes include no blurred vision, no chest pain, no fatigue, no polydipsia, no polyphagia and no polyuria. Symptoms are stable.  Hypertension This is a chronic problem. The current episode started more than 1 year ago. The problem has been gradually worsening since onset. The problem is uncontrolled. Pertinent negatives include no anxiety, blurred vision, chest pain, headaches or palpitations. There are no associated agents to hypertension. Risk factors for coronary artery disease include obesity and diabetes mellitus. Past treatments include diuretics. The current treatment provides no improvement. There are no compliance problems.  There is no history of angina.    Past Medical History:  Diagnosis Date   Coronary artery disease    Diabetes mellitus without complication (HCC)     History of COVID-19    Hyperlipidemia    Hypertension    Nonspecific abnormal electrocardiogram (ECG) (EKG) 06/26/2013     Family History  Family history unknown: Yes     Current Outpatient Medications:    allopurinol (ZYLOPRIM) 100 MG tablet, Take 1.5 tablets (150 mg total) by mouth daily., Disp: 135 tablet, Rfl: 1   aspirin 81 MG EC tablet, Take 1 tablet by mouth daily., Disp: , Rfl:    Cholecalciferol (VITAMIN D3) 25 MCG (1000 UT) CAPS, Take 1,000 Units by mouth daily., Disp: , Rfl:    clopidogrel (PLAVIX) 75 MG tablet, Take 1 tablet (75 mg total) by mouth daily., Disp: 90 tablet, Rfl: 3   loratadine (CLARITIN) 10 MG tablet, Take 10 mg by mouth daily., Disp: , Rfl:    losartan (COZAAR) 25 MG tablet, Take 1 tablet (25 mg total) by mouth in the morning., Disp: 90 tablet, Rfl: 3   magnesium oxide (MAG-OX) 400 MG tablet, Take 400 mg by mouth 2 (two) times daily., Disp: , Rfl:    metFORMIN (GLUCOPHAGE) 500 MG tablet, Take 500 mg by mouth daily at 12 noon., Disp: , Rfl:    metoprolol succinate (TOPROL-XL) 25 MG 24 hr tablet, Take 1 tablet (25 mg total) by mouth daily. Hold if top blood pressure number less than 100 mmHg or pulse less than 60 bpm., Disp: 30 tablet, Rfl: 0   MITIGARE 0.6 MG CAPS, Take 1 capsule by mouth 3 (three) times daily as needed., Disp: 30 capsule, Rfl: 2   multivitamin-iron-minerals-folic acid (CENTRUM) chewable tablet, Chew 1 tablet by mouth daily., Disp: , Rfl:  nitroGLYCERIN (NITROSTAT) 0.4 MG SL tablet, Place 1 tablet (0.4 mg total) under the tongue every 5 (five) minutes as needed for chest pain. If you require more than two tablets five minutes apart go to the nearest ER via EMS., Disp: 30 tablet, Rfl: 0   potassium chloride SA (KLOR-CON) 20 MEQ tablet, Take 1 tablet (20 mEq total) by mouth daily., Disp: 90 tablet, Rfl: 1   amLODipine (NORVASC) 10 MG tablet, Take 1 tablet (10 mg total) by mouth daily., Disp: 90 tablet, Rfl: 2   atorvastatin (LIPITOR) 40 MG tablet,  Take 1 tablet (40 mg total) by mouth daily., Disp: 90 tablet, Rfl: 2   Allergies  Allergen Reactions   Lisinopril Cough     Review of Systems  Constitutional: Negative.  Negative for fatigue.  Eyes:  Negative for blurred vision.  Respiratory: Negative.    Cardiovascular: Negative.  Negative for chest pain and palpitations.  Gastrointestinal: Negative.   Endocrine: Negative for polydipsia, polyphagia and polyuria.  Neurological: Negative.  Negative for dizziness and headaches.    Today's Vitals   12/18/20 1622  BP: 124/80  Pulse: 68  Temp: 98.4 F (36.9 C)  TempSrc: Oral  Weight: 190 lb 9.6 oz (86.5 kg)  Height: 5\' 5"  (1.651 m)   Body mass index is 31.72 kg/m.  Wt Readings from Last 3 Encounters:  12/18/20 190 lb 9.6 oz (86.5 kg)  11/04/20 194 lb (88 kg)  09/30/20 190 lb 12.8 oz (86.5 kg)    Objective:  Physical Exam Vitals and nursing note reviewed.  Constitutional:      Appearance: Normal appearance.  HENT:     Nose:     Comments: Masked     Mouth/Throat:     Comments: Masked  Eyes:     Extraocular Movements: Extraocular movements intact.  Cardiovascular:     Rate and Rhythm: Normal rate and regular rhythm.     Heart sounds: Normal heart sounds.  Pulmonary:     Effort: Pulmonary effort is normal.     Breath sounds: Normal breath sounds.  Musculoskeletal:     Cervical back: Normal range of motion.  Skin:    General: Skin is warm.  Neurological:     General: No focal deficit present.     Mental Status: He is alert.  Psychiatric:        Mood and Affect: Mood normal.        Assessment And Plan:     1. Type 2 diabetes mellitus with other specified complication, without long-term current use of insulin (HCC) Comments: Chronic, stable on metformin. I will check labs as listed below. Importance of dietary, medication compliance was d/w patient.  - Hemoglobin A1c  2. Hypertensive heart disease without heart failure Comments: Chronic, well controlled.  Encouraged to follow low sodium diet.   3. Atherosclerosis of native coronary artery of native heart without angina pectoris Comments: Chronic, also followed by Cardiology. I appreciate their input. He is encouraged to follow herat healthy diet.   4. Class 1 obesity due to excess calories with serious comorbidity and body mass index (BMI) of 31.0 to 31.9 in adult Comments: Chronic, he is encouraged to strive for BMI less than 29 to decrease cardiac risk. Advised to aim for at least 150 minutes of exercise per week.   Patient was given opportunity to ask questions. Patient verbalized understanding of the plan and was able to repeat key elements of the plan. All questions were answered to their satisfaction.  I, Maximino Greenland, MD, have reviewed all documentation for this visit. The documentation on 12/22/20 for the exam, diagnosis, procedures, and orders are all accurate and complete.   IF YOU HAVE BEEN REFERRED TO A SPECIALIST, IT MAY TAKE 1-2 WEEKS TO SCHEDULE/PROCESS THE REFERRAL. IF YOU HAVE NOT HEARD FROM US/SPECIALIST IN TWO WEEKS, PLEASE GIVE [place] A CALL AT [phone].   THE PATIENT IS ENCOURAGED TO PRACTICE SOCIAL DISTANCING DUE TO THE COVID-19 PANDEMIC.

## 2020-12-19 LAB — HEMOGLOBIN A1C
Est. average glucose Bld gHb Est-mCnc: 126 mg/dL
Hgb A1c MFr Bld: 6 % — ABNORMAL HIGH (ref 4.8–5.6)

## 2020-12-20 ENCOUNTER — Telehealth: Payer: Self-pay

## 2020-12-20 NOTE — Telephone Encounter (Signed)
-----   Message from Dorothyann Peng, MD sent at 12/19/2020  7:29 PM EDT ----- Your hba1c is 6.0, please continue with current meds for now.

## 2020-12-20 NOTE — Telephone Encounter (Signed)
Left a detailed message at the pt's request. 

## 2020-12-25 ENCOUNTER — Other Ambulatory Visit: Payer: Self-pay | Admitting: Cardiology

## 2020-12-25 DIAGNOSIS — I2510 Atherosclerotic heart disease of native coronary artery without angina pectoris: Secondary | ICD-10-CM

## 2021-01-24 ENCOUNTER — Other Ambulatory Visit: Payer: Self-pay | Admitting: Cardiology

## 2021-01-24 DIAGNOSIS — I2510 Atherosclerotic heart disease of native coronary artery without angina pectoris: Secondary | ICD-10-CM

## 2021-01-29 DIAGNOSIS — H11042 Peripheral pterygium, stationary, left eye: Secondary | ICD-10-CM | POA: Diagnosis not present

## 2021-01-29 DIAGNOSIS — H40023 Open angle with borderline findings, high risk, bilateral: Secondary | ICD-10-CM | POA: Diagnosis not present

## 2021-02-04 ENCOUNTER — Encounter: Payer: Self-pay | Admitting: Cardiology

## 2021-02-04 ENCOUNTER — Other Ambulatory Visit: Payer: Self-pay

## 2021-02-04 ENCOUNTER — Ambulatory Visit: Payer: PPO | Admitting: Cardiology

## 2021-02-04 VITALS — BP 143/87 | HR 74 | Resp 16 | Ht 65.0 in | Wt 189.0 lb

## 2021-02-04 DIAGNOSIS — Z955 Presence of coronary angioplasty implant and graft: Secondary | ICD-10-CM | POA: Diagnosis not present

## 2021-02-04 DIAGNOSIS — Z8639 Personal history of other endocrine, nutritional and metabolic disease: Secondary | ICD-10-CM | POA: Diagnosis not present

## 2021-02-04 DIAGNOSIS — E119 Type 2 diabetes mellitus without complications: Secondary | ICD-10-CM

## 2021-02-04 DIAGNOSIS — Z87891 Personal history of nicotine dependence: Secondary | ICD-10-CM | POA: Diagnosis not present

## 2021-02-04 DIAGNOSIS — I10 Essential (primary) hypertension: Secondary | ICD-10-CM

## 2021-02-04 DIAGNOSIS — E782 Mixed hyperlipidemia: Secondary | ICD-10-CM

## 2021-02-04 DIAGNOSIS — R9439 Abnormal result of other cardiovascular function study: Secondary | ICD-10-CM

## 2021-02-04 DIAGNOSIS — Z8616 Personal history of COVID-19: Secondary | ICD-10-CM

## 2021-02-04 DIAGNOSIS — I2510 Atherosclerotic heart disease of native coronary artery without angina pectoris: Secondary | ICD-10-CM

## 2021-02-04 NOTE — Progress Notes (Signed)
ID:  Thomas, [name], Row[place], MRN [id number]  PCP:  Dorothyann Peng, MD  Cardiologist: Tessa Lerner, DO, FACC(established care 10/13/2019) Former Cardiology Providers: Dr. Yates Decamp and Dr. Jordan Hawks.   Date: 02/04/21 Last Office Visit: 11/04/2020  Chief Complaint  Patient presents with   Abnormal nuclear stress test   Follow-up    HPI  Thomas Rowland is a 65 y.o. 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  In March 2020 while he was under the care of Dr. Kennon Rounds he had an abnormal nuclear stress test and underwent elective angiography and was found to have obstructive CAD.  He has had coronary interventions to both the RCA (2 PCI) and the LCx (1 PCI).   Patient was managed medically for his underlying CAD and due to bradycardia his beta-blocker therapy was being down titrated.  In the interim, his EKG noted sinus bradycardia with ST-T changes to suggest possible ischemia and therefore underwent nuclear stress testing since its been greater than 2 years since his coronary intervention and EKG findings.  Nuclear stress test performed in May 2022 was reported to be a high risk scan due to reduced LVEF per gated SPECT, dilated LV cavity, and presence of both fixed and small size reversible defect.  Approximately 3 months ago when he followed up in the office that shared decision was to treat him medically.  He is here for follow-up visit.  For last 3 months patient states that he is doing well from a cardiovascular standpoint.  He has not had any chest pain, shortness of breath at rest or  with effort related activities.  He is overall euvolemic and not in congestive heart failure.  No change in overall functional status.  No use of sublingual nitroglycerin tablets.  Patient would like to continue medical therapy.  FUNCTIONAL STATUS: No structured exercise program or daily routine. 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    ALLERGIES: Allergies  Allergen Reactions   Lisinopril Cough    MEDICATION LIST PRIOR TO VISIT: Current Meds  Medication Sig   amLODipine (NORVASC) 10 MG tablet Take 1 tablet (10 mg total) by mouth daily.   aspirin 81 MG EC tablet Take 1 tablet by mouth daily.   atorvastatin (LIPITOR) 40 MG tablet Take 1 tablet (40 mg total) by mouth daily.   Cholecalciferol (VITAMIN D3) 25 MCG (1000 UT) CAPS Take 1,000 Units by mouth daily.   clopidogrel (PLAVIX) 75 MG tablet Take 1 tablet (75 mg total) by mouth daily.   loratadine (CLARITIN) 10 MG tablet Take 10 mg by mouth daily.   losartan (COZAAR) 25 MG tablet Take 1 tablet (25 mg total) by mouth in the morning.   magnesium oxide (MAG-OX) 400 MG tablet Take 400 mg by mouth 2 (two) times daily.   metFORMIN (GLUCOPHAGE) 500 MG tablet Take 500 mg by mouth daily at 12 noon.   metoprolol succinate (TOPROL-XL) 25 MG 24 hr tablet Take 1 tablet (25 mg total) by mouth daily. Hold if top blood pressure number less than 100 mmHg or pulse less than 60 bpm.   MITIGARE 0.6 MG CAPS Take 1 capsule by mouth 3 (three) times daily as needed.  multivitamin-iron-minerals-folic acid (CENTRUM) chewable tablet Chew 1 tablet by mouth daily.   nitroGLYCERIN (NITROSTAT) 0.4 MG SL tablet Place 1 tablet (0.4 mg total) under the tongue every 5 (five) minutes as needed for chest pain. If you require more than two tablets five minutes apart go to the nearest ER via EMS.   potassium chloride SA (KLOR-CON) 20 MEQ tablet Take 1 tablet (20 mEq total) by mouth daily.     PAST MEDICAL HISTORY: Past Medical History:  Diagnosis Date   Coronary artery  disease    Diabetes mellitus without complication (HCC)    History of COVID-19    Hyperlipidemia    Hypertension    Nonspecific abnormal electrocardiogram (ECG) (EKG) 06/26/2013    PAST SURGICAL HISTORY: Past Surgical History:  Procedure Laterality Date   COLONOSCOPY  2014   Normal   CORONARY ANGIOPLASTY WITH STENT PLACEMENT     LEG SURGERY Left 1997   [place]    FAMILY HISTORY: No family history of premature coronary disease or sudden cardiac death.  SOCIAL HISTORY:  The patient  reports that he quit smoking about 19 years ago. His smoking use included cigarettes and e-cigarettes. He started smoking about 52 years ago. He has a 7.50 pack-year smoking history. He has never used smokeless tobacco. He reports current alcohol use. He reports that he does not use drugs.  REVIEW OF SYSTEMS: Review of Systems  Constitutional: Negative for chills and fever.  HENT:  Negative for hoarse voice and nosebleeds.   Eyes:  Negative for discharge, double vision and pain.  Cardiovascular:  Negative for chest pain, claudication, dyspnea on exertion, leg swelling, near-syncope, orthopnea, palpitations, paroxysmal nocturnal dyspnea and syncope.  Respiratory:  Negative for hemoptysis and shortness of breath.   Musculoskeletal:  Negative for muscle cramps and myalgias.  Gastrointestinal:  Negative for abdominal pain, constipation, diarrhea, hematemesis, hematochezia, melena, nausea and vomiting.  Neurological:  Negative for dizziness and light-headedness.   PHYSICAL EXAM: Vitals with BMI 02/04/2021 02/04/2021 12/18/2020  Height - 5\' 5"  5\' 5"   Weight - 189 lbs 190 lbs 10 oz  BMI - 31.45 31.72  Systolic 143 158  Diastolic 87 88 80  Pulse 74 68 68   CONSTITUTIONAL: Well-developed and well-nourished. No acute distress.  SKIN: Skin is warm and dry. No rash noted. No cyanosis. No pallor. No jaundice HEAD: Normocephalic and atraumatic.  EYES: No scleral icterus MOUTH/THROAT: Moist oral membranes.   NECK: No JVD present. No thyromegaly noted. No carotid bruits  LYMPHATIC: No visible cervical adenopathy.  CHEST Normal respiratory effort. No intercostal retractions  LUNGS: Clear to auscultation bilaterally.  No stridor. No wheezes. No rales.  CARDIOVASCULAR: Regular, bradycardia, positive S1-S2, no murmurs rubs or gallops appreciated.  ABDOMINAL: Soft, nontender, nondistended, positive bowel sounds all 4 quadrants. No apparent ascites.  EXTREMITIES: No peripheral edema  HEMATOLOGIC: No significant bruising NEUROLOGIC: Oriented to person, place, and time. Nonfocal. Normal muscle tone.  PSYCHIATRIC: Normal mood and affect. Normal behavior. Cooperative  CARDIAC DATABASE: EKG: 02/04/2021: Normal sinus rhythm, 60 bpm, normal axis, T wave inversions in the high lateral and lateral leads suggestive of possible ischemia, without underlying injury pattern.  No significant change compared to prior ECG.  Echocardiogram: 10/20/2019: LVEF 50-55%, moderate LVH, indeterminate diastolic filling pattern, mildly dilated left atrium, mild AR, mild MR, mild TR, mild PR.  Stress Testing: 06/15/2019 from care everywhere (per report):  1. Potential small area of pharmacologically induced ischemia involving the mid aspect of the inferior wall of the left  ventricle.  2. No scintigraphic evidence of prior infarction.  3. Mild hypokinesia involving the inferior wall of the left ventricle.  4. Ejection fraction - 50%.  Lexiscan Sestamibi stress test 09/11/2020: Small size, severe intensity, fixed perfusion defect consistent with prior infarct in the RCA distribution. Small size, mild intensity, reversible perfusion defect consistent with either reversible ischemia or peri-infarct ischemia in the LCx / RCA distribution. Severely reduced LVEF, calculated LVEF 24%.  Wall motion illustrates Global hypokinesia with hypokinetic inferior segments.  High risk study (severely reduced LVEF, dilated LV, presence of both  fixed and small size perfusion defect). Clinical correlation required.  Compared to prior outside records from 06/15/2019: Noted potential small area of pharmacologically induced ischemia involving the mid aspect of the inferior wall of the left ventricle. LVEF 50%, mild hypokinesis involving the inferior wall.   Heart Catheterization: 07/15/2018 LHC with Dr. Theron Arista at [facility] (care everywhere): "Angiography revealed mild LAD, 80% mLCx, 95% mRCA. Stented RCA with 3.5x18 and 3.5x12 Onyx DES with good angiographic result. Stented LCX with a 3.5x15 Onyx DES with good angiographic result."  LABORATORY DATA: CBC Latest Ref Rng & Units 10/29/2020 08/13/2020 08/08/2010  WBC 3.4 - 10.8 x10E3/uL - 9.0 10.5  Hemoglobin 13.0 - 17.7 g/dL 11.9 14.7 82.9  Hematocrit 37.5 - 51.0 % 41.2 44.7 38.7(L)  Platelets 150 - 450 x10E3/uL - 160 163    CMP Latest Ref Rng & Units 10/29/2020 08/13/2020 05/09/2020  Glucose 65 - 99 mg/dL 94 562(Z) 99  BUN 8 - 27 mg/dL Creatinine 0.76 - 1.27 mg/dL 3.08 6.57 8.46  Sodium 134 - 144 mmol/L 141 138 142  Potassium 3.5 - 5.2 mmol/L 4.5 4.4 4.7  Chloride 96 - 106 mmol/L 101 100 104  CO2 20 - 29 mmol/L Calcium 8.6 - 10.2 mg/dL 9.5 9.3 8.9  Total Protein 6.0 - 8.5 g/dL - - 6.9  Total Bilirubin 0.0 - 1.2 mg/dL - - 0.3  Alkaline Phos 44 - 121 IU/L - - 68  AST 0 - 40 IU/L - - 22  ALT 0 - 44 IU/L - - 22    Lipid Panel     Component Value Date/Time   CHOL 180 05/09/2020 1648   TRIG 67 05/09/2020 1648   HDL 69 05/09/2020 1648   CHOLHDL 2.6 05/09/2020 1648   LDLCALC 98 05/09/2020 1648   LABVLDL 13 05/09/2020 1648    No components found for: NTPROBNP No results for input(s): PROBNP in the last 8760 hours. No results for input(s): TSH in the last 8760 hours.   HEMOGLOBIN A1C Lab Results  Component Value Date   HGBA1C 6.0 (H) 12/18/2020    IMPRESSION:    ICD-10-CM   1. Atherosclerosis of native coronary artery of native heart without angina pectoris   I25.10 EKG 12-Lead    2. History of coronary angioplasty with insertion of stent  Z95.5     3. Abnormal nuclear stress test  R94.39     4. Essential hypertension  I10     5. Mixed hyperlipidemia  E78.2     6. Non-insulin dependent type 2 diabetes mellitus (HCC)  E11.9     7. Hx of hyperaldosteronism  Z86.39     8. Former smoker  Z87.891     9. History of COVID-19  Z86.16        RECOMMENDATIONS: Broghan A Rowland is a 65 y.o. 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  Atherosclerosis of the native coronary arteries with prior PCI's without angina pectoris: Chest pain-free. EKG no significant change compared to prior findings. No use of sublingual nitroglycerin tablets. Nuclear stress test back in May 2022 reported to be high risk due to reduced LVEF per gated SPECT, dilated LV cavity, and fixed perfusion defect with small reversible perfusion defect suggestive of possible peri-infarct ischemia. Shared decision was to uptitrate guideline directed medical therapy/antianginal therapy. Presents for 51-month follow-up visit and is relatively stable with no ischemic symptoms.  He would like to continue current therapy. He is educated that if he experiences chest pain, anginal equivalents compared to his prior events, decreased functional endurance he should seek medical attention by going to the closest ED via EMS for further evaluation and management.  He verbalizes understanding. Despite the results of the stress test back in May 2022 patient has remained relatively stable over the last 5 months.  I will see him back in 6 months or sooner if change in clinical status.  Benign essential hypertension: Office blood pressures are within  acceptable range; however, home blood pressures are better controlled. Medications reconciled. Educated on importance of a low-salt diet. Given his underlying chronic kidney disease recommended to avoid nephrotoxic agents such as NSAIDs.  Mixed hyperlipidemia:  Currently on atorvastatin.   He denies myalgia or other side effects. Most recent lipids dated 04/2020 reviewed as noted above. Currently managed by primary care provider.  Non-insulin-dependent diabetes:  Patient educated on importance of glycemic control given his underlying CAD. Currently on ARB, statin therapy. May consider SGLT2 inhibitors-will defer to primary team.  FINAL MEDICATION LIST END OF ENCOUNTER: No orders of the defined types were placed in this encounter.    Current Outpatient Medications:    amLODipine (NORVASC) 10 MG tablet, Take 1 tablet (10 mg total) by mouth daily., Disp: 90 tablet, Rfl: 2   aspirin 81 MG EC tablet, Take 1 tablet by mouth daily., Disp: , Rfl:    atorvastatin (LIPITOR) 40 MG tablet, Take 1 tablet (40 mg total) by mouth daily., Disp: 90 tablet, Rfl: 2   Cholecalciferol (VITAMIN D3) 25 MCG (1000 UT) CAPS, Take 1,000 Units by mouth daily., Disp: , Rfl:    clopidogrel (PLAVIX) 75 MG tablet, Take 1 tablet (75 mg total) by mouth daily., Disp: 90 tablet, Rfl: 3   loratadine (CLARITIN) 10 MG tablet, Take 10 mg by mouth daily., Disp: , Rfl:    losartan (COZAAR) 25 MG tablet, Take 1 tablet (25 mg total) by mouth in the morning., Disp: 90 tablet, Rfl: 3   magnesium oxide (MAG-OX) 400 MG tablet, Take 400 mg by mouth 2 (two) times daily., Disp: , Rfl:    metFORMIN (GLUCOPHAGE) 500 MG tablet, Take 500 mg by mouth daily at 12 noon., Disp: , Rfl:    metoprolol succinate (TOPROL-XL) 25 MG 24 hr tablet, Take 1 tablet (25 mg total) by mouth daily. Hold if top blood pressure number less than 100 mmHg or pulse less than 60 bpm., Disp: 90 tablet, Rfl: 0   MITIGARE 0.6 MG CAPS, Take 1 capsule by mouth 3 (three)  times daily as needed., Disp: 30 capsule, Rfl: 2   multivitamin-iron-minerals-folic acid (CENTRUM) chewable tablet, Chew 1 tablet by mouth daily., Disp: , Rfl:    nitroGLYCERIN (NITROSTAT) 0.4 MG SL tablet, Place 1 tablet (0.4 mg total) under the  tongue every 5 (five) minutes as needed for chest pain. If you require more than two tablets five minutes apart go to the nearest ER via EMS., Disp: 30 tablet, Rfl: 0   potassium chloride SA (KLOR-CON) 20 MEQ tablet, Take 1 tablet (20 mEq total) by mouth daily., Disp: 90 tablet, Rfl: 1  Orders Placed This Encounter  Procedures   EKG 12-Lead   There are no Patient Instructions on file for this visit.   --Continue cardiac medications as reconciled in final medication list. --Return in 6 months (on 08/05/2021) for Follow up, CAD. Or sooner if needed. --Continue follow-up with your primary care physician regarding the management of your other chronic comorbid conditions.  Patient's questions and concerns were addressed to his satisfaction. He voices understanding of the instructions provided during this encounter.   This note was created using a voice recognition software as a result there may be grammatical errors inadvertently enclosed that do not reflect the nature of this encounter. Every attempt is made to correct such errors.  Tessa Lerner, [place], [facility]  Pager: [phone] Office: [phone]

## 2021-03-25 ENCOUNTER — Encounter: Payer: Self-pay | Admitting: Internal Medicine

## 2021-04-02 ENCOUNTER — Encounter: Payer: Self-pay | Admitting: Internal Medicine

## 2021-04-02 ENCOUNTER — Ambulatory Visit (INDEPENDENT_AMBULATORY_CARE_PROVIDER_SITE_OTHER): Payer: PPO | Admitting: Internal Medicine

## 2021-04-02 ENCOUNTER — Other Ambulatory Visit: Payer: Self-pay

## 2021-04-02 VITALS — BP 132/88 | HR 69 | Temp 98.8°F | Ht 65.0 in | Wt 181.4 lb

## 2021-04-02 DIAGNOSIS — I208 Other forms of angina pectoris: Secondary | ICD-10-CM

## 2021-04-02 DIAGNOSIS — E1169 Type 2 diabetes mellitus with other specified complication: Secondary | ICD-10-CM | POA: Diagnosis not present

## 2021-04-02 DIAGNOSIS — Z23 Encounter for immunization: Secondary | ICD-10-CM | POA: Diagnosis not present

## 2021-04-02 DIAGNOSIS — E261 Secondary hyperaldosteronism: Secondary | ICD-10-CM | POA: Diagnosis not present

## 2021-04-02 DIAGNOSIS — I119 Hypertensive heart disease without heart failure: Secondary | ICD-10-CM | POA: Diagnosis not present

## 2021-04-02 NOTE — Progress Notes (Signed)
I,Katawbba Wiggins,acting as a Education administrator for Maximino Greenland, MD.,have documented all relevant documentation on the behalf of Maximino Greenland, MD,as directed by  Maximino Greenland, MD while in the presence of Maximino Greenland, MD.  This visit occurred during the SARS-CoV-2 public health emergency.  Safety protocols were in place, including screening questions prior to the visit, additional usage of staff PPE, and extensive cleaning of exam room while observing appropriate contact time as indicated for disinfecting solutions.  Subjective:     Patient ID: Thomas Rowland , male    DOB: [birth year] , 65 y.o.   MRN: [id number]   Chief Complaint  Patient presents with   Diabetes   Hypertension    HPI  Pt presents for dm/htn check. He states that he is compliant with medication.  He denies headaches, chest pain and shortness of breath.    Diabetes He presents for his follow-up diabetic visit. He has type 2 diabetes mellitus. There are no hypoglycemic associated symptoms. Pertinent negatives for hypoglycemia include no dizziness or headaches. Pertinent negatives for diabetes include no blurred vision, no chest pain, no fatigue, no polydipsia, no polyphagia and no polyuria. Symptoms are stable.  Hypertension This is a chronic problem. The current episode started more than 1 year ago. The problem has been gradually worsening since onset. The problem is uncontrolled. Pertinent negatives include no anxiety, blurred vision, chest pain, headaches or palpitations. There are no associated agents to hypertension. Risk factors for coronary artery disease include obesity and diabetes mellitus. Past treatments include diuretics. The current treatment provides no improvement. There are no compliance problems.  There is no history of angina.    Past Medical History:  Diagnosis Date   Coronary artery disease    Diabetes mellitus without complication ([place])    History of COVID-19    Hyperlipidemia    Hypertension     Nonspecific abnormal electrocardiogram (ECG) (EKG) 06/26/2013     Family History  Family history unknown: Yes     Current Outpatient Medications:    amLODipine (NORVASC) 10 MG tablet, Take 1 tablet (10 mg total) by mouth daily., Disp: 90 tablet, Rfl: 2   aspirin 81 MG EC tablet, Take 1 tablet by mouth daily., Disp: , Rfl:    atorvastatin (LIPITOR) 40 MG tablet, Take 1 tablet (40 mg total) by mouth daily., Disp: 90 tablet, Rfl: 2   Cholecalciferol (VITAMIN D3) 25 MCG (1000 UT) CAPS, Take 1,000 Units by mouth daily., Disp: , Rfl:    clopidogrel (PLAVIX) 75 MG tablet, Take 1 tablet (75 mg total) by mouth daily., Disp: 90 tablet, Rfl: 3   loratadine (CLARITIN) 10 MG tablet, Take 10 mg by mouth daily., Disp: , Rfl:    losartan (COZAAR) 25 MG tablet, Take 1 tablet (25 mg total) by mouth in the morning., Disp: 90 tablet, Rfl: 3   magnesium oxide (MAG-OX) 400 MG tablet, Take 400 mg by mouth 2 (two) times daily., Disp: , Rfl:    metFORMIN (GLUCOPHAGE) 500 MG tablet, Take 500 mg by mouth daily at 12 noon., Disp: , Rfl:    metoprolol succinate (TOPROL-XL) 25 MG 24 hr tablet, Take 1 tablet (25 mg total) by mouth daily. Hold if top blood pressure number less than 100 mmHg or pulse less than 60 bpm., Disp: 90 tablet, Rfl: 0   MITIGARE 0.6 MG CAPS, Take 1 capsule by mouth 3 (three) times daily as needed., Disp: 30 capsule, Rfl: 2   multivitamin-iron-minerals-folic acid (CENTRUM) chewable tablet, Chew  1 tablet by mouth daily., Disp: , Rfl:    nitroGLYCERIN (NITROSTAT) 0.4 MG SL tablet, Place 1 tablet (0.4 mg total) under the tongue every 5 (five) minutes as needed for chest pain. If you require more than two tablets five minutes apart go to the nearest ER via EMS., Disp: 30 tablet, Rfl: 0   potassium chloride SA (KLOR-CON) 20 MEQ tablet, Take 1 tablet (20 mEq total) by mouth daily., Disp: 90 tablet, Rfl: 1   Allergies  Allergen Reactions   Lisinopril Cough     Review of Systems  Constitutional:  Negative.  Negative for fatigue.  Eyes:  Negative for blurred vision.  Respiratory: Negative.    Cardiovascular: Negative.  Negative for chest pain and palpitations.  Gastrointestinal: Negative.   Endocrine: Negative for polydipsia, polyphagia and polyuria.  Neurological:  Negative for dizziness and headaches.  Psychiatric/Behavioral: Negative.    All other systems reviewed and are negative.   Today's Vitals   04/02/21 1610  BP: 132/88  Pulse: 69  Temp: 98.8 F (37.1 C)  Weight: 181 lb 6.4 oz (82.3 kg)  Height: _0  (1.651 m)   Body mass index is 30.19 kg/m.  Wt Readings from Last 3 Encounters:  04/02/21 181 lb 6.4 oz (82.3 kg)  02/04/21 189 lb (85.7 kg)  12/18/20 190 lb 9.6 oz (86.5 kg)    BP Readings from Last 3 Encounters:  04/02/21 132/88  02/04/21 (!) 143/87  12/18/20 124/80    Objective:  Physical Exam Vitals and nursing note reviewed.  Constitutional:      Appearance: Normal appearance.  HENT:     Head: Normocephalic and atraumatic.     Nose:     Comments: Masked     Mouth/Throat:     Comments: Masked  Eyes:     Extraocular Movements: Extraocular movements intact.  Cardiovascular:     Rate and Rhythm: Normal rate and regular rhythm.     Heart sounds: Normal heart sounds.  Pulmonary:     Effort: Pulmonary effort is normal.     Breath sounds: Normal breath sounds.  Musculoskeletal:     Cervical back: Normal range of motion.  Skin:    General: Skin is warm.  Neurological:     General: No focal deficit present.     Mental Status: He is alert.  Psychiatric:        Mood and Affect: Mood normal.        Assessment And Plan:     1. Type 2 diabetes mellitus with other specified complication, without long-term current use of insulin (HCC) Comments: Chronic, currently on metformin. Encouraged to comply with diet/medications/exercise recommendations.  - Hemoglobin A1c  2. Hypertensive heart disease without heart failure Comments: Chronic, fair control.  Goal BP <130/80. Encouraged to follow low sodium diet. Decrease intake of processed meats incl. bacon, sausages and deli meats.  - CMP14+EGFR  3. Stable angina (HCC) Comments: Chronic, also followed by Cardiology. Encouraged to follow heart healthy lifestyle.   4. Apparent mineralocorticoid excess (HCC) Comments: Diagnosed as per Cardiology. Importane of salt restriction was discussed with the patient.   5. Need for immunization against influenza Comments: He was given high dose flu vaccine.  - Flu Vaccine QUAD High Dose(Fluad)   Patient was given opportunity to ask questions. Patient verbalized understanding of the plan and was able to repeat key elements of the plan. All questions were answered to their satisfaction.   I, Maximino Greenland, MD, have reviewed all documentation for this  visit. The documentation on 04/19/21 for the exam, diagnosis, procedures, and orders are all accurate and complete.   IF YOU HAVE BEEN REFERRED TO A SPECIALIST, IT MAY TAKE 1-2 WEEKS TO SCHEDULE/PROCESS THE REFERRAL. IF YOU HAVE NOT HEARD FROM US/SPECIALIST IN TWO WEEKS, PLEASE GIVE [place] A CALL AT [phone] X [phone].   THE PATIENT IS ENCOURAGED TO PRACTICE SOCIAL DISTANCING DUE TO THE COVID-19 PANDEMIC.

## 2021-04-02 NOTE — Patient Instructions (Signed)

## 2021-04-03 LAB — CMP14+EGFR
ALT: 25 IU/L (ref 0–44)
AST: 30 IU/L (ref 0–40)
Albumin/Globulin Ratio: 1.7 (ref 1.2–2.2)
Albumin: 4.5 g/dL (ref 3.8–4.8)
Alkaline Phosphatase: 80 IU/L (ref 44–121)
BUN/Creatinine Ratio: 13 (ref 10–24)
BUN: 16 mg/dL (ref 8–27)
Bilirubin Total: 0.3 mg/dL (ref 0.0–1.2)
CO2: 26 mmol/L (ref 20–29)
Calcium: 9.4 mg/dL (ref 8.6–10.2)
Chloride: 103 mmol/L (ref 96–106)
Creatinine, Ser: 1.2 mg/dL (ref 0.76–1.27)
Globulin, Total: 2.6 g/dL (ref 1.5–4.5)
Glucose: 87 mg/dL (ref 70–99)
Potassium: 4.6 mmol/L (ref 3.5–5.2)
Sodium: 144 mmol/L (ref 134–144)
Total Protein: 7.1 g/dL (ref 6.0–8.5)
eGFR: 67 mL/min/{1.73_m2} (ref >59)

## 2021-04-03 LAB — HEMOGLOBIN A1C
Est. average glucose Bld gHb Est-mCnc: 117 mg/dL
Hgb A1c MFr Bld: 5.7 % — ABNORMAL HIGH (ref 4.8–5.6)

## 2021-04-22 ENCOUNTER — Other Ambulatory Visit: Payer: Self-pay | Admitting: Cardiology

## 2021-04-22 DIAGNOSIS — I2510 Atherosclerotic heart disease of native coronary artery without angina pectoris: Secondary | ICD-10-CM

## 2021-04-23 ENCOUNTER — Other Ambulatory Visit: Payer: Self-pay | Admitting: Cardiology

## 2021-04-23 DIAGNOSIS — I2510 Atherosclerotic heart disease of native coronary artery without angina pectoris: Secondary | ICD-10-CM

## 2021-04-30 DIAGNOSIS — H401131 Primary open-angle glaucoma, bilateral, mild stage: Secondary | ICD-10-CM | POA: Diagnosis not present

## 2021-04-30 DIAGNOSIS — H2513 Age-related nuclear cataract, bilateral: Secondary | ICD-10-CM | POA: Diagnosis not present

## 2021-04-30 DIAGNOSIS — H11042 Peripheral pterygium, stationary, left eye: Secondary | ICD-10-CM | POA: Diagnosis not present

## 2021-05-02 ENCOUNTER — Other Ambulatory Visit: Payer: Self-pay | Admitting: Internal Medicine

## 2021-05-05 ENCOUNTER — Other Ambulatory Visit: Payer: Self-pay | Admitting: Internal Medicine

## 2021-06-03 DIAGNOSIS — H11042 Peripheral pterygium, stationary, left eye: Secondary | ICD-10-CM | POA: Diagnosis not present

## 2021-06-03 DIAGNOSIS — H2513 Age-related nuclear cataract, bilateral: Secondary | ICD-10-CM | POA: Diagnosis not present

## 2021-06-03 DIAGNOSIS — H401131 Primary open-angle glaucoma, bilateral, mild stage: Secondary | ICD-10-CM | POA: Diagnosis not present

## 2021-06-11 ENCOUNTER — Other Ambulatory Visit: Payer: Self-pay | Admitting: Internal Medicine

## 2021-06-30 ENCOUNTER — Other Ambulatory Visit: Payer: Self-pay | Admitting: Cardiology

## 2021-06-30 DIAGNOSIS — I10 Essential (primary) hypertension: Secondary | ICD-10-CM

## 2021-07-28 ENCOUNTER — Other Ambulatory Visit: Payer: Self-pay | Admitting: Internal Medicine

## 2021-07-30 ENCOUNTER — Other Ambulatory Visit: Payer: Self-pay | Admitting: Cardiology

## 2021-07-30 DIAGNOSIS — I2510 Atherosclerotic heart disease of native coronary artery without angina pectoris: Secondary | ICD-10-CM

## 2021-08-05 ENCOUNTER — Ambulatory Visit: Payer: PPO | Admitting: Cardiology

## 2021-08-14 ENCOUNTER — Encounter: Payer: 59 | Admitting: Internal Medicine

## 2021-08-18 ENCOUNTER — Encounter: Payer: Self-pay | Admitting: Cardiology

## 2021-08-18 ENCOUNTER — Ambulatory Visit: Payer: PPO | Admitting: Cardiology

## 2021-08-18 VITALS — BP 137/83 | HR 76 | Resp 16 | Ht 65.0 in | Wt 189.4 lb

## 2021-08-18 DIAGNOSIS — Z955 Presence of coronary angioplasty implant and graft: Secondary | ICD-10-CM

## 2021-08-18 DIAGNOSIS — E119 Type 2 diabetes mellitus without complications: Secondary | ICD-10-CM | POA: Diagnosis not present

## 2021-08-18 DIAGNOSIS — R9439 Abnormal result of other cardiovascular function study: Secondary | ICD-10-CM | POA: Diagnosis not present

## 2021-08-18 DIAGNOSIS — E782 Mixed hyperlipidemia: Secondary | ICD-10-CM | POA: Diagnosis not present

## 2021-08-18 DIAGNOSIS — I2510 Atherosclerotic heart disease of native coronary artery without angina pectoris: Secondary | ICD-10-CM | POA: Diagnosis not present

## 2021-08-18 DIAGNOSIS — I10 Essential (primary) hypertension: Secondary | ICD-10-CM

## 2021-08-18 DIAGNOSIS — Z87891 Personal history of nicotine dependence: Secondary | ICD-10-CM

## 2021-08-18 DIAGNOSIS — Z8639 Personal history of other endocrine, nutritional and metabolic disease: Secondary | ICD-10-CM | POA: Diagnosis not present

## 2021-08-18 NOTE — Progress Notes (Signed)
? ?ID:  Thomas Rowland, DOB 1956/02/05, MRN [id number] ? ?PCP:  Dorothyann PengSanders, Robyn, MD  ?Cardiologist: Tessa LernerSunit Usman Millett, DO, FACC(established care 10/13/2019) ?Former Cardiology Providers: Dr. Yates DecampJay Ganji and Dr. Jordan HawksLuke Allen Peters.  ? ?Date: 08/18/21 ?Last Office Visit: 02/04/2021 ? ?Chief Complaint  ?Patient presents with  ? Coronary Artery Disease  ? Follow-up  ? ? ?HPI  ?Thomas Rowland is a 66 y.o. 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 ? ?In March 2020 while he was seeing Dr. Kennon RoundsLuke Peters he had an abnormal nuclear stress test and underwent elective angiography and was found to have obstructive CAD.  He has had coronary interventions to both the RCA (2 PCI) and the LCx (1 PCI). ? ?He reestablish care with our practice in June 2021 and given his underlying bradycardia his AV nodal blocking agents were down titrated.  In addition, given EKG changes and his last ischemic evaluation being greater than 2 years after his PCI's he underwent stress test to evaluate for reversible ischemia.  Stress test in May 2022 was reported to be high risk due to reduced LVEF per gated SPECT, dilated LV cavity, and presence of both fixed and small size reversible perfusion defect.  We discussed in great length with regards to continuing medical management as he was asymptomatic versus undergoing heart catheterization for reevaluation of obstructive CAD.  Patient chose to undergo continued observation with up titration of medical therapy.  He now presents for 6066-month follow-up. ? ?Over the last 6 months patient denies angina pectoris or heart failure symptoms.  He is not cognizant of his dietary intake and his most recent hemoglobin A1c as of December 2022 was  5.7.  Patient has a yearly physical with his PCP in May 2023 and will have fasting lipid profile done at that time.  He has not required the use of sublingual nitroglycerin tablets.  And overall functional status remained stable. ? ?FUNCTIONAL STATUS: ?Patient states that his work is labor-intensive, no structured exercise program or daily routine. ? ?ALLERGIES: ?Allergies  ?Allergen Reactions  ? Lisinopril Cough  ? ? ?MEDICATION LIST PRIOR TO VISIT: ?Current Meds  ?Medication Sig  ? allopurinol (ZYLOPRIM) 100 MG tablet Take 100 mg by mouth daily as needed.  ? amLODipine (NORVASC) 10 MG tablet Take 1 tablet (10 mg total) by mouth daily.  ? aspirin 81 MG EC tablet Take 1 tablet by mouth daily.  ? atorvastatin (LIPITOR) 40 MG tablet Take 1 tablet (40 mg total) by mouth daily.  ? Cholecalciferol (VITAMIN D3) 25 MCG (1000 UT) CAPS Take 1,000 Units by mouth daily.  ? clopidogrel (PLAVIX) 75 MG tablet Take 1 tablet (75 mg total) by mouth daily.  ? loratadine (CLARITIN) 10 MG tablet Take 10 mg by mouth daily.  ? losartan (COZAAR) 25 MG tablet Take 1 tablet (25 mg total) by mouth in the morning.  ? MAGNESIUM OXIDE PO Take 500 mg by mouth 2 (two) times daily.  ? metFORMIN (GLUCOPHAGE) 500 MG tablet Take 1 tablet (500 mg total) by mouth daily with breakfast.  ? metoprolol succinate (TOPROL-XL) 25 MG 24 hr tablet TAKE ONE TABLET BY MOUTH EVERY DAY ** Hold if topically blood pressure number less THAN 100 mmhg OR pulse less THAN 60 beat PER minute**  ? MITIGARE 0.6 MG CAPS Take 1 capsule  by mouth 3 (three) times daily as needed.  ? Multiple Vitamin (ONE-DAILY MULTI VITAMINS PO) Take by mouth.  ? nitroGLYCERIN (NITROSTAT) 0.4 MG SL tablet Place 1 tablet (0.4 mg total) under the tongue every 5 (five) minutes as needed for chest pain. If you require more than two tablets five minutes apart go to the nearest ER via EMS.  ? potassium chloride SA (KLOR-CON M) 20 MEQ tablet Take 1 tablet (20 mEq total) by mouth daily.  ? sildenafil  (REVATIO) 20 MG tablet TAKE ONE TABLET BY MOUTH EVERY DAY AS NEEDED  ?  ? ?PAST MEDICAL HISTORY: ?Past Medical History:  ?Diagnosis Date  ? Coronary artery disease   ? Diabetes mellitus without complication (HCC)   ? History of COVID-19   ? Hyperlipidemia   ? Hypertension   ? Nonspecific abnormal electrocardiogram (ECG) (EKG) 06/26/2013  ? ? ?PAST SURGICAL HISTORY: ?Past Surgical History:  ?Procedure Laterality Date  ? COLONOSCOPY  2014  ? Normal  ? CORONARY ANGIOPLASTY WITH STENT PLACEMENT    ? LEG SURGERY Left 1997  ? [place]  ? ? ?FAMILY HISTORY: ?No family history of premature coronary disease or sudden cardiac death. ? ?SOCIAL HISTORY:  ?The patient  reports that he quit smoking about 20 years ago. His smoking use included cigarettes and e-cigarettes. He started smoking about 53 years ago. He has a 7.50 pack-year smoking history. He has never used smokeless tobacco. He reports current alcohol use. He reports that he does not use drugs. ? ?REVIEW OF SYSTEMS: ?Review of Systems  ?Constitutional: Negative for chills and fever.  ?HENT:  Negative for hoarse voice and nosebleeds.   ?Eyes:  Negative for discharge, double vision and pain.  ?Cardiovascular:  Negative for chest pain, claudication, dyspnea on exertion, leg swelling, near-syncope, orthopnea, palpitations, paroxysmal nocturnal dyspnea and syncope.  ?Respiratory:  Negative for hemoptysis and shortness of breath.   ?Musculoskeletal:  Negative for muscle cramps and myalgias.  ?Gastrointestinal:  Negative for abdominal pain, constipation, diarrhea, hematemesis, hematochezia, melena, nausea and vomiting.  ?Neurological:  Negative for dizziness and light-headedness.  ? ?PHYSICAL EXAM: ? ?  08/18/2021  ?  3:55 PM 04/02/2021  ?  4:10 PM 02/04/2021  ?  3:59 PM  ?Vitals with BMI  ?Height 5\' 5"  5\' 5"    ?Weight 189 lbs 6 oz 181 lbs 6 oz   ?BMI 31.52 30.19   ?Systolic 137 132  ?Diastolic 83 88 87  ?Pulse 76 69 74  ? ?CONSTITUTIONAL: Well-developed and well-nourished. No  acute distress.  ?SKIN: Skin is warm and dry. No rash noted. No cyanosis. No pallor. No jaundice ?HEAD: Normocephalic and atraumatic.  ?EYES: No scleral icterus ?MOUTH/THROAT: Moist oral membranes.  ?NECK: No JVD present. No thyromegaly noted. No carotid bruits  ?LYMPHATIC: No visible cervical adenopathy.  ?CHEST Normal respiratory effort. No intercostal retractions  ?LUNGS: Clear to auscultation bilaterally.  No stridor. No wheezes. No rales.  ?CARDIOVASCULAR: Regular, bradycardia, positive S1-S2, no murmurs rubs or gallops appreciated.  ?ABDOMINAL: Soft, nontender, nondistended, positive bowel sounds all 4 quadrants. No apparent ascites.  ?EXTREMITIES: No peripheral edema, dry skin, warm to touch, 2+ PT and DP pulses bilaterally. ?HEMATOLOGIC: No significant bruising ?NEUROLOGIC: Oriented to person, place, and time. Nonfocal. Normal muscle tone.  ?PSYCHIATRIC: Normal mood and affect. Normal behavior. Cooperative ? ?CARDIAC DATABASE: ?EKG: ?02/04/2021: Normal sinus rhythm, 60 bpm, normal axis, T wave inversions in the high lateral and lateral leads suggestive of possible ischemia, without underlying injury pattern.  No significant change  compared to prior ECG. ?August 18, 2021: Normal sinus rhythm, 61 bpm, T wave inversions in inferolateral leads. ? ?Echocardiogram: ?10/20/2019: LVEF 50-55%, moderate LVH, indeterminate diastolic filling pattern, mildly dilated left atrium, mild AR, mild MR, mild TR, mild PR. ? ?Stress Testing: ?06/15/2019 from care everywhere (per report):  ?1. Potential small area of pharmacologically induced ischemia involving the mid aspect of the inferior wall of the left ventricle.  ?2. No scintigraphic evidence of prior infarction.  ?3. Mild hypokinesia involving the inferior wall of the left ventricle.  ?4. Ejection fraction - 50%. ? ?Lexiscan Sestamibi stress test 09/11/2020: ?Small size, severe intensity, fixed perfusion defect consistent with prior infarct in the RCA distribution. ?Small  size, mild intensity, reversible perfusion defect consistent with either reversible ischemia or peri-infarct ischemia in the LCx / RCA distribution. ?Severely reduced LVEF, calculated LVEF 24%.  Wall motion i

## 2021-09-03 ENCOUNTER — Ambulatory Visit (INDEPENDENT_AMBULATORY_CARE_PROVIDER_SITE_OTHER): Payer: PPO | Admitting: Internal Medicine

## 2021-09-03 ENCOUNTER — Ambulatory Visit: Payer: PPO

## 2021-09-03 ENCOUNTER — Encounter: Payer: Self-pay | Admitting: Internal Medicine

## 2021-09-03 VITALS — BP 130/78 | HR 71 | Temp 99.0°F | Ht 65.0 in | Wt 185.0 lb

## 2021-09-03 DIAGNOSIS — Z23 Encounter for immunization: Secondary | ICD-10-CM | POA: Diagnosis not present

## 2021-09-03 DIAGNOSIS — Z6830 Body mass index (BMI) 30.0-30.9, adult: Secondary | ICD-10-CM

## 2021-09-03 DIAGNOSIS — I119 Hypertensive heart disease without heart failure: Secondary | ICD-10-CM

## 2021-09-03 DIAGNOSIS — Z0000 Encounter for general adult medical examination without abnormal findings: Secondary | ICD-10-CM

## 2021-09-03 DIAGNOSIS — R351 Nocturia: Secondary | ICD-10-CM | POA: Diagnosis not present

## 2021-09-03 DIAGNOSIS — Z1211 Encounter for screening for malignant neoplasm of colon: Secondary | ICD-10-CM | POA: Diagnosis not present

## 2021-09-03 DIAGNOSIS — L84 Corns and callosities: Secondary | ICD-10-CM | POA: Diagnosis not present

## 2021-09-03 DIAGNOSIS — E559 Vitamin D deficiency, unspecified: Secondary | ICD-10-CM

## 2021-09-03 DIAGNOSIS — E1169 Type 2 diabetes mellitus with other specified complication: Secondary | ICD-10-CM | POA: Diagnosis not present

## 2021-09-03 DIAGNOSIS — E6609 Other obesity due to excess calories: Secondary | ICD-10-CM | POA: Diagnosis not present

## 2021-09-03 DIAGNOSIS — E782 Mixed hyperlipidemia: Secondary | ICD-10-CM | POA: Diagnosis not present

## 2021-09-03 LAB — POCT URINALYSIS DIPSTICK
Bilirubin, UA: NEGATIVE
Glucose, UA: NEGATIVE
Ketones, UA: NEGATIVE
Leukocytes, UA: NEGATIVE
Nitrite, UA: NEGATIVE
Protein, UA: POSITIVE — AB
Spec Grav, UA: 1.02 (ref 1.010–1.025)
Urobilinogen, UA: 0.2 E.U./dL
pH, UA: 7.5 (ref 5.0–8.0)

## 2021-09-03 LAB — POC HEMOCCULT BLD/STL (OFFICE/1-CARD/DIAGNOSTIC)
Card #1 Date: 5102023
Fecal Occult Blood, POC: NEGATIVE

## 2021-09-03 MED ORDER — AMLODIPINE BESYLATE 10 MG PO TABS: 10.0000 mg | ORAL_TABLET | Freq: Every day | ORAL | 2 refills | Status: DC

## 2021-09-03 MED ORDER — METFORMIN HCL 500 MG PO TABS: 500.0000 mg | ORAL_TABLET | Freq: Every day | ORAL | 2 refills | Status: DC

## 2021-09-03 MED ORDER — POTASSIUM CHLORIDE CRYS ER 20 MEQ PO TBCR: 20.0000 meq | EXTENDED_RELEASE_TABLET | Freq: Every day | ORAL | 1 refills | Status: DC

## 2021-09-03 NOTE — Progress Notes (Signed)
Rich Brave Llittleton,acting as a Education administrator for Maximino Greenland, MD.,have documented all relevant documentation on the behalf of Maximino Greenland, MD,as directed by  Maximino Greenland, MD while in the presence of Maximino Greenland, MD.  This visit occurred during the SARS-CoV-2 public health emergency.  Safety protocols were in place, including screening questions prior to the visit, additional usage of staff PPE, and extensive cleaning of exam room while observing appropriate contact time as indicated for disinfecting solutions.  Subjective:     Patient ID: Thomas Rowland , male    DOB: 08-29-1955 , 66 y.o.   MRN: [id number]   Chief Complaint  Patient presents with   Annual Exam    HPI  The patient is here today for a physical examination. Reports compliance with meds.  He denies headaches, chest pain and shortness of breath.   Hypertension This is a chronic problem. The current episode started more than 1 year ago. The problem has been gradually worsening since onset. The problem is uncontrolled. Pertinent negatives include no anxiety, blurred vision, headaches or palpitations. There are no associated agents to hypertension. Risk factors for coronary artery disease include obesity and diabetes mellitus. Past treatments include diuretics. The current treatment provides no improvement. There are no compliance problems.  There is no history of angina.  Diabetes He presents for his follow-up diabetic visit. He has type 2 diabetes mellitus. There are no hypoglycemic associated symptoms. Pertinent negatives for hypoglycemia include no dizziness or headaches. Pertinent negatives for diabetes include no blurred vision, no fatigue, no polydipsia, no polyphagia and no polyuria. Symptoms are stable.    Past Medical History:  Diagnosis Date   Coronary artery disease    Diabetes mellitus without complication ([place])    History of COVID-19    Hyperlipidemia    Hypertension    Nonspecific abnormal  electrocardiogram (ECG) (EKG) 06/26/2013     Family History  Family history unknown: Yes     Current Outpatient Medications:    allopurinol (ZYLOPRIM) 100 MG tablet, Take 100 mg by mouth daily as needed., Disp: , Rfl:    aspirin 81 MG EC tablet, Take 1 tablet by mouth daily., Disp: , Rfl:    atorvastatin (LIPITOR) 40 MG tablet, Take 1 tablet (40 mg total) by mouth daily., Disp: 90 tablet, Rfl: 2   Cholecalciferol (VITAMIN D3) 25 MCG (1000 UT) CAPS, Take 1,000 Units by mouth daily., Disp: , Rfl:    clopidogrel (PLAVIX) 75 MG tablet, Take 1 tablet (75 mg total) by mouth daily., Disp: 90 tablet, Rfl: 3   loratadine (CLARITIN) 10 MG tablet, Take 10 mg by mouth daily., Disp: , Rfl:    losartan (COZAAR) 25 MG tablet, Take 1 tablet (25 mg total) by mouth in the morning., Disp: 90 tablet, Rfl: 3   MAGNESIUM OXIDE PO, Take 500 mg by mouth 2 (two) times daily., Disp: , Rfl:    metoprolol succinate (TOPROL-XL) 25 MG 24 hr tablet, TAKE ONE TABLET BY MOUTH EVERY DAY ** Hold if topically blood pressure number less THAN 100 mmhg OR pulse less THAN 60 beat PER minute**, Disp: 90 tablet, Rfl: 0   MITIGARE 0.6 MG CAPS, Take 1 capsule by mouth 3 (three) times daily as needed., Disp: 30 capsule, Rfl: 2   Multiple Vitamin (ONE-DAILY MULTI VITAMINS PO), Take by mouth., Disp: , Rfl:    nitroGLYCERIN (NITROSTAT) 0.4 MG SL tablet, Place 1 tablet (0.4 mg total) under the tongue every 5 (five) minutes as needed for  chest pain. If you require more than two tablets five minutes apart go to the nearest ER via EMS., Disp: 30 tablet, Rfl: 0   sildenafil (REVATIO) 20 MG tablet, TAKE ONE TABLET BY MOUTH EVERY DAY AS NEEDED, Disp: 10 tablet, Rfl: 1   amLODipine (NORVASC) 10 MG tablet, Take 1 tablet (10 mg total) by mouth daily., Disp: 90 tablet, Rfl: 2   metFORMIN (GLUCOPHAGE) 500 MG tablet, Take 1 tablet (500 mg total) by mouth daily with breakfast., Disp: 90 tablet, Rfl: 2   potassium chloride SA (KLOR-CON M) 20 MEQ tablet,  Take 1 tablet (20 mEq total) by mouth daily., Disp: 90 tablet, Rfl: 1   Allergies  Allergen Reactions   Lisinopril Cough     Men's preventive visit. Patient Health Questionnaire (PHQ-2) is  Story Office Visit from 12/18/2020 in [facility]  PHQ-2 Total Score 0     . Patient is on a heart healthy diet. Marital status: Married. Relevant history for alcohol use is:  Social History   Substance and Sexual Activity  Alcohol Use Yes   Comment: occ  . Relevant history for tobacco use is:  Social History   Tobacco Use  Smoking Status Former   Packs/day: 0.25   Years: 30.00   Pack years: 7.50   Types: Cigarettes, E-cigarettes   Start date: 1970   Quit date: 06/26/2001   Years since quitting: 20.2  Smokeless Tobacco Never  .   Review of Systems  Constitutional: Negative.  Negative for fatigue.  HENT: Negative.    Eyes: Negative.  Negative for blurred vision.  Respiratory: Negative.    Cardiovascular: Negative.  Negative for palpitations.  Gastrointestinal: Negative.   Endocrine: Negative.  Negative for polydipsia, polyphagia and polyuria.  Genitourinary: Negative.   Musculoskeletal: Negative.   Skin: Negative.   Allergic/Immunologic: Negative.   Neurological: Negative.  Negative for dizziness and headaches.  Hematological: Negative.   Psychiatric/Behavioral: Negative.      Today's Vitals   09/03/21 1536  BP: 130/78  Pulse: 71  Temp: 99 F (37.2 C)  Weight: 185 lb (83.9 kg)  Height: '5\' 5"'  (1.651 m)  PainSc: 0-No pain   Body mass index is 30.79 kg/m.  Wt Readings from Last 3 Encounters:  09/03/21 185 lb (83.9 kg)  08/18/21 189 lb 6.4 oz (85.9 kg)  04/02/21 181 lb 6.4 oz (82.3 kg)     Objective:  Physical Exam Vitals and nursing note reviewed.  Constitutional:      Appearance: Normal appearance.  HENT:     Head: Normocephalic and atraumatic.     Right Ear: Tympanic membrane, ear canal and external ear normal.     Left Ear:  Tympanic membrane, ear canal and external ear normal.     Nose: Nose normal.     Mouth/Throat:     Mouth: Mucous membranes are moist.     Pharynx: Oropharynx is clear.  Eyes:     Extraocular Movements: Extraocular movements intact.     Conjunctiva/sclera: Conjunctivae normal.     Pupils: Pupils are equal, round, and reactive to light.  Cardiovascular:     Rate and Rhythm: Normal rate and regular rhythm.     Pulses: Normal pulses.          Dorsalis pedis pulses are 2+ on the right side and 2+ on the left side.     Heart sounds: Normal heart sounds.  Pulmonary:     Effort: Pulmonary effort is normal.  Breath sounds: Normal breath sounds.  Chest:  Breasts:    Right: Normal. No swelling, bleeding, inverted nipple, mass or nipple discharge.     Left: Normal. No swelling, bleeding, inverted nipple, mass or nipple discharge.  Abdominal:     General: Abdomen is flat. Bowel sounds are normal.     Palpations: Abdomen is soft.  Genitourinary:    Prostate: Normal.     Rectum: Normal. Guaiac result negative.  Musculoskeletal:        General: Normal range of motion.     Cervical back: Normal range of motion and neck supple.  Feet:     Right foot:     Protective Sensation: 5 sites tested.  5 sites sensed.     Skin integrity: Callus and dry skin present.     Toenail Condition: Right toenails are abnormally thick.     Left foot:     Protective Sensation: 5 sites tested.  5 sites sensed.     Skin integrity: Callus and dry skin present.     Toenail Condition: Left toenails are abnormally thick.  Skin:    General: Skin is warm.  Neurological:     General: No focal deficit present.     Mental Status: He is alert.  Psychiatric:        Mood and Affect: Mood normal.        Behavior: Behavior normal.     Assessment And Plan:    1. Encounter for general adult medical examination w/o abnormal findings Comments: A full exam was performed. DRE performed, stool heme negative. PATIENT IS  ADVISED TO GET 30-45 MINUTES REGULAR EXERCISE NO LESS THAN FOUR TO FIVE DAYS PER WEEK - BOTH WEIGHTBEARING EXERCISES AND AEROBIC ARE RECOMMENDED.  PATIENT IS ADVISED TO FOLLOW A HEALTHY DIET WITH AT LEAST SIX FRUITS/VEGGIES PER DAY, DECREASE INTAKE OF RED MEAT, AND TO INCREASE FISH INTAKE TO TWO DAYS PER WEEK.  MEATS/FISH SHOULD NOT BE FRIED, BAKED OR BROILED IS PREFERABLE.  IT IS ALSO IMPORTANT TO CUT BACK ON YOUR SUGAR INTAKE. PLEASE AVOID ANYTHING WITH ADDED SUGAR, CORN SYRUP OR OTHER SWEETENERS. IF YOU MUST USE A SWEETENER, YOU CAN TRY STEVIA. IT IS ALSO IMPORTANT TO AVOID ARTIFICIALLY SWEETENERS AND DIET BEVERAGES. LASTLY, I SUGGEST WEARING SPF 50 SUNSCREEN ON EXPOSED PARTS AND ESPECIALLY WHEN IN THE DIRECT SUNLIGHT FOR AN EXTENDED PERIOD OF TIME.  PLEASE AVOID FAST FOOD RESTAURANTS AND INCREASE YOUR WATER INTAKE. - Lipid panel - CBC no Diff - Hemoglobin A1c - PSA - CMP14+EGFR  2. Hypertensive heart disease without heart failure Comments: Chronic, controlled. He is encouraged to limit his sodium intake. Recent EKG reviewed. He is on losartan/amlodipine/metoprolol therapy.  3. Type 2 diabetes mellitus with other specified complication, without long-term current use of insulin (HCC) Comments: Diabetic foot exam was performed. We discussed use of Farxiga for renal/cardiac protection. Will check labs and discuss further when results available.  I DISCUSSED WITH THE PATIENT AT LENGTH REGARDING THE GOALS OF GLYCEMIC CONTROL AND POSSIBLE LONG-TERM COMPLICATIONS.  I  ALSO STRESSED THE IMPORTANCE OF COMPLIANCE WITH HOME GLUCOSE MONITORING, DIETARY RESTRICTIONS INCLUDING AVOIDANCE OF SUGARY DRINKS/PROCESSED FOODS,  ALONG WITH REGULAR EXERCISE.  I  ALSO STRESSED THE IMPORTANCE OF ANNUAL EYE EXAMS, SELF FOOT CARE AND COMPLIANCE WITH OFFICE VISITS. - POCT Urinalysis Dipstick (81002) - Urine Albumin-Creatinine with uACR  4. Mixed hyperlipidemia Comments: Chronic, he is on atorvastatin 34m daily. LDL goal  <70. He is encouraged to exercise regularly, avoid fried foods, minimize stress and  increase fish intake.   5. Callus Comments: I will refer him to Podiatry for further evaluation.  - Ambulatory referral to Podiatry  6. Vitamin D deficiency disease Comments: I will check a vitamin D level and supplement as needed.   7. Class 1 obesity due to excess calories with serious comorbidity and body mass index (BMI) of 30.0 to 30.9 in adult Comments: He is encouraged to aim for at least 150 minutes of exercise per week.   8. Immunization due - Pneumococcal conjugate vaccine 20-valent (Prevnar-20)  9. Encounter for Hemoccult screening Comments: DRE performed, stool heme negative.  - POC Hemoccult Bld/Stl (1-Cd Office Dx)  Patient was given opportunity to ask questions. Patient verbalized understanding of the plan and was able to repeat key elements of the plan. All questions were answered to their satisfaction.   I, Maximino Greenland, MD, have reviewed all documentation for this visit. The documentation on 09/06/21 for the exam, diagnosis, procedures, and orders are all accurate and complete.   THE PATIENT IS ENCOURAGED TO PRACTICE SOCIAL DISTANCING DUE TO THE COVID-19 PANDEMIC.

## 2021-09-03 NOTE — Patient Instructions (Addendum)
Health Maintenance, Male Adopting a healthy lifestyle and getting preventive care are important in promoting health and wellness. Ask your health care provider about: The right schedule for you to have regular tests and exams. Things you can do on your own to prevent diseases and keep yourself healthy. What should I know about diet, weight, and exercise? Eat a healthy diet  Eat a diet that includes plenty of vegetables, fruits, low-fat dairy products, and lean protein. Do not eat a lot of foods that are high in solid fats, added sugars, or sodium. Maintain a healthy weight Body mass index (BMI) is a measurement that can be used to identify possible weight problems. It estimates body fat based on height and weight. Your health care provider can help determine your BMI and help you achieve or maintain a healthy weight. Get regular exercise Get regular exercise. This is one of the most important things you can do for your health. Most adults should: Exercise for at least 150 minutes each week. The exercise should increase your heart rate and make you sweat (moderate-intensity exercise). Do strengthening exercises at least twice a week. This is in addition to the moderate-intensity exercise. Spend less time sitting. Even light physical activity can be beneficial. Watch cholesterol and blood lipids Have your blood tested for lipids and cholesterol at 67 years of age, then have this test every 5 years. You may need to have your cholesterol levels checked more often if: Your lipid or cholesterol levels are high. You are older than 67 years of age. You are at high risk for heart disease. What should I know about cancer screening? Many types of cancers can be detected early and may often be prevented. Depending on your health history and family history, you may need to have cancer screening at various ages. This may include screening for: Colorectal cancer. Prostate cancer. Skin cancer. Lung  cancer. What should I know about heart disease, diabetes, and high blood pressure? Blood pressure and heart disease High blood pressure causes heart disease and increases the risk of stroke. This is more likely to develop in people who have high blood pressure readings or are overweight. Talk with your health care provider about your target blood pressure readings. Have your blood pressure checked: Every 3-5 years if you are 18-39 years of age. Every year if you are 40 years old or older. If you are between the ages of 65 and 75 and are a current or former smoker, ask your health care provider if you should have a one-time screening for abdominal aortic aneurysm (AAA). Diabetes Have regular diabetes screenings. This checks your fasting blood sugar level. Have the screening done: Once every three years after age 45 if you are at a normal weight and have a low risk for diabetes. More often and at a younger age if you are overweight or have a high risk for diabetes. What should I know about preventing infection? Hepatitis B If you have a higher risk for hepatitis B, you should be screened for this virus. Talk with your health care provider to find out if you are at risk for hepatitis B infection. Hepatitis C Blood testing is recommended for: Everyone born from 1945 through 1965. Anyone with known risk factors for hepatitis C. Sexually transmitted infections (STIs) You should be screened each year for STIs, including gonorrhea and chlamydia, if: You are sexually active and are younger than 67 years of age. You are older than 67 years of age and your   health care provider tells you that you are at risk for this type of infection. Your sexual activity has changed since you were last screened, and you are at increased risk for chlamydia or gonorrhea. Ask your health care provider if you are at risk. Ask your health care provider about whether you are at high risk for HIV. Your health care provider  may recommend a prescription medicine to help prevent HIV infection. If you choose to take medicine to prevent HIV, you should first get tested for HIV. You should then be tested every 3 months for as long as you are taking the medicine. Follow these instructions at home: Alcohol use Do not drink alcohol if your health care provider tells you not to drink. If you drink alcohol: Limit how much you have to 0-2 drinks a day. Know how much alcohol is in your drink. In the [place] one drink equals one 12 oz bottle of beer (355 mL), one 5 oz glass of wine (148 mL), or one 1 oz glass of hard liquor (44 mL). Lifestyle Do not use any products that contain nicotine or tobacco. These products include cigarettes, chewing tobacco, and vaping devices, such as e-cigarettes. If you need help quitting, ask your health care provider. Do not use street drugs. Do not share needles. Ask your health care provider for help if you need support or information about quitting drugs. General instructions Schedule regular health, dental, and eye exams. Stay current with your vaccines. Tell your health care provider if: You often feel depressed. You have ever been abused or do not feel safe at home. Summary Adopting a healthy lifestyle and getting preventive care are important in promoting health and wellness. Follow your health care provider's instructions about healthy diet, exercising, and getting tested or screened for diseases. Follow your health care provider's instructions on monitoring your cholesterol and blood pressure. This information is not intended to replace advice given to you by your health care provider. Make sure you discuss any questions you have with your health care provider. Document Revised: 09/02/2020 Document Reviewed: 09/02/2020 Elsevier Patient Education  2023 [employer]  

## 2021-09-04 LAB — MICROALBUMIN / CREATININE URINE RATIO
Creatinine, Urine: 158.8 mg/dL
Microalb/Creat Ratio: 171 mg/g creat — ABNORMAL HIGH (ref 0–29)
Microalbumin, Urine: 270.9 ug/mL

## 2021-09-04 LAB — CMP14+EGFR
ALT: 19 IU/L (ref 0–44)
AST: 24 IU/L (ref 0–40)
Albumin/Globulin Ratio: 1.7 (ref 1.2–2.2)
Albumin: 4.2 g/dL (ref 3.8–4.8)
Alkaline Phosphatase: 74 IU/L (ref 44–121)
BUN/Creatinine Ratio: 15 (ref 10–24)
BUN: 19 mg/dL (ref 8–27)
Bilirubin Total: 0.2 mg/dL (ref 0.0–1.2)
CO2: 27 mmol/L (ref 20–29)
Calcium: 9.3 mg/dL (ref 8.6–10.2)
Chloride: 102 mmol/L (ref 96–106)
Creatinine, Ser: 1.23 mg/dL (ref 0.76–1.27)
Globulin, Total: 2.5 g/dL (ref 1.5–4.5)
Glucose: 87 mg/dL (ref 70–99)
Potassium: 4.4 mmol/L (ref 3.5–5.2)
Sodium: 143 mmol/L (ref 134–144)
Total Protein: 6.7 g/dL (ref 6.0–8.5)
eGFR: 65 mL/min/{1.73_m2} (ref >59)

## 2021-09-04 LAB — CBC
Hematocrit: 39.4 % (ref 37.5–51.0)
Hemoglobin: 13 g/dL (ref 13.0–17.7)
MCH: 27.5 pg (ref 26.6–33.0)
MCHC: 33 g/dL (ref 31.5–35.7)
MCV: 83 fL (ref 79–97)
Platelets: 179 10*3/uL (ref 150–450)
RBC: 4.73 x10E6/uL (ref 4.14–5.80)
RDW: 12.8 % (ref 11.6–15.4)
WBC: 8.1 10*3/uL (ref 3.4–10.8)

## 2021-09-04 LAB — HEMOGLOBIN A1C
Est. average glucose Bld gHb Est-mCnc: 117 mg/dL
Hgb A1c MFr Bld: 5.7 % — ABNORMAL HIGH (ref 4.8–5.6)

## 2021-09-04 LAB — LIPID PANEL
Chol/HDL Ratio: 2.6 ratio (ref 0.0–5.0)
Cholesterol, Total: 133 mg/dL (ref 100–199)
HDL: 51 mg/dL (ref >39)
LDL Chol Calc (NIH): 67 mg/dL (ref 0–99)
Triglycerides: 77 mg/dL (ref 0–149)
VLDL Cholesterol Cal: 15 mg/dL (ref 5–40)

## 2021-09-04 LAB — PSA: Prostate Specific Ag, Serum: 4 ng/mL (ref 0.0–4.0)

## 2021-09-11 ENCOUNTER — Ambulatory Visit: Payer: PPO

## 2021-09-15 ENCOUNTER — Other Ambulatory Visit: Payer: Self-pay | Admitting: Internal Medicine

## 2021-09-15 DIAGNOSIS — Z7184 Encounter for health counseling related to travel: Secondary | ICD-10-CM

## 2021-09-18 ENCOUNTER — Ambulatory Visit: Payer: PPO

## 2021-09-18 DIAGNOSIS — H2513 Age-related nuclear cataract, bilateral: Secondary | ICD-10-CM | POA: Diagnosis not present

## 2021-09-18 DIAGNOSIS — H11042 Peripheral pterygium, stationary, left eye: Secondary | ICD-10-CM | POA: Diagnosis not present

## 2021-09-18 DIAGNOSIS — H401131 Primary open-angle glaucoma, bilateral, mild stage: Secondary | ICD-10-CM | POA: Diagnosis not present

## 2021-09-18 LAB — HM DIABETES EYE EXAM

## 2021-09-28 ENCOUNTER — Encounter: Payer: Self-pay | Admitting: Internal Medicine

## 2021-09-30 ENCOUNTER — Ambulatory Visit (INDEPENDENT_AMBULATORY_CARE_PROVIDER_SITE_OTHER): Payer: PPO

## 2021-09-30 ENCOUNTER — Other Ambulatory Visit: Payer: Self-pay | Admitting: Cardiology

## 2021-09-30 VITALS — BP 138/80 | HR 84 | Temp 98.2°F

## 2021-09-30 DIAGNOSIS — Z23 Encounter for immunization: Secondary | ICD-10-CM

## 2021-09-30 NOTE — Progress Notes (Signed)
Patient presents today for 1st shingles vaccine.  

## 2021-10-02 ENCOUNTER — Ambulatory Visit (INDEPENDENT_AMBULATORY_CARE_PROVIDER_SITE_OTHER): Payer: PPO

## 2021-10-02 VITALS — Ht 65.0 in | Wt 185.0 lb

## 2021-10-02 DIAGNOSIS — Z0000 Encounter for general adult medical examination without abnormal findings: Secondary | ICD-10-CM

## 2021-10-02 NOTE — Patient Instructions (Signed)
[name] , Thank you for taking time to come for your Medicare Wellness Visit. I appreciate your ongoing commitment to your health goals. Please review the following plan we discussed and let me know if I can assist you in the future.   Screening recommendations/referrals: Colonoscopy: completed 02/03/2013, due 02/04/2023 Recommended yearly ophthalmology/optometry visit for glaucoma screening and checkup Recommended yearly dental visit for hygiene and checkup  Vaccinations: Influenza vaccine: due 11/25/2021 Pneumococcal vaccine: completed 09/03/2021 Tdap vaccine: completed 08/13/2020, due 08/14/2030 Shingles vaccine: needs second dose   Covid-19:  01/22/2021, 03/11/2020, 08/02/2019, 07/12/2019  Advanced directives: Advance directive discussed with you today.   Conditions/risks identified: none  Next appointment: Follow up in one year for your annual wellness visit.   Preventive Care 24 Years and Older, Male Preventive care refers to lifestyle choices and visits with your health care provider that can promote health and wellness. What does preventive care include? A yearly physical exam. This is also called an annual well check. Dental exams once or twice a year. Routine eye exams. Ask your health care provider how often you should have your eyes checked. Personal lifestyle choices, including: Daily care of your teeth and gums. Regular physical activity. Eating a healthy diet. Avoiding tobacco and drug use. Limiting alcohol use. Practicing safe sex. Taking low doses of aspirin every day. Taking vitamin and mineral supplements as recommended by your health care provider. What happens during an annual well check? The services and screenings done by your health care provider during your annual well check will depend on your age, overall health, lifestyle risk factors, and family history of disease. Counseling  Your health care provider may ask you questions about your: Alcohol  use. Tobacco use. Drug use. Emotional well-being. Home and relationship well-being. Sexual activity. Eating habits. History of falls. Memory and ability to understand (cognition). Work and work Statistician. Screening  You may have the following tests or measurements: Height, weight, and BMI. Blood pressure. Lipid and cholesterol levels. These may be checked every 5 years, or more frequently if you are over 39 years old. Skin check. Lung cancer screening. You may have this screening every year starting at age 66 if you have a 30-pack-year history of smoking and currently smoke or have quit within the past 15 years. Fecal occult blood test (FOBT) of the stool. You may have this test every year starting at age 44. Flexible sigmoidoscopy or colonoscopy. You may have a sigmoidoscopy every 5 years or a colonoscopy every 10 years starting at age 107. Prostate cancer screening. Recommendations will vary depending on your family history and other risks. Hepatitis C blood test. Hepatitis B blood test. Sexually transmitted disease (STD) testing. Diabetes screening. This is done by checking your blood sugar (glucose) after you have not eaten for a while (fasting). You may have this done every 1-3 years. Abdominal aortic aneurysm (AAA) screening. You may need this if you are a current or former smoker. Osteoporosis. You may be screened starting at age 59 if you are at high risk. Talk with your health care provider about your test results, treatment options, and if necessary, the need for more tests. Vaccines  Your health care provider may recommend certain vaccines, such as: Influenza vaccine. This is recommended every year. Tetanus, diphtheria, and acellular pertussis (Tdap, Td) vaccine. You may need a Td booster every 10 years. Zoster vaccine. You may need this after age 26. Pneumococcal 13-valent conjugate (PCV13) vaccine. One dose is recommended after age 39. Pneumococcal polysaccharide  (  PPSV23) vaccine. One dose is recommended after age 24. Talk to your health care provider about which screenings and vaccines you need and how often you need them. This information is not intended to replace advice given to you by your health care provider. Make sure you discuss any questions you have with your health care provider. Document Released: 05/10/2015 Document Revised: 01/01/2016 Document Reviewed: 02/12/2015 Elsevier Interactive Patient Education  2017 [employer] Prevention in the Home Falls can cause injuries. They can happen to people of all ages. There are many things you can do to make your home safe and to help prevent falls. What can I do on the outside of my home? Regularly fix the edges of walkways and driveways and fix any cracks. Remove anything that might make you trip as you walk through a door, such as a raised step or threshold. Trim any bushes or trees on the path to your home. Use bright outdoor lighting. Clear any walking paths of anything that might make someone trip, such as rocks or tools. Regularly check to see if handrails are loose or broken. Make sure that both sides of any steps have handrails. Any raised decks and porches should have guardrails on the edges. Have any leaves, snow, or ice cleared regularly. Use sand or salt on walking paths during winter. Clean up any spills in your garage right away. This includes oil or grease spills. What can I do in the bathroom? Use night lights. Install grab bars by the toilet and in the tub and shower. Do not use towel bars as grab bars. Use non-skid mats or decals in the tub or shower. If you need to sit down in the shower, use a plastic, non-slip stool. Keep the floor dry. Clean up any water that spills on the floor as soon as it happens. Remove soap buildup in the tub or shower regularly. Attach bath mats securely with double-sided non-slip rug tape. Do not have throw rugs and other things on the  floor that can make you trip. What can I do in the bedroom? Use night lights. Make sure that you have a light by your bed that is easy to reach. Do not use any sheets or blankets that are too big for your bed. They should not hang down onto the floor. Have a firm chair that has side arms. You can use this for support while you get dressed. Do not have throw rugs and other things on the floor that can make you trip. What can I do in the kitchen? Clean up any spills right away. Avoid walking on wet floors. Keep items that you use a lot in easy-to-reach places. If you need to reach something above you, use a strong step stool that has a grab bar. Keep electrical cords out of the way. Do not use floor polish or wax that makes floors slippery. If you must use wax, use non-skid floor wax. Do not have throw rugs and other things on the floor that can make you trip. What can I do with my stairs? Do not leave any items on the stairs. Make sure that there are handrails on both sides of the stairs and use them. Fix handrails that are broken or loose. Make sure that handrails are as long as the stairways. Check any carpeting to make sure that it is firmly attached to the stairs. Fix any carpet that is loose or worn. Avoid having throw rugs at the top or bottom  of the stairs. If you do have throw rugs, attach them to the floor with carpet tape. Make sure that you have a light switch at the top of the stairs and the bottom of the stairs. If you do not have them, ask someone to add them for you. What else can I do to help prevent falls? Wear shoes that: Do not have high heels. Have rubber bottoms. Are comfortable and fit you well. Are closed at the toe. Do not wear sandals. If you use a stepladder: Make sure that it is fully opened. Do not climb a closed stepladder. Make sure that both sides of the stepladder are locked into place. Ask someone to hold it for you, if possible. Clearly mark and make  sure that you can see: Any grab bars or handrails. First and last steps. Where the edge of each step is. Use tools that help you move around (mobility aids) if they are needed. These include: Canes. Walkers. Scooters. Crutches. Turn on the lights when you go into a dark area. Replace any light bulbs as soon as they burn out. Set up your furniture so you have a clear path. Avoid moving your furniture around. If any of your floors are uneven, fix them. If there are any pets around you, be aware of where they are. Review your medicines with your doctor. Some medicines can make you feel dizzy. This can increase your chance of falling. Ask your doctor what other things that you can do to help prevent falls. This information is not intended to replace advice given to you by your health care provider. Make sure you discuss any questions you have with your health care provider. Document Released: 02/07/2009 Document Revised: 09/19/2015 Document Reviewed: 05/18/2014 Elsevier Interactive Patient Education  2017 [employer].

## 2021-10-02 NOTE — Progress Notes (Signed)
I connected with Thomas Rowland today by telephone and verified that I am speaking with the correct person using two identifiers. Location patient: home Location provider: work Persons participating in the virtual visit: Verlee Rossetti, Mrs. Imelda Pillow LPN.   I discussed the limitations, risks, security and privacy concerns of performing an evaluation and management service by telephone and the availability of in person appointments. I also discussed with the patient that there may be a patient responsible charge related to this service. The patient expressed understanding and verbally consented to this telephonic visit.    Interactive audio and video telecommunications were attempted between this provider and patient, however failed, due to patient having technical difficulties OR patient did not have access to video capability.  We continued and completed visit with audio only.     Vital signs may be patient reported or missing.  Subjective:   Thomas Rowland is a 66 y.o. male who presents for an Initial Medicare Annual Wellness Visit.  Review of Systems     Cardiac Risk Factors include: advanced age (>44men, >27 women);hypertension;male gender;obesity (BMI >30kg/m2)     Objective:    Today's Vitals   10/02/21 1559 10/02/21 1602  Weight: 185 lb (83.9 kg)   Height:  (1.651 m)   PainSc:  8    Body mass index is 30.79 kg/m.     10/02/2021    4:15 PM 10/23/2015   11:15 PM  Advanced Directives  Does Patient Have a Medical Advance Directive? No No  Would patient like information on creating a medical advance directive?  No - patient declined information    Current Medications (verified) Outpatient Encounter Medications as of 10/02/2021  Medication Sig   allopurinol (ZYLOPRIM) 100 MG tablet Take 100 mg by mouth daily as needed.   amLODipine (NORVASC) 10 MG tablet Take 1 tablet (10 mg total) by mouth daily.   aspirin 81 MG EC tablet Take 1 tablet by mouth daily.    atorvastatin (LIPITOR) 40 MG tablet Take 1 tablet (40 mg total) by mouth daily.   Cholecalciferol (VITAMIN D3) 25 MCG (1000 UT) CAPS Take 1,000 Units by mouth daily.   clopidogrel (PLAVIX) 75 MG tablet Take 1 tablet (75 mg total) by mouth daily.   loratadine (CLARITIN) 10 MG tablet Take 10 mg by mouth daily.   MAGNESIUM OXIDE PO Take 500 mg by mouth 2 (two) times daily.   metFORMIN (GLUCOPHAGE) 500 MG tablet Take 1 tablet (500 mg total) by mouth daily with breakfast.   metoprolol succinate (TOPROL-XL) 25 MG 24 hr tablet TAKE ONE TABLET BY MOUTH EVERY DAY ** Hold if topically blood pressure number less THAN 100 mmhg OR pulse less THAN 60 beat PER minute**   MITIGARE 0.6 MG CAPS Take 1 capsule by mouth 3 (three) times daily as needed.   Multiple Vitamin (ONE-DAILY MULTI VITAMINS PO) Take by mouth.   potassium chloride SA (KLOR-CON M) 20 MEQ tablet Take 1 tablet (20 mEq total) by mouth daily.   sildenafil (REVATIO) 20 MG tablet TAKE ONE TABLET BY MOUTH EVERY DAY AS NEEDED   losartan (COZAAR) 25 MG tablet Take 1 tablet (25 mg total) by mouth in the morning.   nitroGLYCERIN (NITROSTAT) 0.4 MG SL tablet Place 1 tablet (0.4 mg total) under the tongue every 5 (five) minutes as needed for chest pain. If you require more than two tablets five minutes apart go to the nearest ER via EMS.   No facility-administered encounter medications on file as  of 10/02/2021.    Allergies (verified) Lisinopril   History: Past Medical History:  Diagnosis Date   Coronary artery disease    Diabetes mellitus without complication (HCC)    History of COVID-19    Hyperlipidemia    Hypertension    Nonspecific abnormal electrocardiogram (ECG) (EKG) 06/26/2013   Past Surgical History:  Procedure Laterality Date   COLONOSCOPY  2014   Normal   CORONARY ANGIOPLASTY WITH STENT PLACEMENT     LEG [place] 1997   [place]   Family History  Family history unknown: Yes   Social History   Socioeconomic History   Marital  status: Married    Spouse name: Not on file   Number of children: 3   Years of education: Not on file   Highest education level: Not on file  Occupational History    Employer: [employer]  Tobacco Use   Smoking status: Former    Packs/day: 0.25    Years: 30.00    Total pack years: 7.50    Types: Cigarettes, E-cigarettes    Start date: 1970    Quit date: 06/26/2001    Years since quitting: 20.2   Smokeless tobacco: Never  Vaping Use   Vaping Use: Never used  Substance and Sexual Activity   Alcohol use: Yes    Comment: occ   Drug use: No   Sexual activity: Not on file  Other Topics Concern   Not on file  Social History Narrative   Not on file   Social Determinants of Health   Financial Resource Strain: Medium Risk (10/02/2021)   Overall Financial Resource Strain (CARDIA)    Difficulty of Paying Living Expenses: Somewhat hard  Food Insecurity: No Food Insecurity (10/02/2021)   Hunger Vital Sign    Worried About Running Out of Food in the Last Year: Never true    Ran Out of Food in the Last Year: Never true  Transportation Needs: No Transportation Needs (10/02/2021)   PRAPARE - Administrator, Civil ServiceTransportation    Lack of Transportation (Medical): No    Lack of Transportation (Non-Medical): No  Physical Activity: Inactive (10/02/2021)   Exercise Vital Sign    Days of Exercise per Week: 0 days    Minutes of Exercise per Session: 0 min  Stress: No Stress Concern Present (10/02/2021)   [employer] - Occupational Stress Questionnaire    Feeling of Stress : Not at all  Social Connections: Not on file    Tobacco Counseling Counseling given: Not Answered   Clinical Intake:  Pre-visit preparation completed: Yes  Pain : 0-10 Pain Score: 8  Pain Type: Acute pain Pain Location: Knee Pain Orientation: Right Pain Descriptors / Indicators: Throbbing Pain Onset: Today Pain Frequency: Constant     Nutritional Status: BMI > 30  Obese Nutritional Risks: None Diabetes:  No  How often do you need to have someone help you when you read instructions, pamphlets, or other written materials from your doctor or pharmacy?: 1 - Never  Diabetic? no  Interpreter Needed?: No  Information entered by :: NAllen LPN   Activities of Daily Living    10/02/2021    4:20 PM 12/18/2020    4:17 PM  In your present state of health, do you have any difficulty performing the following activities:  Hearing? 0 0  Vision? 0 0  Difficulty concentrating or making decisions? 0 0  Walking or climbing stairs? 0 0  Dressing or bathing? 0 0  Doing errands, shopping? 0 0  Preparing Food and eating ? N   Using the Toilet? N   In the past six months, have you accidently leaked urine? N   Do you have problems with loss of bowel control? N   Managing your Medications? N   Managing your Finances? N   Housekeeping or managing your Housekeeping? N     Patient Care Team: Dorothyann Peng, MD as PCP - General (Internal Medicine)  Indicate any recent Medical Services you may have received from other than [facility] providers in the past year (date may be approximate).     Assessment:   This is a routine wellness examination for [facility].  Hearing/Vision screen Vision Screening - Comments:: Regular eye exams,   Dietary issues and exercise activities discussed: Current Exercise Habits: The patient has a physically strenuous job, but has no regular exercise apart from work.   Goals Addressed             This Visit's Progress    Patient Stated       10/02/2021, wants to get health issues under control and live a healthy and long life       Depression Screen    10/02/2021    4:20 PM 12/18/2020    4:17 PM 09/12/2019    3:35 PM  PHQ 2/9 Scores  PHQ - 2 Score 0 0 0  PHQ- 9 Score  0     Fall Risk    10/02/2021    4:19 PM 12/18/2020    4:17 PM 09/12/2019    3:35 PM  Fall Risk   Falls in the past year? 0 0 0  Number falls in past yr: 0 0   Injury with Fall? 0 0   Risk for fall due  to : Medication side effect    Follow up Falls evaluation completed;Education provided;Falls prevention discussed      FALL RISK PREVENTION PERTAINING TO THE HOME:  Any stairs in or around the home? No  If so, are there any without handrails?  N/a Home free of loose throw rugs in walkways, pet beds, electrical cords, etc? Yes  Adequate lighting in your home to reduce risk of falls? Yes   ASSISTIVE DEVICES UTILIZED TO PREVENT FALLS:  Life alert? No  Use of a cane, walker or w/c? No  Grab bars in the bathroom? No  Shower chair or bench in shower? No  Elevated toilet seat or a handicapped toilet? No   TIMED UP AND GO:  Was the test performed? No .      Cognitive Function:        Immunizations Immunization History  Administered Date(s) Administered   Fluad Quad(high Dose 65+) 04/02/2021   Influenza, High Dose Seasonal PF 05/21/2020   PFIZER(Purple Top)SARS-COV-2 Vaccination 07/12/2019, 08/02/2019, 03/11/2020   PNEUMOCOCCAL CONJUGATE-20 09/03/2021   Pfizer Covid-19 Vaccine Bivalent Booster 18yrs & up 01/22/2021   Tdap 08/13/2020   Zoster Recombinat (Shingrix) 09/30/2021    TDAP status: Up to date  Flu Vaccine status: Up to date  Pneumococcal vaccine status: Up to date  Covid-19 vaccine status: Completed vaccines  Qualifies for Shingles Vaccine? Yes   Zostavax completed No   Shingrix Completed?: needs second dose  Screening Tests Health Maintenance  Topic Date Due   INFLUENZA VACCINE  11/25/2021   Zoster Vaccines- Shingrix (2 of 2) 11/25/2021   URINE MICROALBUMIN  09/04/2022   COLONOSCOPY (Pts 45-77yrs Insurance coverage will need to be confirmed)  02/04/2023   TETANUS/TDAP  08/14/2030   Pneumonia  Vaccine 52+ Years old  Completed   COVID-19 Vaccine  Completed   Hepatitis C Screening  Completed   HPV VACCINES  Aged Out    Health Maintenance  There are no preventive care reminders to display for this patient.   Colorectal cancer screening: Type of  screening: Colonoscopy. Completed 02/03/2013. Repeat every 10 years  Lung Cancer Screening: (Low Dose CT Chest recommended if Age 57-80 years, 30 pack-year currently smoking OR have quit w/in 15years.) does not qualify.   Lung Cancer Screening Referral: no  Additional Screening:  Hepatitis C Screening: does qualify; Completed 09/12/2019  Vision Screening: Recommended annual ophthalmology exams for early detection of glaucoma and other disorders of the eye. Is the patient up to date with their annual eye exam?  Yes  Who is the provider or what is the name of the office in which the patient attends annual eye exams? Can't remember name  If pt is not established with a provider, would they like to be referred to a provider to establish care? No .   Dental Screening: Recommended annual dental exams for proper oral hygiene  Community Resource Referral / Chronic Care Management: CRR required this visit?  No   CCM required this visit?  No      Plan:     I have personally reviewed and noted the following in the patient's chart:   Medical and social history Use of alcohol, tobacco or illicit drugs  Current medications and supplements including opioid prescriptions. Patient is not currently taking opioid prescriptions. Functional ability and status Nutritional status Physical activity Advanced directives List of other physicians Hospitalizations, surgeries, and ER visits in previous 12 months Vitals Screenings to include cognitive, depression, and falls Referrals and appointments  In addition, I have reviewed and discussed with patient certain preventive protocols, quality metrics, and best practice recommendations. A written personalized care plan for preventive services as well as general preventive health recommendations were provided to patient.     Barb Merino, LPN   05/30/4823   Nurse Notes: 6 CIT not administered due to language barrier. Mrs. O translated during the  visit.  Due to this being a virtual visit, the after visit summary with patients personalized plan was offered to patient via mail or my-chart. Patient would like to access on my-chart

## 2021-10-06 ENCOUNTER — Other Ambulatory Visit: Payer: Self-pay | Admitting: Internal Medicine

## 2021-10-06 ENCOUNTER — Encounter: Payer: Self-pay | Admitting: Internal Medicine

## 2021-10-06 ENCOUNTER — Other Ambulatory Visit: Payer: Self-pay

## 2021-10-06 ENCOUNTER — Other Ambulatory Visit: Payer: Self-pay | Admitting: Cardiology

## 2021-10-06 DIAGNOSIS — I2510 Atherosclerotic heart disease of native coronary artery without angina pectoris: Secondary | ICD-10-CM

## 2021-10-06 DIAGNOSIS — M25521 Pain in right elbow: Secondary | ICD-10-CM

## 2021-10-06 MED ORDER — ATORVASTATIN CALCIUM 40 MG PO TABS: 40.0000 mg | ORAL_TABLET | Freq: Every day | ORAL | 2 refills | Status: DC

## 2021-10-06 MED ORDER — ALLOPURINOL 100 MG PO TABS: 100.0000 mg | ORAL_TABLET | Freq: Every day | ORAL | 1 refills | Status: DC | PRN

## 2021-10-06 MED ORDER — MITIGARE 0.6 MG PO CAPS: 1.0000 | ORAL_CAPSULE | Freq: Three times a day (TID) | ORAL | 2 refills | Status: AC | PRN

## 2021-10-06 MED ORDER — MEFLOQUINE HCL 250 MG PO TABS
250.0000 mg | ORAL_TABLET | ORAL | 0 refills | Status: AC
Start: 2021-10-06 — End: 2021-12-05

## 2021-10-06 MED ORDER — DOXYCYCLINE HYCLATE 100 MG PO TABS
100.0000 mg | ORAL_TABLET | Freq: Two times a day (BID) | ORAL | 0 refills | Status: DC
Start: 2021-10-06 — End: 2022-01-18

## 2021-10-06 MED ORDER — METFORMIN HCL 500 MG PO TABS: 500.0000 mg | ORAL_TABLET | Freq: Every day | ORAL | 2 refills | Status: DC

## 2021-10-06 MED ORDER — POTASSIUM CHLORIDE CRYS ER 20 MEQ PO TBCR: 20.0000 meq | EXTENDED_RELEASE_TABLET | Freq: Every day | ORAL | 1 refills | Status: DC

## 2021-10-06 NOTE — Progress Notes (Signed)
Pt called requesting medication for traveler's diarrhea to take to [place] and [place].

## 2021-10-08 ENCOUNTER — Ambulatory Visit: Payer: PPO

## 2021-11-20 ENCOUNTER — Other Ambulatory Visit: Payer: Self-pay | Admitting: Internal Medicine

## 2021-12-03 ENCOUNTER — Other Ambulatory Visit: Payer: Self-pay | Admitting: Internal Medicine

## 2021-12-17 ENCOUNTER — Ambulatory Visit: Payer: PPO | Admitting: Podiatry

## 2021-12-17 DIAGNOSIS — L988 Other specified disorders of the skin and subcutaneous tissue: Secondary | ICD-10-CM | POA: Diagnosis not present

## 2021-12-17 DIAGNOSIS — L84 Corns and callosities: Secondary | ICD-10-CM

## 2021-12-17 MED ORDER — MELOXICAM 15 MG PO TABS: 15.0000 mg | ORAL_TABLET | Freq: Every day | ORAL | 0 refills | Status: DC

## 2021-12-17 NOTE — Progress Notes (Signed)
Subjective:  Patient ID: Thomas Rowland, male    DOB: 1956-02-26,  MRN: [id number]  No chief complaint on file.   66 y.o. male presents with the above complaint.  Patient presents with right fourth digit heloma molle with underlying macerated skin.  Patient states pain for touch is progressive gotten worse hurts with ambulation.  He states it hurts with pressure he wanted get it evaluated he has not seen anyone else prior to seeing me.  He denies any other acute complaints.  Pain scale is mild to moderate measuring 5 out of 7.  He wears tight shoes as well.   Review of Systems: Negative except as noted in the HPI. Denies N/V/F/Ch.  Past Medical History:  Diagnosis Date   Coronary artery disease    Diabetes mellitus without complication (HCC)    History of COVID-19    Hyperlipidemia    Hypertension    Nonspecific abnormal electrocardiogram (ECG) (EKG) 06/26/2013    Current Outpatient Medications:    allopurinol (ZYLOPRIM) 100 MG tablet, Take 1 tablet (100 mg total) by mouth daily as needed., Disp: 90 tablet, Rfl: 1   amLODipine (NORVASC) 10 MG tablet, Take 1 tablet (10 mg total) by mouth daily., Disp: 90 tablet, Rfl: 2   aspirin 81 MG EC tablet, Take 1 tablet by mouth daily., Disp: , Rfl:    atorvastatin (LIPITOR) 40 MG tablet, Take 1 tablet (40 mg total) by mouth daily., Disp: 90 tablet, Rfl: 2   Cholecalciferol (VITAMIN D3) 25 MCG (1000 UT) CAPS, Take 1,000 Units by mouth daily., Disp: , Rfl:    clopidogrel (PLAVIX) 75 MG tablet, Take 1 tablet (75 mg total) by mouth daily., Disp: 90 tablet, Rfl: 3   doxycycline (VIBRA-TABS) 100 MG tablet, Take 1 tablet (100 mg total) by mouth 2 (two) times daily., Disp: 14 tablet, Rfl: 0   loratadine (CLARITIN) 10 MG tablet, Take 10 mg by mouth daily., Disp: , Rfl:    MAGNESIUM OXIDE PO, Take 500 mg by mouth 2 (two) times daily., Disp: , Rfl:    meloxicam (MOBIC) 15 MG tablet, Take 1 tablet (15 mg total) by mouth daily., Disp: 30 tablet, Rfl: 0    metFORMIN (GLUCOPHAGE) 500 MG tablet, Take 1 tablet (500 mg total) by mouth daily with breakfast., Disp: 90 tablet, Rfl: 2   metoprolol succinate (TOPROL-XL) 25 MG 24 hr tablet, TAKE ONE TABLET BY MOUTH EVERY DAY ** Hold if topically blood pressure number less THAN 100 mmhg OR pulse less THAN 60 beat PER minute**, Disp: 90 tablet, Rfl: 0   MITIGARE 0.6 MG CAPS, Take 1 capsule by mouth 3 (three) times daily as needed., Disp: 30 capsule, Rfl: 2   Multiple Vitamin (ONE-DAILY MULTI VITAMINS PO), Take by mouth., Disp: , Rfl:    potassium chloride SA (KLOR-CON M) 20 MEQ tablet, Take 1 tablet (20 mEq total) by mouth daily., Disp: 90 tablet, Rfl: 1   sildenafil (REVATIO) 20 MG tablet, TAKE ONE TABLET BY MOUTH EVERY DAY AS NEEDED, Disp: 10 tablet, Rfl: 1   losartan (COZAAR) 25 MG tablet, Take 1 tablet (25 mg total) by mouth in the morning., Disp: 90 tablet, Rfl: 3   nitroGLYCERIN (NITROSTAT) 0.4 MG SL tablet, Place 1 tablet (0.4 mg total) under the tongue every 5 (five) minutes as needed for chest pain. If you require more than two tablets five minutes apart go to the nearest ER via EMS., Disp: 30 tablet, Rfl: 0  Social History   Tobacco Use  Smoking  Status Former   Packs/day: 0.25   Years: 30.00   Total pack years: 7.50   Types: Cigarettes, E-cigarettes   Start date: 1970   Quit date: 06/26/2001   Years since quitting: 20.5  Smokeless Tobacco Never    Allergies  Allergen Reactions   Lisinopril Cough   Objective:  There were no vitals filed for this visit. There is no height or weight on file to calculate BMI. Constitutional Well developed. Well nourished.  Vascular Dorsalis pedis pulses palpable bilaterally. Posterior tibial pulses palpable bilaterally. Capillary refill normal to all digits.  No cyanosis or clubbing noted. Pedal hair growth normal.  Neurologic Normal speech. Oriented to person, place, and time. Epicritic sensation to light touch grossly present bilaterally.   Dermatologic Nails within normal limits Skin heloma molle noted to right fourth and fifth digit with interdigital maceration.  Mild pain on palpation.  Orthopedic: Normal joint ROM without pain or crepitus bilaterally. No visible deformities. No bony tenderness.   Radiographs: None Assessment:   1. Skin maceration   2. Heloma molle    Plan:  Patient was evaluated and treated and all questions answered.  Bilateral fourth and fifth digit heloma molle with underlying maceration -All questions and concerns were discussed with the patient in extensive detail.  Using chisel blade handle lesion was debrided down to healthy striated tissue.  Given the amount of maceration at present will benefit from Betadine wet-to-dry dressing.  He agrees with the plan like to proceed with that.  No follow-ups on file. Right fourth and fifth heloma molle Betadine wet-to-dry and shoe gear modification

## 2022-01-05 ENCOUNTER — Ambulatory Visit: Payer: PPO | Admitting: Internal Medicine

## 2022-01-09 ENCOUNTER — Other Ambulatory Visit: Payer: Self-pay | Admitting: Cardiology

## 2022-01-09 DIAGNOSIS — I2510 Atherosclerotic heart disease of native coronary artery without angina pectoris: Secondary | ICD-10-CM

## 2022-01-13 ENCOUNTER — Other Ambulatory Visit: Payer: Self-pay

## 2022-01-13 ENCOUNTER — Ambulatory Visit (INDEPENDENT_AMBULATORY_CARE_PROVIDER_SITE_OTHER): Payer: PPO | Admitting: Internal Medicine

## 2022-01-13 ENCOUNTER — Encounter: Payer: Self-pay | Admitting: Podiatry

## 2022-01-13 ENCOUNTER — Encounter: Payer: Self-pay | Admitting: Internal Medicine

## 2022-01-13 VITALS — BP 150/80 | HR 74 | Temp 98.3°F | Ht 63.8 in | Wt 185.6 lb

## 2022-01-13 DIAGNOSIS — E6609 Other obesity due to excess calories: Secondary | ICD-10-CM

## 2022-01-13 DIAGNOSIS — E1169 Type 2 diabetes mellitus with other specified complication: Secondary | ICD-10-CM | POA: Diagnosis not present

## 2022-01-13 DIAGNOSIS — E261 Secondary hyperaldosteronism: Secondary | ICD-10-CM | POA: Diagnosis not present

## 2022-01-13 DIAGNOSIS — Z6832 Body mass index (BMI) 32.0-32.9, adult: Secondary | ICD-10-CM

## 2022-01-13 DIAGNOSIS — F5101 Primary insomnia: Secondary | ICD-10-CM | POA: Diagnosis not present

## 2022-01-13 DIAGNOSIS — I208 Other forms of angina pectoris: Secondary | ICD-10-CM

## 2022-01-13 DIAGNOSIS — I119 Hypertensive heart disease without heart failure: Secondary | ICD-10-CM

## 2022-01-13 DIAGNOSIS — Z23 Encounter for immunization: Secondary | ICD-10-CM

## 2022-01-13 DIAGNOSIS — I2510 Atherosclerotic heart disease of native coronary artery without angina pectoris: Secondary | ICD-10-CM

## 2022-01-13 MED ORDER — NITROGLYCERIN 0.4 MG SL SUBL: 0.4000 mg | SUBLINGUAL_TABLET | SUBLINGUAL | 0 refills | Status: DC | PRN

## 2022-01-13 NOTE — Patient Instructions (Addendum)
Tart cherry juice - 2 ounces nightly  Diabetes Mellitus and Nutrition, Adult When you have diabetes, or diabetes mellitus, it is very important to have healthy eating habits because your blood sugar (glucose) levels are greatly affected by what you eat and drink. Eating healthy foods in the right amounts, at about the same times every day, can help you: Manage your blood glucose. Lower your risk of heart disease. Improve your blood pressure. Reach or maintain a healthy weight. What can affect my meal plan? Every person with diabetes is different, and each person has different needs for a meal plan. Your health care provider may recommend that you work with a dietitian to make a meal plan that is best for you. Your meal plan may vary depending on factors such as: The calories you need. The medicines you take. Your weight. Your blood glucose, blood pressure, and cholesterol levels. Your activity level. Other health conditions you have, such as heart or kidney disease. How do carbohydrates affect me? Carbohydrates, also called carbs, affect your blood glucose level more than any other type of food. Eating carbs raises the amount of glucose in your blood. It is important to know how many carbs you can safely have in each meal. This is different for every person. Your dietitian can help you calculate how many carbs you should have at each meal and for each snack. How does alcohol affect me? Alcohol can cause a decrease in blood glucose (hypoglycemia), especially if you use insulin or take certain diabetes medicines by mouth. Hypoglycemia can be a life-threatening condition. Symptoms of hypoglycemia, such as sleepiness, dizziness, and confusion, are similar to symptoms of having too much alcohol. Do not drink alcohol if: Your health care provider tells you not to drink. You are pregnant, may be pregnant, or are planning to become pregnant. If you drink alcohol: Limit how much you have to: 0-1  drink a day for women. 0-2 drinks a day for men. Know how much alcohol is in your drink. In the [place] one drink equals one 12 oz bottle of beer (355 mL), one 5 oz glass of wine (148 mL), or one 1 oz glass of hard liquor (44 mL). Keep yourself hydrated with water, diet soda, or unsweetened iced tea. Keep in mind that regular soda, juice, and other mixers may contain a lot of sugar and must be counted as carbs. What are tips for following this plan?  Reading food labels Start by checking the serving size on the Nutrition Facts label of packaged foods and drinks. The number of calories and the amount of carbs, fats, and other nutrients listed on the label are based on one serving of the item. Many items contain more than one serving per package. Check the total grams (g) of carbs in one serving. Check the number of grams of saturated fats and trans fats in one serving. Choose foods that have a low amount or none of these fats. Check the number of milligrams (mg) of salt (sodium) in one serving. Most people should limit total sodium intake to less than 2,300 mg per day. Always check the nutrition information of foods labeled as "low-fat" or "nonfat." These foods may be higher in added sugar or refined carbs and should be avoided. Talk to your dietitian to identify your daily goals for nutrients listed on the label. Shopping Avoid buying canned, pre-made, or processed foods. These foods tend to be high in fat, sodium, and added sugar. Shop around the outside edge  of the grocery store. This is where you will most often find fresh fruits and vegetables, bulk grains, fresh meats, and fresh dairy products. Cooking Use low-heat cooking methods, such as baking, instead of high-heat cooking methods, such as deep frying. Cook using healthy oils, such as olive, canola, or sunflower oil. Avoid cooking with butter, cream, or high-fat meats. Meal planning Eat meals and snacks regularly, preferably at the same  times every day. Avoid going long periods of time without eating. Eat foods that are high in fiber, such as fresh fruits, vegetables, beans, and whole grains. Eat 4-6 oz (112-168 g) of lean protein each day, such as lean meat, chicken, fish, eggs, or tofu. One ounce (oz) (28 g) of lean protein is equal to: 1 oz (28 g) of meat, chicken, or fish. 1 egg.  cup (62 g) of tofu. Eat some foods each day that contain healthy fats, such as avocado, nuts, seeds, and fish. What foods should I eat? Fruits Berries. Apples. Oranges. Peaches. Apricots. Plums. Grapes. Mangoes. Papayas. Pomegranates. Kiwi. Cherries. Vegetables Leafy greens, including lettuce, spinach, kale, chard, collard greens, mustard greens, and cabbage. Beets. Cauliflower. Broccoli. Carrots. Green beans. Tomatoes. Peppers. Onions. Cucumbers. Brussels sprouts. Grains Whole grains, such as whole-wheat or whole-grain bread, crackers, tortillas, cereal, and pasta. Unsweetened oatmeal. Quinoa. Brown or wild rice. Meats and other proteins Seafood. Poultry without skin. Lean cuts of poultry and beef. Tofu. Nuts. Seeds. Dairy Low-fat or fat-free dairy products such as milk, yogurt, and cheese. The items listed above may not be a complete list of foods and beverages you can eat and drink. Contact a dietitian for more information. What foods should I avoid? Fruits Fruits canned with syrup. Vegetables Canned vegetables. Frozen vegetables with butter or cream sauce. Grains Refined white flour and flour products such as bread, pasta, snack foods, and cereals. Avoid all processed foods. Meats and other proteins Fatty cuts of meat. Poultry with skin. Breaded or fried meats. Processed meat. Avoid saturated fats. Dairy Full-fat yogurt, cheese, or milk. Beverages Sweetened drinks, such as soda or iced tea. The items listed above may not be a complete list of foods and beverages you should avoid. Contact a dietitian for more information. Questions  to ask a health care provider Do I need to meet with a certified diabetes care and education specialist? Do I need to meet with a dietitian? What number can I call if I have questions? When are the best times to check my blood glucose? Where to find more information: American Diabetes Association: diabetes.org Academy of Nutrition and Dietetics: eatright[employer] of Diabetes and Digestive and Kidney Diseases: [employer] Association of Diabetes Care & Education Specialists: diabeteseducator.org Summary It is important to have healthy eating habits because your blood sugar (glucose) levels are greatly affected by what you eat and drink. It is important to use alcohol carefully. A healthy meal plan will help you manage your blood glucose and lower your risk of heart disease. Your health care provider may recommend that you work with a dietitian to make a meal plan that is best for you. This information is not intended to replace advice given to you by your health care provider. Make sure you discuss any questions you have with your health care provider. Document Revised: 11/15/2019 Document Reviewed: 11/15/2019 Elsevier Patient Education  [place].

## 2022-01-13 NOTE — Progress Notes (Signed)
Rich Brave [place]leton,acting as a Education administrator for Maximino Greenland, MD.,have documented all relevant documentation on the behalf of Maximino Greenland, MD,as directed by  Maximino Greenland, MD while in the presence of Maximino Greenland, MD.    Subjective:     Patient ID: Thomas Rowland , male    DOB: 06-17-1955 , 66 y.o.   MRN: [id number]   Chief Complaint  Patient presents with   Diabetes   Hypertension    HPI  Pt presents for dm/htn check. He states that he is compliant with medication.  He denies headaches, chest pain and shortness of breath.    Diabetes He presents for his follow-up diabetic visit. He has type 2 diabetes mellitus. There are no hypoglycemic associated symptoms. Pertinent negatives for hypoglycemia include no dizziness or headaches. Pertinent negatives for diabetes include no blurred vision, no chest pain, no fatigue, no polydipsia, no polyphagia and no polyuria. Symptoms are stable.  Hypertension This is a chronic problem. The current episode started more than 1 year ago. The problem has been gradually worsening since onset. The problem is uncontrolled. Pertinent negatives include no anxiety, blurred vision, chest pain, headaches or palpitations. There are no associated agents to hypertension. Risk factors for coronary artery disease include obesity and diabetes mellitus. Past treatments include diuretics. The current treatment provides no improvement. There are no compliance problems.  There is no history of angina.     Past Medical History:  Diagnosis Date   Coronary artery disease    Diabetes mellitus without complication ([place])    History of COVID-19    Hyperlipidemia    Hypertension    Nonspecific abnormal electrocardiogram (ECG) (EKG) 06/26/2013     Family History  Family history unknown: Yes     Current Outpatient Medications:    allopurinol (ZYLOPRIM) 100 MG tablet, Take 1 tablet (100 mg total) by mouth daily as needed., Disp: 90 tablet, Rfl: 1   amLODipine  (NORVASC) 10 MG tablet, Take 1 tablet (10 mg total) by mouth daily., Disp: 90 tablet, Rfl: 2   aspirin 81 MG EC tablet, Take 1 tablet by mouth daily., Disp: , Rfl:    atorvastatin (LIPITOR) 40 MG tablet, Take 1 tablet (40 mg total) by mouth daily., Disp: 90 tablet, Rfl: 2   Cholecalciferol (VITAMIN D3) 25 MCG (1000 UT) CAPS, Take 1,000 Units by mouth daily., Disp: , Rfl:    clopidogrel (PLAVIX) 75 MG tablet, Take 1 tablet (75 mg total) by mouth daily., Disp: 90 tablet, Rfl: 3   loratadine (CLARITIN) 10 MG tablet, Take 10 mg by mouth daily., Disp: , Rfl:    losartan (COZAAR) 25 MG tablet, Take 1 tablet (25 mg total) by mouth in the morning., Disp: 90 tablet, Rfl: 3   MAGNESIUM OXIDE PO, Take 500 mg by mouth 2 (two) times daily., Disp: , Rfl:    meloxicam (MOBIC) 15 MG tablet, Take 1 tablet (15 mg total) by mouth daily., Disp: 30 tablet, Rfl: 0   metFORMIN (GLUCOPHAGE) 500 MG tablet, Take 1 tablet (500 mg total) by mouth daily with breakfast., Disp: 90 tablet, Rfl: 2   metoprolol succinate (TOPROL-XL) 25 MG 24 hr tablet, TAKE ONE TABLET BY MOUTH EVERY DAY ** Hold if blood pressure number less THAN 120mmhg OR pulse less THAN 60 beats PER minute**, Disp: 90 tablet, Rfl: 0   MITIGARE 0.6 MG CAPS, Take 1 capsule by mouth 3 (three) times daily as needed., Disp: 30 capsule, Rfl: 2   Multiple Vitamin (ONE-DAILY  MULTI VITAMINS PO), Take by mouth., Disp: , Rfl:    potassium chloride SA (KLOR-CON M) 20 MEQ tablet, Take 1 tablet (20 mEq total) by mouth daily., Disp: 90 tablet, Rfl: 1   nitroGLYCERIN (NITROSTAT) 0.4 MG SL tablet, Place 1 tablet (0.4 mg total) under the tongue every 5 (five) minutes as needed for chest pain. If you require more than two tablets five minutes apart go to the nearest ER via EMS., Disp: 30 tablet, Rfl: 0   sildenafil (REVATIO) 20 MG tablet, TAKE ONE TABLET BY MOUTH EVERY DAY AS NEEDED (Patient not taking: Reported on 01/13/2022), Disp: 10 tablet, Rfl: 1   Allergies  Allergen Reactions    Lisinopril Cough     Review of Systems  Constitutional: Negative.  Negative for fatigue.  Eyes:  Negative for blurred vision.  Respiratory: Negative.    Cardiovascular: Negative.  Negative for chest pain and palpitations.  Gastrointestinal: Negative.   Endocrine: Negative for polydipsia, polyphagia and polyuria.  Neurological: Negative.  Negative for dizziness and headaches.  Psychiatric/Behavioral:  Positive for sleep disturbance.      Today's Vitals   01/13/22 1630 01/13/22 1638  BP: (!) 156/70 (!) 150/80  Pulse: 74   Temp: 98.3 F (36.8 C)   Weight: 185 lb 9.6 oz (84.2 kg)   Height: 5' 3.8" (1.621 m)   PainSc: 0-No pain    Body mass index is 32.06 kg/m.  Wt Readings from Last 3 Encounters:  01/13/22 185 lb 9.6 oz (84.2 kg)  10/02/21 185 lb (83.9 kg)  09/03/21 185 lb (83.9 kg)    BP Readings from Last 3 Encounters:  01/13/22 (!) 150/80  09/30/21 138/80  09/03/21 130/78    Objective:  Physical Exam Vitals and nursing note reviewed.  Constitutional:      Appearance: Normal appearance.  HENT:     Head: Normocephalic and atraumatic.  Eyes:     Extraocular Movements: Extraocular movements intact.  Cardiovascular:     Rate and Rhythm: Normal rate and regular rhythm.     Heart sounds: Normal heart sounds.  Pulmonary:     Effort: Pulmonary effort is normal.     Breath sounds: Normal breath sounds.  Musculoskeletal:     Cervical back: Normal range of motion.  Skin:    General: Skin is warm.  Neurological:     General: No focal deficit present.     Mental Status: He is alert.  Psychiatric:        Mood and Affect: Mood normal.         Assessment And Plan:     1. Hypertensive heart disease without heart failure Comments: Uncontrolled. He states he did not sleep well, does not want to adjust meds. He will rto in 2 wks for nurse visit/BMP check.  2. Type 2 diabetes mellitus with other specified complication, without long-term current use of insulin  (HCC) Comments: Chronic, currently on metformin. Will consider use of Farxiga.  - Hemoglobin A1c - BMP8+EGFR  3. Primary insomnia Comments: He is advised to try 2 ounces of tart cherry juice nightly. May also benefit from Mg glycinate. Also reminded to create a bedtime routine.   4. 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 as per Cardiology. This appears to have resolved.   5. Stable angina (HCC) Comments: Chronic, sx are currently stable. Encouraged to follow a heart healthy lifestyle.   6. Class 1 obesity due to excess calories with serious comorbidity and body mass index (BMI)  of 32.0 to 32.9 in adult Comments: He is encouraged to aim for at least 150 minutes of exercise/week, while initially striving for BMI<30 to decrease cardiac risk.   7. Immunization due - Flu Vaccine QUAD High Dose(Fluad)   Patient was given opportunity to ask questions. Patient verbalized understanding of the plan and was able to repeat key elements of the plan. All questions were answered to their satisfaction.   I, Maximino Greenland, MD, have reviewed all documentation for this visit. The documentation on 01/13/22 for the exam, diagnosis, procedures, and orders are all accurate and complete.   IF YOU HAVE BEEN REFERRED TO A SPECIALIST, IT MAY TAKE 1-2 WEEKS TO SCHEDULE/PROCESS THE REFERRAL. IF YOU HAVE NOT HEARD FROM US/SPECIALIST IN TWO WEEKS, PLEASE GIVE [place] A CALL AT [phone].   THE PATIENT IS ENCOURAGED TO PRACTICE SOCIAL DISTANCING DUE TO THE COVID-19 PANDEMIC.

## 2022-01-14 LAB — HEMOGLOBIN A1C
Est. average glucose Bld gHb Est-mCnc: 117 mg/dL
Hgb A1c MFr Bld: 5.7 % — ABNORMAL HIGH (ref 4.8–5.6)

## 2022-01-14 LAB — BMP8+EGFR
BUN/Creatinine Ratio: 16 (ref 10–24)
BUN: 20 mg/dL (ref 8–27)
CO2: 25 mmol/L (ref 20–29)
Calcium: 9.1 mg/dL (ref 8.6–10.2)
Chloride: 103 mmol/L (ref 96–106)
Creatinine, Ser: 1.25 mg/dL (ref 0.76–1.27)
Glucose: 102 mg/dL — ABNORMAL HIGH (ref 70–99)
Potassium: 5.1 mmol/L (ref 3.5–5.2)
Sodium: 138 mmol/L (ref 134–144)
eGFR: 64 mL/min/{1.73_m2} (ref >59)

## 2022-01-18 DIAGNOSIS — E119 Type 2 diabetes mellitus without complications: Secondary | ICD-10-CM | POA: Insufficient documentation

## 2022-01-18 DIAGNOSIS — F5101 Primary insomnia: Secondary | ICD-10-CM | POA: Insufficient documentation

## 2022-01-18 DIAGNOSIS — I119 Hypertensive heart disease without heart failure: Secondary | ICD-10-CM | POA: Insufficient documentation

## 2022-01-28 ENCOUNTER — Ambulatory Visit: Payer: PPO | Admitting: Podiatry

## 2022-02-03 ENCOUNTER — Ambulatory Visit: Payer: PPO

## 2022-02-03 VITALS — BP 132/74 | HR 70 | Temp 98.6°F

## 2022-02-03 DIAGNOSIS — I119 Hypertensive heart disease without heart failure: Secondary | ICD-10-CM

## 2022-02-03 DIAGNOSIS — Z79899 Other long term (current) drug therapy: Secondary | ICD-10-CM

## 2022-02-03 MED ORDER — LOSARTAN POTASSIUM 50 MG PO TABS: 50.0000 mg | ORAL_TABLET | Freq: Every day | ORAL | 11 refills | Status: DC

## 2022-02-03 NOTE — Progress Notes (Signed)
Patient presents today for BPC. He is currently taking amlodipine 10mg  losartan 25 and metoprolol 25mg .   BP Readings from Last 3 Encounters:  02/03/22 (!) 140/80  01/13/22 (!) 150/80  09/30/21 138/80   Patient advised to increase losartan to 50mg . He will return in two weeks for [facility] and labs. Pt states that he will not increase medication but he will return in 2 weeks.

## 2022-02-03 NOTE — Patient Instructions (Signed)

## 2022-02-09 IMAGING — DX DG HAND COMPLETE 3+V*R*
3 series · 3 of 3 positions shown · non-contrast
Comparison: None.

CLINICAL DATA: 63-year-old male with right hand pain and swelling.
No known injury.

EXAM:
RIGHT HAND - COMPLETE 3+ VIEW

[hand pa]
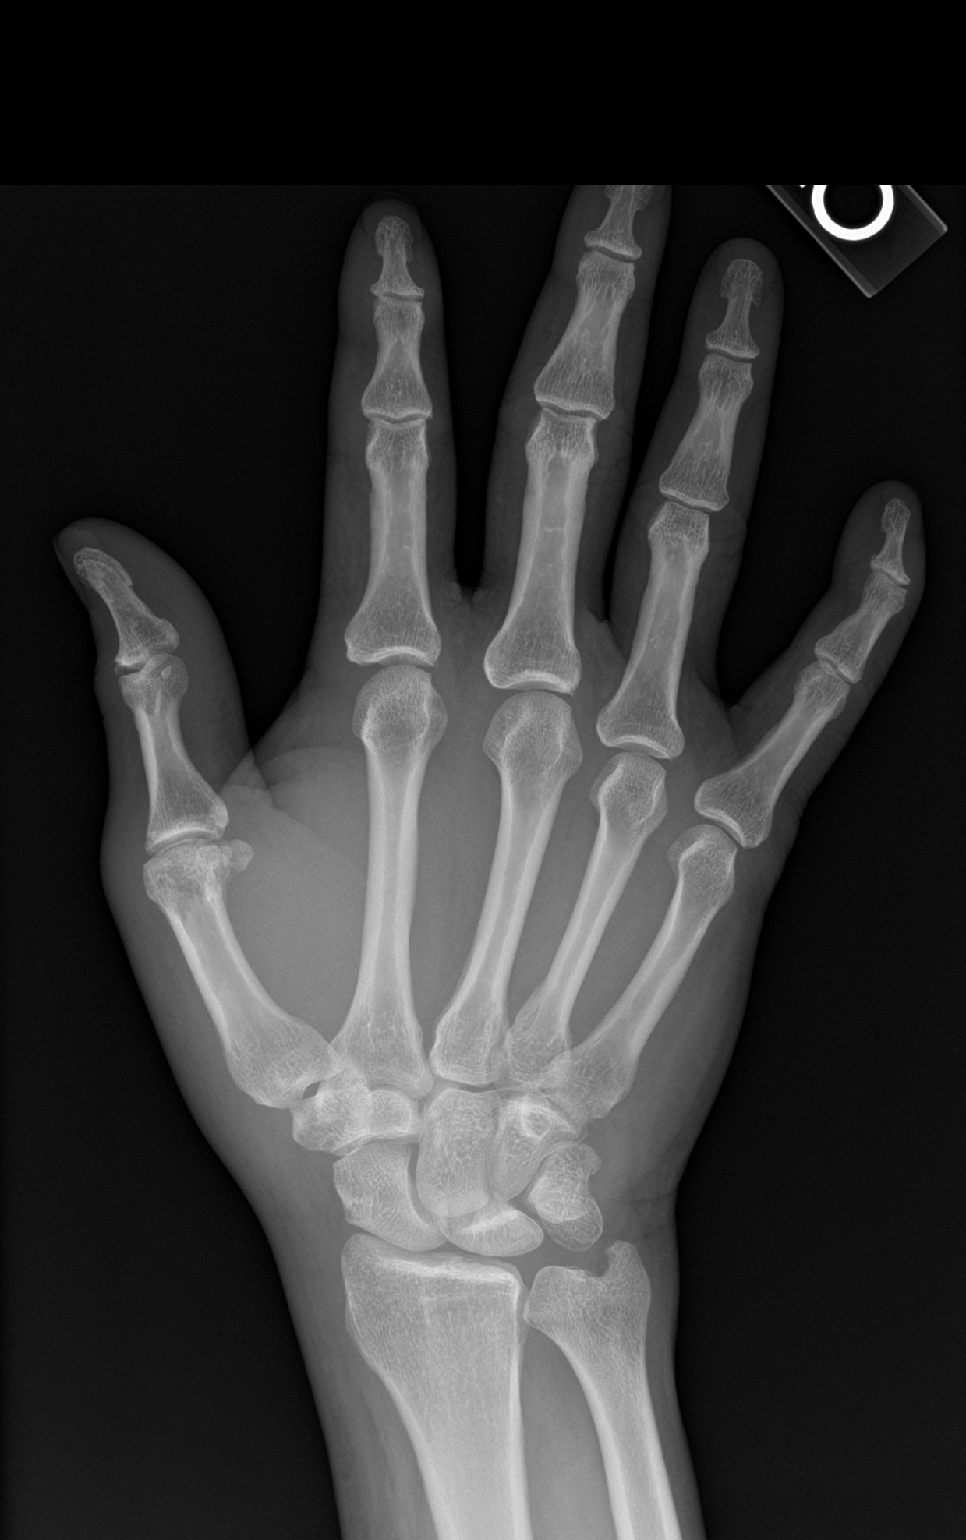

[hand obl]
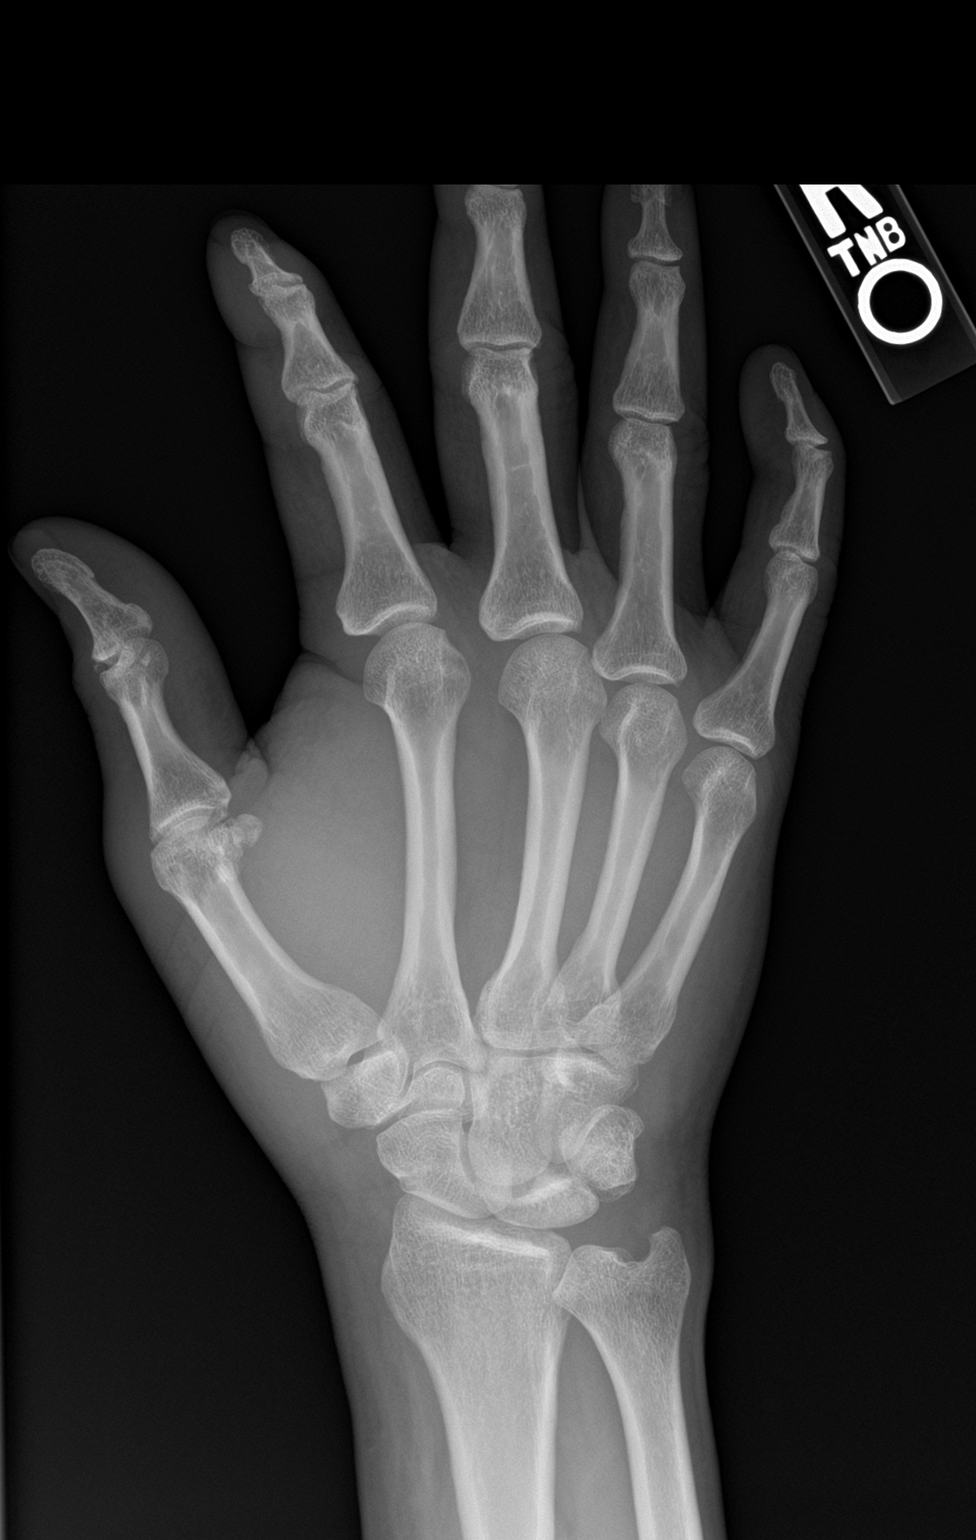

[hand lat]
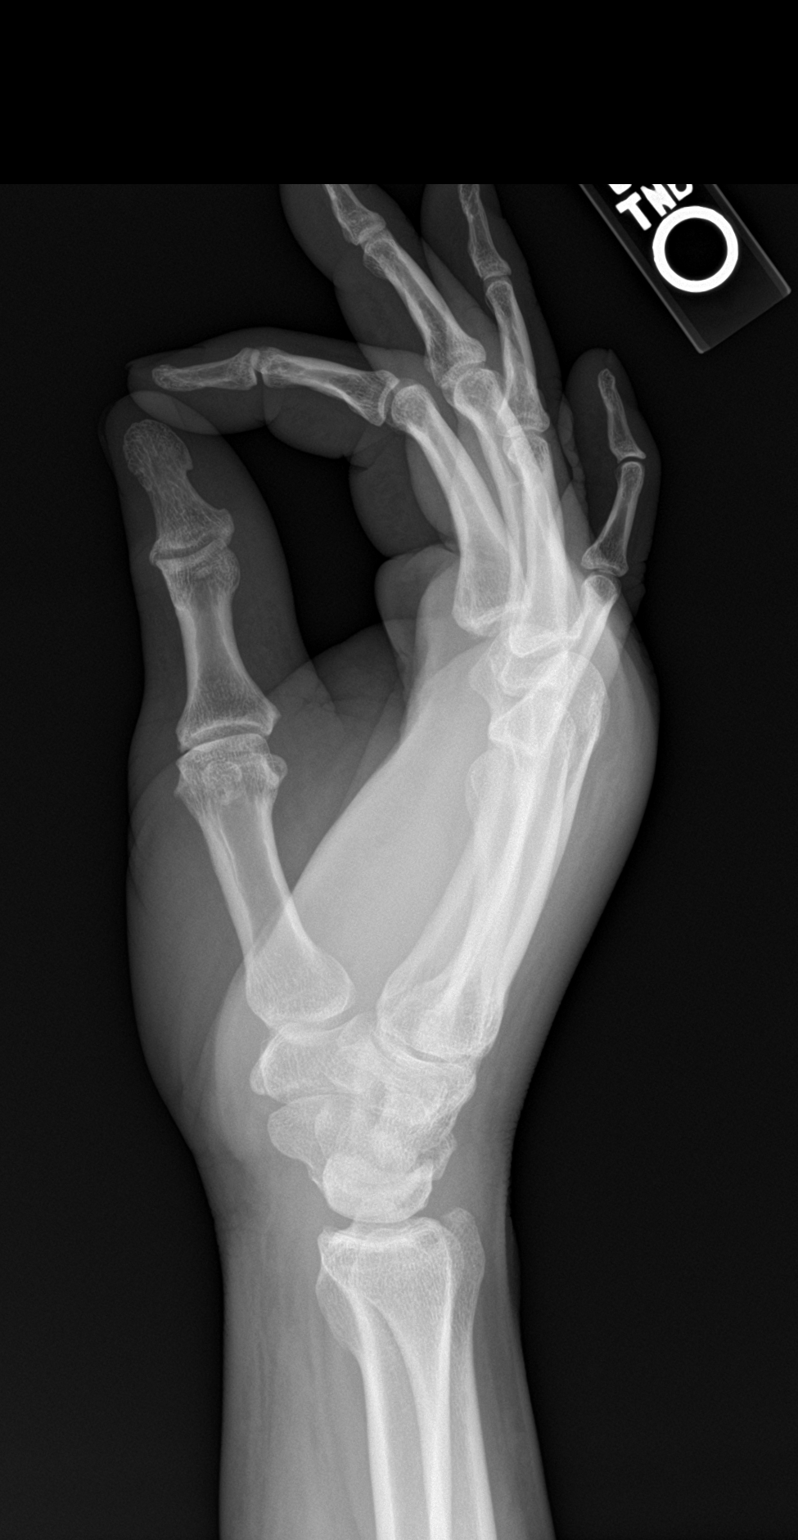

[3 of 3 positions shown; findings below may reference images not displayed]

FINDINGS: There is no acute fracture or dislocation. The bones are well
mineralized. No arthritic changes. There is soft tissue swelling of
the hand. No radiopaque foreign object or soft tissue gas.
IMPRESSION: 1. No acute osseous pathology.
2. Soft tissue swelling of the hand may represent cellulitis.
Clinical correlation is recommended.

## 2022-02-17 ENCOUNTER — Ambulatory Visit: Payer: PPO

## 2022-02-17 ENCOUNTER — Other Ambulatory Visit: Payer: Self-pay

## 2022-02-17 ENCOUNTER — Ambulatory Visit: Payer: PPO | Admitting: Cardiology

## 2022-02-17 ENCOUNTER — Encounter: Payer: Self-pay | Admitting: Cardiology

## 2022-02-17 VITALS — BP 145/80 | HR 68 | Temp 98.2°F | Resp 16 | Ht 63.0 in | Wt 189.2 lb

## 2022-02-17 VITALS — BP 136/78 | HR 67 | Temp 99.3°F

## 2022-02-17 DIAGNOSIS — E119 Type 2 diabetes mellitus without complications: Secondary | ICD-10-CM | POA: Diagnosis not present

## 2022-02-17 DIAGNOSIS — I10 Essential (primary) hypertension: Secondary | ICD-10-CM

## 2022-02-17 DIAGNOSIS — E782 Mixed hyperlipidemia: Secondary | ICD-10-CM | POA: Diagnosis not present

## 2022-02-17 DIAGNOSIS — Z8616 Personal history of COVID-19: Secondary | ICD-10-CM

## 2022-02-17 DIAGNOSIS — Z79899 Other long term (current) drug therapy: Secondary | ICD-10-CM

## 2022-02-17 DIAGNOSIS — I2510 Atherosclerotic heart disease of native coronary artery without angina pectoris: Secondary | ICD-10-CM | POA: Diagnosis not present

## 2022-02-17 DIAGNOSIS — I119 Hypertensive heart disease without heart failure: Secondary | ICD-10-CM | POA: Diagnosis not present

## 2022-02-17 DIAGNOSIS — Z955 Presence of coronary angioplasty implant and graft: Secondary | ICD-10-CM

## 2022-02-17 DIAGNOSIS — Z87891 Personal history of nicotine dependence: Secondary | ICD-10-CM

## 2022-02-17 DIAGNOSIS — Z8639 Personal history of other endocrine, nutritional and metabolic disease: Secondary | ICD-10-CM

## 2022-02-17 MED ORDER — VALSARTAN 160 MG PO TABS: 160.0000 mg | ORAL_TABLET | Freq: Every day | ORAL | 2 refills | Status: DC

## 2022-02-17 NOTE — Patient Instructions (Signed)
Hypertension, Adult High blood pressure (hypertension) is when the force of blood pumping through the arteries is too strong. The arteries are the blood vessels that carry blood from the heart throughout the body. Hypertension forces the heart to work harder to pump blood and may cause arteries to become narrow or stiff. Untreated or uncontrolled hypertension can lead to a heart attack, heart failure, a stroke, kidney disease, and other problems. A blood pressure reading consists of a higher number over a lower number. Ideally, your blood pressure should be below 120/80. The first ("top") number is called the systolic pressure. It is a measure of the pressure in your arteries as your heart beats. The second ("bottom") number is called the diastolic pressure. It is a measure of the pressure in your arteries as the heart relaxes. What are the causes? The exact cause of this condition is not known. There are some conditions that result in high blood pressure. What increases the risk? Certain factors may make you more likely to develop high blood pressure. Some of these risk factors are under your control, including: Smoking. Not getting enough exercise or physical activity. Being overweight. Having too much fat, sugar, calories, or salt (sodium) in your diet. Drinking too much alcohol. Other risk factors include: Having a personal history of heart disease, diabetes, high cholesterol, or kidney disease. Stress. Having a family history of high blood pressure and high cholesterol. Having obstructive sleep apnea. Age. The risk increases with age. What are the signs or symptoms? High blood pressure may not cause symptoms. Very high blood pressure (hypertensive crisis) may cause: Headache. Fast or irregular heartbeats (palpitations). Shortness of breath. Nosebleed. Nausea and vomiting. Vision changes. Severe chest pain, dizziness, and seizures. How is this diagnosed? This condition is diagnosed by  measuring your blood pressure while you are seated, with your arm resting on a flat surface, your legs uncrossed, and your feet flat on the floor. The cuff of the blood pressure monitor will be placed directly against the skin of your upper arm at the level of your heart. Blood pressure should be measured at least twice using the same arm. Certain conditions can cause a difference in blood pressure between your right and left arms. If you have a high blood pressure reading during one visit or you have normal blood pressure with other risk factors, you may be asked to: Return on a different day to have your blood pressure checked again. Monitor your blood pressure at home for 1 week or longer. If you are diagnosed with hypertension, you may have other blood or imaging tests to help your health care provider understand your overall risk for other conditions. How is this treated? This condition is treated by making healthy lifestyle changes, such as eating healthy foods, exercising more, and reducing your alcohol intake. You may be referred for counseling on a healthy diet and physical activity. Your health care provider may prescribe medicine if lifestyle changes are not enough to get your blood pressure under control and if: Your systolic blood pressure is above 130. Your diastolic blood pressure is above 80. Your personal target blood pressure may vary depending on your medical conditions, your age, and other factors. Follow these instructions at home: Eating and drinking  Eat a diet that is high in fiber and potassium, and low in sodium, added sugar, and fat. An example of this eating plan is called the DASH diet. DASH stands for Dietary Approaches to Stop Hypertension. To eat this way: Eat   plenty of fresh fruits and vegetables. Try to fill one half of your plate at each meal with fruits and vegetables. Eat whole grains, such as whole-wheat pasta, brown rice, or whole-grain bread. Fill about one  fourth of your plate with whole grains. Eat or drink low-fat dairy products, such as skim milk or low-fat yogurt. Avoid fatty cuts of meat, processed or cured meats, and poultry with skin. Fill about one fourth of your plate with lean proteins, such as fish, chicken without skin, beans, eggs, or tofu. Avoid pre-made and processed foods. These tend to be higher in sodium, added sugar, and fat. Reduce your daily sodium intake. Many people with hypertension should eat less than 1,500 mg of sodium a day. Do not drink alcohol if: Your health care provider tells you not to drink. You are pregnant, may be pregnant, or are planning to become pregnant. If you drink alcohol: Limit how much you have to: 0-1 drink a day for women. 0-2 drinks a day for men. Know how much alcohol is in your drink. In the [place] one drink equals one 12 oz bottle of beer (355 mL), one 5 oz glass of wine (148 mL), or one 1 oz glass of hard liquor (44 mL). Lifestyle  Work with your health care provider to maintain a healthy body weight or to lose weight. Ask what an ideal weight is for you. Get at least 30 minutes of exercise that causes your heart to beat faster (aerobic exercise) most days of the week. Activities may include walking, swimming, or biking. Include exercise to strengthen your muscles (resistance exercise), such as Pilates or lifting weights, as part of your weekly exercise routine. Try to do these types of exercises for 30 minutes at least 3 days a week. Do not use any products that contain nicotine or tobacco. These products include cigarettes, chewing tobacco, and vaping devices, such as e-cigarettes. If you need help quitting, ask your health care provider. Monitor your blood pressure at home as told by your health care provider. Keep all follow-up visits. This is important. Medicines Take over-the-counter and prescription medicines only as told by your health care provider. Follow directions carefully. Blood  pressure medicines must be taken as prescribed. Do not skip doses of blood pressure medicine. Doing this puts you at risk for problems and can make the medicine less effective. Ask your health care provider about side effects or reactions to medicines that you should watch for. Contact a health care provider if you: Think you are having a reaction to a medicine you are taking. Have headaches that keep coming back (recurring). Feel dizzy. Have swelling in your ankles. Have trouble with your vision. Get help right away if you: Develop a severe headache or confusion. Have unusual weakness or numbness. Feel faint. Have severe pain in your chest or abdomen. Vomit repeatedly. Have trouble breathing. These symptoms may be an emergency. Get help right away. Call 911. Do not wait to see if the symptoms will go away. Do not drive yourself to the hospital. Summary Hypertension is when the force of blood pumping through your arteries is too strong. If this condition is not controlled, it may put you at risk for serious complications. Your personal target blood pressure may vary depending on your medical conditions, your age, and other factors. For most people, a normal blood pressure is less than 120/80. Hypertension is treated with lifestyle changes, medicines, or a combination of both. Lifestyle changes include losing weight, eating a healthy,   low-sodium diet, exercising more, and limiting alcohol. This information is not intended to replace advice given to you by your health care provider. Make sure you discuss any questions you have with your health care provider. Document Revised: 02/18/2021 Document Reviewed: 02/18/2021 [employer]vier Patient Education  2023 [employer]  

## 2022-02-17 NOTE — Progress Notes (Signed)
ID:  Thomas Rowland, [place] [birth year], MRN [id number]  PCP:  Dorothyann Peng, MD  Cardiologist: Tessa Lerner, DO, FACC(established care 10/13/2019) Former Cardiology Providers: Dr. Yates Decamp and Dr. Jordan Hawks.   Date: 02/17/22 Last Office Visit: 08/18/2021  Chief Complaint  Patient presents with   Coronary Artery Disease   Follow-up    HPI  Thomas Rowland is a 66 y.o. 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  In March 2020 while he was seeing Dr. Kennon Rounds he had an abnormal nuclear stress test and underwent elective angiography and was found to have obstructive CAD.  He has had coronary interventions to both the RCA (2 PCI) and the LCx (1 PCI).  He reestablished care with our practice in June 2021. Given his underlying CAD, and EKG findings he underwent an elective myocardial perfusion study to evaluate for reversible ischemia.  The stress test in May 2022 was reported to be high risk due to reduced LVEF per gated SPECT, dilated LV cavity, and presence of both fixed and small size reversible perfusion defect.  After prolonged discussion with regards to the management of CAD that shared decision was to treat him medically as he was asymptomatic.  He now presents for 66-month follow-up visit.  His functional capacity remains relatively stable.  He denies anginal discomfort or heart failure symptoms.  No hospitalizations or urgent care visits for cardiovascular reasons in the last 6 months.  FUNCTIONAL STATUS: Patient states that his work is labor-intensive, no structured exercise program or daily routine.  ALLERGIES: Allergies  Allergen Reactions   Lisinopril Cough    MEDICATION LIST PRIOR TO  VISIT: Current Meds  Medication Sig   allopurinol (ZYLOPRIM) 100 MG tablet Take 1 tablet (100 mg total) by mouth daily as needed.   amLODipine (NORVASC) 10 MG tablet Take 1 tablet (10 mg total) by mouth daily.   aspirin 81 MG EC tablet Take 1 tablet by mouth daily.   atorvastatin (LIPITOR) 40 MG tablet Take 1 tablet (40 mg total) by mouth daily.   clopidogrel (PLAVIX) 75 MG tablet Take 1 tablet (75 mg total) by mouth daily.   loratadine (CLARITIN) 10 MG tablet Take 10 mg by mouth daily.   MAGNESIUM OXIDE PO Take 400 mg by mouth 2 (two) times daily.   meloxicam (MOBIC) 15 MG tablet Take 1 tablet (15 mg total) by mouth daily.   metFORMIN (GLUCOPHAGE) 500 MG tablet Take 1 tablet (500 mg total) by mouth daily with breakfast.   metoprolol succinate (TOPROL-XL) 25 MG 24 hr tablet TAKE ONE TABLET BY MOUTH EVERY DAY ** Hold if blood pressure number less THAN OR pulse less THAN 60 beats PER minute**   MITIGARE 0.6 MG CAPS Take 1 capsule by mouth 3 (three) times daily as needed.   Multiple Vitamin (ONE-DAILY MULTI VITAMINS PO) Take by mouth.   nitroGLYCERIN (NITROSTAT) 0.4 MG SL tablet Place 1 tablet (0.4 mg total) under the tongue every 5 (five) minutes as needed for chest pain. If you require more than two tablets five minutes apart go to the nearest ER via EMS.   potassium chloride SA (KLOR-CON M) 20 MEQ tablet Take 1 tablet (20 mEq total) by mouth daily.   sildenafil (REVATIO) 20 MG tablet TAKE ONE TABLET BY MOUTH EVERY DAY AS  NEEDED     PAST MEDICAL HISTORY: Past Medical History:  Diagnosis Date   Coronary artery disease    Diabetes mellitus without complication ([place])    History of COVID-19    Hyperlipidemia    Hypertension    Nonspecific abnormal electrocardiogram (ECG) (EKG) 06/26/2013    PAST SURGICAL HISTORY: Past Surgical History:  Procedure Laterality Date   COLONOSCOPY  2014   Normal   CORONARY ANGIOPLASTY WITH STENT PLACEMENT     LEG SURGERY Left 1997   [place]     FAMILY HISTORY: No family history of premature coronary disease or sudden cardiac death.  SOCIAL HISTORY:  The patient  reports that he quit smoking about 20 years ago. His smoking use included cigarettes and e-cigarettes. He started smoking about 53 years ago. He has a 7.50 pack-year smoking history. He has never used smokeless tobacco. He reports current alcohol use. He reports that he does not use drugs.  REVIEW OF SYSTEMS: Review of Systems  Constitutional: Negative for chills and fever.  HENT:  Negative for hoarse voice and nosebleeds.   Eyes:  Negative for discharge, double vision and pain.  Cardiovascular:  Negative for chest pain, claudication, dyspnea on exertion, leg swelling, near-syncope, orthopnea, palpitations, paroxysmal nocturnal dyspnea and syncope.  Respiratory:  Negative for hemoptysis and shortness of breath.   Musculoskeletal:  Negative for muscle cramps and myalgias.  Gastrointestinal:  Negative for abdominal pain, constipation, diarrhea, hematemesis, hematochezia, melena, nausea and vomiting.  Neurological:  Negative for dizziness and light-headedness.    PHYSICAL EXAM:    02/17/2022    2:39 PM 02/17/2022   12:13 PM 02/03/2022    4:34 PM  Vitals with BMI  Height 5\' 3"     Weight 189 lbs 3 oz    BMI 24.09    Systolic 735 329 924  Diastolic 80 78 74  Pulse 68 67    Physical Exam  Constitutional: No distress.  Age appropriate, hemodynamically stable.   Neck: No JVD present.  Cardiovascular: Normal rate, regular rhythm, S1 normal, S2 normal, intact distal pulses and normal pulses. Exam reveals no gallop, no S3 and no S4.  No murmur heard. Pulses:      Dorsalis pedis pulses are 2+ on the right side and 2+ on the left side.       Posterior tibial pulses are 2+ on the right side and 2+ on the left side.  Pulmonary/Chest: Effort normal and breath sounds normal. No stridor. He has no wheezes. He has no rales.  Abdominal: Soft. Bowel sounds are normal. He  exhibits no distension. There is no abdominal tenderness.  Musculoskeletal:        General: No edema.     Cervical back: Neck supple.  Neurological: He is alert and oriented to person, place, and time. He has intact cranial nerves (2-12).  Skin: Skin is warm and moist.   CARDIAC DATABASE: EKG: 02/17/2022: Sinus bradycardia, 59 bpm, T wave inversions in the inferolateral leads consider ischemia.  Similar findings on prior ECG dated 08/18/2021.  Echocardiogram: 10/20/2019: LVEF 50-55%, moderate LVH, indeterminate diastolic filling pattern, mildly dilated left atrium, mild AR, mild MR, mild TR, mild PR.  Stress Testing: Lexiscan Sestamibi stress test 09/11/2020: Small size, severe intensity, fixed perfusion defect consistent with prior infarct in the RCA distribution. Small size, mild intensity, reversible perfusion defect consistent with either reversible ischemia or peri-infarct ischemia in the LCx / RCA distribution. Severely reduced LVEF, calculated LVEF 24%.  Wall motion illustrates Global hypokinesia with  hypokinetic inferior segments.  High risk study (severely reduced LVEF, dilated LV, presence of both fixed and small size perfusion defect). Clinical correlation required.  Compared to prior outside records from 06/15/2019: Noted potential small area of pharmacologically induced ischemia involving the mid aspect of the inferior wall of the left ventricle. LVEF 50%, mild hypokinesis involving the inferior wall.   Heart Catheterization: 07/15/2018 LHC with Dr. Theron Arista at [facility] (care everywhere): "Angiography revealed mild LAD, 80% mLCx, 95% mRCA. Stented RCA with 3.5x18 and 3.5x12 Onyx DES with good angiographic result. Stented LCX with a 3.5x15 Onyx DES with good angiographic result."  LABORATORY DATA:    Latest Ref Rng & Units 09/03/2021    4:49 PM 10/29/2020    4:42 PM 08/13/2020   11:55 AM  CBC  WBC 3.4 - 10.8 x10E3/uL 8.1   9.0   Hemoglobin 13.0 - 17.7 g/dL 81.8  56.3  14.9    Hematocrit 37.5 - 51.0 % 39.4  41.2  44.7   Platelets 150 - 450 x10E3/uL 179   160        Latest Ref Rng & Units 01/13/2022    5:18 PM 09/03/2021    4:49 PM 04/02/2021    4:43 PM  CMP  Glucose 70 - 99 mg/dL 702  87  87   BUN 8 - 27 mg/dL 20  19  16    Creatinine 0.76 - 1.27 mg/dL  6.37  8.58   Sodium 134 - 144 mmol/L 138  143  144   Potassium 3.5 - 5.2 mmol/L 5.1  4.4  4.6   Chloride 96 - 106 mmol/L 103  102  103   CO2 20 - 29 mmol/L 25  27  26    Calcium 8.6 - 10.2 mg/dL 9.1  9.3  9.4   Total Protein 6.0 - 8.5 g/dL  6.7  7.1   Total Bilirubin 0.0 - 1.2 mg/dL  0.2  0.3   Alkaline Phos 44 - 121 IU/L  74  80   AST 0 - 40 IU/L  24  30   ALT 0 - 44 IU/L  19  25     Lipid Panel     Component Value Date/Time   CHOL 133 09/03/2021 1656   TRIG 77 09/03/2021 1656   HDL 51 09/03/2021 1656   CHOLHDL 2.6 09/03/2021 1656   LDLCALC 67 09/03/2021 1656   LABVLDL 15 09/03/2021 1656    No components found for: "NTPROBNP" No results for input(s): "PROBNP" in the last 8760 hours. No results for input(s): "TSH" in the last 8760 hours.   HEMOGLOBIN A1C Lab Results  Component Value Date   HGBA1C 5.7 (H) 01/13/2022    IMPRESSION:    ICD-10-CM   1. Atherosclerosis of native coronary artery of native heart without angina pectoris  I25.10 EKG 12-Lead    2. History of coronary angioplasty with insertion of stent  Z95.5     3. Essential hypertension  I10     4. Mixed hyperlipidemia  E78.2     5. Non-insulin dependent type 2 diabetes mellitus (HCC)  E11.9     6. Hx of hyperaldosteronism  Z86.39     7. Former smoker  Z87.891     8. History of COVID-19  Z86.16        RECOMMENDATIONS: Dywane A Rowland is a 66 y.o. male whose past medical history and cardiac risk factors include: Coronary artery disease status post stents , non-insulin-dependent diabetes mellitus type 2, hypertension secondary to adrenal  adenoma, hyperaldosteronism, erectile dysfunction, hyperlipidemia, and  kidney disease, former smoker.  Atherosclerosis of native coronary artery of native heart without angina pectoris / History of coronary angioplasty with insertion of stent / Abnormal nuclear stress test: Denies angina pectoris. No use of sublingual nitroglycerin tablets since last office visit. EKG: Sinus rhythm with underlying TWI in inferolateral leads-no significant change compared to prior tracing. Echo 09/2019: Preserved LVEF, indeterminate diastolic filling pattern, mild valvular heart disease. MPI 08/2020: High risk due to dilated LV cavity, reduced LVEF by gated SPECT, and presence of both fixed and small size reversible perfusion defect.  Patient chose to proceed with lifestyle changes and monitor his disease progression clinically.  He remains asymptomatic and would like to hold off additional testing at this time.  Quite appropriate. However, he is educated on seeking medical attention sooner by going to the closest ER via EMS if the symptoms increase in intensity, frequency, duration, or has typical chest pain as discussed in the office.  Patient verbalized understanding.  Essential hypertension Office BP are w/n acceptable range.  However, he informs me that his PCP took him off of losartan recently. I see valsartan on the medication reconciliation;however, no physical bottle for valsartan.  To avoid confusion and language barrier will defer BP management to his PCP for now.   Mixed hyperlipidemia Currently on atorvastatin.   He denies myalgia or other side effects. Most recent lipids from May 2023, independently reviewed as noted above.  Non-insulin dependent type 2 diabetes mellitus (HCC) A1c well managed. Currently on statin therapy. Recommend ARB.  FINAL MEDICATION LIST END OF ENCOUNTER: No orders of the defined types were placed in this encounter.    Current Outpatient Medications:    allopurinol (ZYLOPRIM) 100 MG tablet, Take 1 tablet (100 mg total) by mouth daily as  needed., Disp: 90 tablet, Rfl: 1   amLODipine (NORVASC) 10 MG tablet, Take 1 tablet (10 mg total) by mouth daily., Disp: 90 tablet, Rfl: 2   aspirin 81 MG EC tablet, Take 1 tablet by mouth daily., Disp: , Rfl:    atorvastatin (LIPITOR) 40 MG tablet, Take 1 tablet (40 mg total) by mouth daily., Disp: 90 tablet, Rfl: 2   clopidogrel (PLAVIX) 75 MG tablet, Take 1 tablet (75 mg total) by mouth daily., Disp: 90 tablet, Rfl: 3   loratadine (CLARITIN) 10 MG tablet, Take 10 mg by mouth daily., Disp: , Rfl:    MAGNESIUM OXIDE PO, Take 400 mg by mouth 2 (two) times daily., Disp: , Rfl:    meloxicam (MOBIC) 15 MG tablet, Take 1 tablet (15 mg total) by mouth daily., Disp: 30 tablet, Rfl: 0   metFORMIN (GLUCOPHAGE) 500 MG tablet, Take 1 tablet (500 mg total) by mouth daily with breakfast., Disp: 90 tablet, Rfl: 2   metoprolol succinate (TOPROL-XL) 25 MG 24 hr tablet, TAKE ONE TABLET BY MOUTH EVERY DAY ** Hold if blood pressure number less THAN 100mmhg OR pulse less THAN 60 beats PER minute**, Disp: 90 tablet, Rfl: 0   MITIGARE 0.6 MG CAPS, Take 1 capsule by mouth 3 (three) times daily as needed., Disp: 30 capsule, Rfl: 2   Multiple Vitamin (ONE-DAILY MULTI VITAMINS PO), Take by mouth., Disp: , Rfl:    nitroGLYCERIN (NITROSTAT) 0.4 MG SL tablet, Place 1 tablet (0.4 mg total) under the tongue every 5 (five) minutes as needed for chest pain. If you require more than two tablets five minutes apart go to the nearest ER via EMS., Disp: 30 tablet, Rfl: 0  potassium chloride SA (KLOR-CON M) 20 MEQ tablet, Take 1 tablet (20 mEq total) by mouth daily., Disp: 90 tablet, Rfl: 1   sildenafil (REVATIO) 20 MG tablet, TAKE ONE TABLET BY MOUTH EVERY DAY AS NEEDED, Disp: 10 tablet, Rfl: 1   Cholecalciferol (VITAMIN D3) 25 MCG (1000 UT) CAPS, Take 1,000 Units by mouth daily. (Patient not taking: Reported on 02/17/2022), Disp: , Rfl:    valsartan (DIOVAN) 160 MG tablet, Take 1 tablet (160 mg total) by mouth daily. (Patient not  taking: Reported on 02/17/2022), Disp: 30 tablet, Rfl: 2  Orders Placed This Encounter  Procedures   EKG 12-Lead   There are no Patient Instructions on file for this visit.   --Continue cardiac medications as reconciled in final medication list. --Return in about 6 months (around 08/19/2022) for Follow up, CAD. Or sooner if needed. --Continue follow-up with your primary care physician regarding the management of your other chronic comorbid conditions.  Patient's questions and concerns were addressed to his satisfaction. He voices understanding of the instructions provided during this encounter.   This note was created using a voice recognition software as a result there may be grammatical errors inadvertently enclosed that do not reflect the nature of this encounter. Every attempt is made to correct such errors.  Tessa Lerner, [place], [facility]  Pager: [phone] Office: [phone]

## 2022-02-17 NOTE — Progress Notes (Signed)
Patient presents today for BPC. He is currently taking amlodipine 10mg  metoprolol 25mg  and losartan 50mg . He also takes magnesium.  BP Readings from Last 3 Encounters:  02/17/22 136/78  02/03/22 132/74  01/13/22 (!) 150/80   Provider would like to change losartan 50mg  to valsartan 160mg . Patient will return in 2 weeks for BPC and repeat BMP.

## 2022-02-18 ENCOUNTER — Encounter: Payer: Self-pay | Admitting: Internal Medicine

## 2022-02-18 ENCOUNTER — Ambulatory Visit: Payer: PPO | Admitting: Podiatry

## 2022-02-18 DIAGNOSIS — L84 Corns and callosities: Secondary | ICD-10-CM

## 2022-02-18 DIAGNOSIS — L988 Other specified disorders of the skin and subcutaneous tissue: Secondary | ICD-10-CM

## 2022-02-18 LAB — BMP8+EGFR
BUN/Creatinine Ratio: 16 (ref 10–24)
BUN: 20 mg/dL (ref 8–27)
CO2: 23 mmol/L (ref 20–29)
Calcium: 8.5 mg/dL — ABNORMAL LOW (ref 8.6–10.2)
Chloride: 100 mmol/L (ref 96–106)
Creatinine, Ser: 1.29 mg/dL — ABNORMAL HIGH (ref 0.76–1.27)
Glucose: 143 mg/dL — ABNORMAL HIGH (ref 70–99)
Potassium: 4.8 mmol/L (ref 3.5–5.2)
Sodium: 138 mmol/L (ref 134–144)
eGFR: 61 mL/min/{1.73_m2} (ref >59)

## 2022-02-18 MED ORDER — TERBINAFINE HCL 250 MG PO TABS: 250.0000 mg | ORAL_TABLET | Freq: Every day | ORAL | 0 refills | Status: AC

## 2022-02-18 MED ORDER — DOXYCYCLINE HYCLATE 100 MG PO TABS: 100.0000 mg | ORAL_TABLET | Freq: Two times a day (BID) | ORAL | 0 refills | Status: AC

## 2022-02-18 MED ORDER — ERYTHROMYCIN 2 % EX PADS: 60.0000 | MEDICATED_PAD | CUTANEOUS | 2 refills | Status: AC

## 2022-02-18 NOTE — Progress Notes (Signed)
Subjective:  Patient ID: Thomas Rowland, male    DOB: February 23, 1956,  MRN: [id number]  Chief Complaint  Patient presents with   Callouses    66 y.o. male presents with the above complaint.  P patient presents for follow-up of right third digit and fourth digit heloma molle including now to the left fourth and fifth digit.  He states that the Betadine as well as erythromycin pads were keeping at bay.  He denies any other acute complaints.  He would like to discuss next treatment plan.   Review of Systems: Negative except as noted in the HPI. Denies N/V/F/Ch.  Past Medical History:  Diagnosis Date   Coronary artery disease    Diabetes mellitus without complication (HCC)    History of COVID-19    Hyperlipidemia    Hypertension    Nonspecific abnormal electrocardiogram (ECG) (EKG) 06/26/2013    Current Outpatient Medications:    doxycycline (VIBRA-TABS) 100 MG tablet, Take 1 tablet (100 mg total) by mouth 2 (two) times daily for 14 days., Disp: 28 tablet, Rfl: 0   terbinafine (LAMISIL) 250 MG tablet, Take 1 tablet (250 mg total) by mouth daily., Disp: 30 tablet, Rfl: 0   allopurinol (ZYLOPRIM) 100 MG tablet, Take 1 tablet (100 mg total) by mouth daily as needed., Disp: 90 tablet, Rfl: 1   amLODipine (NORVASC) 10 MG tablet, Take 1 tablet (10 mg total) by mouth daily., Disp: 90 tablet, Rfl: 2   aspirin 81 MG EC tablet, Take 1 tablet by mouth daily., Disp: , Rfl:    atorvastatin (LIPITOR) 40 MG tablet, Take 1 tablet (40 mg total) by mouth daily., Disp: 90 tablet, Rfl: 2   Cholecalciferol (VITAMIN D3) 25 MCG (1000 UT) CAPS, Take 1,000 Units by mouth daily. (Patient not taking: Reported on 02/17/2022), Disp: , Rfl:    clopidogrel (PLAVIX) 75 MG tablet, Take 1 tablet (75 mg total) by mouth daily., Disp: 90 tablet, Rfl: 3   loratadine (CLARITIN) 10 MG tablet, Take 10 mg by mouth daily., Disp: , Rfl:    MAGNESIUM OXIDE PO, Take 400 mg by mouth 2 (two) times daily., Disp: , Rfl:    meloxicam  (MOBIC) 15 MG tablet, Take 1 tablet (15 mg total) by mouth daily., Disp: 30 tablet, Rfl: 0   metFORMIN (GLUCOPHAGE) 500 MG tablet, Take 1 tablet (500 mg total) by mouth daily with breakfast., Disp: 90 tablet, Rfl: 2   metoprolol succinate (TOPROL-XL) 25 MG 24 hr tablet, TAKE ONE TABLET BY MOUTH EVERY DAY ** Hold if blood pressure number less THAN OR pulse less THAN 60 beats PER minute**, Disp: 90 tablet, Rfl: 0   MITIGARE 0.6 MG CAPS, Take 1 capsule by mouth 3 (three) times daily as needed., Disp: 30 capsule, Rfl: 2   Multiple Vitamin (ONE-DAILY MULTI VITAMINS PO), Take by mouth., Disp: , Rfl:    nitroGLYCERIN (NITROSTAT) 0.4 MG SL tablet, Place 1 tablet (0.4 mg total) under the tongue every 5 (five) minutes as needed for chest pain. If you require more than two tablets five minutes apart go to the nearest ER via EMS., Disp: 30 tablet, Rfl: 0   potassium chloride SA (KLOR-CON M) 20 MEQ tablet, Take 1 tablet (20 mEq total) by mouth daily., Disp: 90 tablet, Rfl: 1   sildenafil (REVATIO) 20 MG tablet, TAKE ONE TABLET BY MOUTH EVERY DAY AS NEEDED, Disp: 10 tablet, Rfl: 1   valsartan (DIOVAN) 160 MG tablet, Take 1 tablet (160 mg total) by mouth daily. (  Patient not taking: Reported on 02/17/2022), Disp: 30 tablet, Rfl: 2  Social History   Tobacco Use  Smoking Status Former   Packs/day: 0.25   Years: 30.00   Total pack years: 7.50   Types: Cigarettes, E-cigarettes   Start date: 1970   Quit date: 06/26/2001   Years since quitting: 20.6  Smokeless Tobacco Never    Allergies  Allergen Reactions   Lisinopril Cough   Objective:  There were no vitals filed for this visit. There is no height or weight on file to calculate BMI. Constitutional Well developed. Well nourished.  Vascular Dorsalis pedis pulses palpable bilaterally. Posterior tibial pulses palpable bilaterally. Capillary refill normal to all digits.  No cyanosis or clubbing noted. Pedal hair growth normal.  Neurologic Normal  speech. Oriented to person, place, and time. Epicritic sensation to light touch grossly present bilaterally.  Dermatologic Nails within normal limits Skin heloma molle noted to right fourth and fifth digit with interdigital maceration.  Mild pain on palpation.  Orthopedic: Normal joint ROM without pain or crepitus bilaterally. No visible deformities. No bony tenderness.   Radiographs: None Assessment:   1. Skin maceration   2. Heloma molle     Plan:  Patient was evaluated and treated and all questions answered.  Bilateral fourth and fifth digit heloma molle with underlying maceration -All questions and concerns were discussed with the patient in extensive detail.  Given the amount of maceration still present patient will benefit from 14 days of doxycycline 30 days of Lamisil and erythromycin pads were dispensed in extensive detail I discussed with the patient that she will need to take the medication daily.  Patient states understanding.  No follow-ups on file. Right fourth and fifth heloma molle Betadine wet-to-dry and shoe gear modification 14 Doxy for 14 days Lamisil for 30 days and erythromycin pads were dispensed.  Hold off on Betadine wet-to-dry

## 2022-03-03 ENCOUNTER — Ambulatory Visit: Payer: PPO

## 2022-03-03 VITALS — BP 138/72 | HR 78 | Temp 98.9°F

## 2022-03-03 DIAGNOSIS — Z79899 Other long term (current) drug therapy: Secondary | ICD-10-CM

## 2022-03-03 NOTE — Patient Instructions (Signed)

## 2022-03-03 NOTE — Progress Notes (Signed)
Patient presents today for BPC. He is currently taking amlodipine 10mg  metoprolol 25mg  and Valsartan 160mg . He also takes magnesium.   BP Readings from Last 3 Encounters:  03/03/22 138/72  02/17/22 (!) 145/80  02/17/22 136/78    Patient will follow up as scheduled with provider. He will go to the lab today for BMP.

## 2022-03-04 LAB — BMP8+EGFR
BUN/Creatinine Ratio: 15 (ref 10–24)
BUN: 19 mg/dL (ref 8–27)
CO2: 24 mmol/L (ref 20–29)
Calcium: 9.1 mg/dL (ref 8.6–10.2)
Chloride: 103 mmol/L (ref 96–106)
Creatinine, Ser: 1.31 mg/dL — ABNORMAL HIGH (ref 0.76–1.27)
Glucose: 117 mg/dL — ABNORMAL HIGH (ref 70–99)
Potassium: 4.3 mmol/L (ref 3.5–5.2)
Sodium: 140 mmol/L (ref 134–144)
eGFR: 60 mL/min/{1.73_m2} (ref >59)

## 2022-03-11 ENCOUNTER — Encounter: Payer: Self-pay | Admitting: Internal Medicine

## 2022-03-23 ENCOUNTER — Telehealth: Payer: Self-pay

## 2022-03-30 ENCOUNTER — Other Ambulatory Visit: Payer: Self-pay | Admitting: Internal Medicine

## 2022-04-03 ENCOUNTER — Other Ambulatory Visit: Payer: Self-pay | Admitting: Cardiology

## 2022-04-03 DIAGNOSIS — I2510 Atherosclerotic heart disease of native coronary artery without angina pectoris: Secondary | ICD-10-CM

## 2022-04-23 ENCOUNTER — Other Ambulatory Visit: Payer: Self-pay | Admitting: Internal Medicine

## 2022-04-29 ENCOUNTER — Ambulatory Visit (INDEPENDENT_AMBULATORY_CARE_PROVIDER_SITE_OTHER): Payer: PPO | Admitting: Podiatry

## 2022-04-29 DIAGNOSIS — L84 Corns and callosities: Secondary | ICD-10-CM | POA: Diagnosis not present

## 2022-04-29 DIAGNOSIS — L988 Other specified disorders of the skin and subcutaneous tissue: Secondary | ICD-10-CM

## 2022-04-29 NOTE — Progress Notes (Signed)
Subjective:  Patient ID: Thomas Rowland, male    DOB: 03/22/1956,  MRN: [id number]  Chief Complaint  Patient presents with   Callouses    67 y.o. male presents with the above complaint.  P patient presents for follow-up of right third digit and fourth digit heloma molle including now to the left fourth and fifth digit.  He states erythromycin pads have not helped.  Denies any other acute complaints.   Review of Systems: Negative except as noted in the HPI. Denies N/V/F/Ch.  Past Medical History:  Diagnosis Date   Coronary artery disease    Diabetes mellitus without complication ([place])    History of COVID-19    Hyperlipidemia    Hypertension    Nonspecific abnormal electrocardiogram (ECG) (EKG) 06/26/2013    Current Outpatient Medications:    allopurinol (ZYLOPRIM) 100 MG tablet, Take 1 tablet (100 mg total) by mouth daily as needed., Disp: 90 tablet, Rfl: 1   amLODipine (NORVASC) 10 MG tablet, Take 1 tablet (10 mg total) by mouth daily., Disp: 90 tablet, Rfl: 2   aspirin 81 MG EC tablet, Take 1 tablet by mouth daily., Disp: , Rfl:    atorvastatin (LIPITOR) 40 MG tablet, Take 1 tablet (40 mg total) by mouth daily., Disp: 90 tablet, Rfl: 2   Cholecalciferol (VITAMIN D3) 25 MCG (1000 UT) CAPS, Take 1,000 Units by mouth daily. (Patient not taking: Reported on 02/17/2022), Disp: , Rfl:    clopidogrel (PLAVIX) 75 MG tablet, Take 1 tablet (75 mg total) by mouth daily., Disp: 90 tablet, Rfl: 3   loratadine (CLARITIN) 10 MG tablet, Take 10 mg by mouth daily., Disp: , Rfl:    MAGNESIUM OXIDE PO, Take 400 mg by mouth 2 (two) times daily., Disp: , Rfl:    meloxicam (MOBIC) 15 MG tablet, Take 1 tablet (15 mg total) by mouth daily., Disp: 30 tablet, Rfl: 0   metFORMIN (GLUCOPHAGE) 500 MG tablet, Take 1 tablet (500 mg total) by mouth daily with breakfast., Disp: 90 tablet, Rfl: 2   metoprolol succinate (TOPROL-XL) 25 MG 24 hr tablet, TAKE ONE TABLET BY MOUTH EVERY DAY ** Hold if blood pressure  number less THAN 125mmhg OR pulse less THAN 60 beats PER minute**, Disp: 90 tablet, Rfl: 0   MITIGARE 0.6 MG CAPS, Take 1 capsule by mouth 3 (three) times daily as needed., Disp: 30 capsule, Rfl: 2   Multiple Vitamin (ONE-DAILY MULTI VITAMINS PO), Take by mouth., Disp: , Rfl:    nitroGLYCERIN (NITROSTAT) 0.4 MG SL tablet, Place 1 tablet (0.4 mg total) under the tongue every 5 (five) minutes as needed for chest pain. If you require more than two tablets five minutes apart go to the nearest ER via EMS., Disp: 30 tablet, Rfl: 0   potassium chloride SA (KLOR-CON M) 20 MEQ tablet, Take 1 tablet (20 mEq total) by mouth daily., Disp: 90 tablet, Rfl: 1   sildenafil (REVATIO) 20 MG tablet, TAKE ONE TABLET BY MOUTH EVERY DAY AS NEEDED, Disp: 10 tablet, Rfl: 1   valsartan (DIOVAN) 160 MG tablet, Take 1 tablet (160 mg total) by mouth 2 (two) times daily. Take one tablet by mouth before breakfast and dinner., Disp: 90 tablet, Rfl: 1  Social History   Tobacco Use  Smoking Status Former   Packs/day: 0.25   Years: 30.00   Total pack years: 7.50   Types: Cigarettes, E-cigarettes   Start date: 13   Quit date: 06/26/2001   Years since quitting: 20.8  Smokeless  Tobacco Never    Allergies  Allergen Reactions   Lisinopril Cough   Objective:  There were no vitals filed for this visit. There is no height or weight on file to calculate BMI. Constitutional Well developed. Well nourished.  Vascular Dorsalis pedis pulses palpable bilaterally. Posterior tibial pulses palpable bilaterally. Capillary refill normal to all digits.  No cyanosis or clubbing noted. Pedal hair growth normal.  Neurologic Normal speech. Oriented to person, place, and time. Epicritic sensation to light touch grossly present bilaterally.  Dermatologic Nails within normal limits Skin heloma molle noted to right fourth and fifth digit with interdigital maceration.  Mild pain on palpation.  With some improvement  Orthopedic: Normal  joint ROM without pain or crepitus bilaterally. No visible deformities. No bony tenderness.   Radiographs: None Assessment:   No diagnosis found.   Plan:  Patient was evaluated and treated and all questions answered.  Bilateral fourth and fifth digit heloma molle with underlying maceration -All questions and concerns were discussed with the patient in extensive detail.   -Clinically improving.  Toe spacers were dispensed.  I encouraged him to keep it dry between the toes.  He can go back to doing Betadine wet-to-dry dressing if he gets more macerated. -He has failed erythromycin therapy

## 2022-05-18 ENCOUNTER — Telehealth: Payer: Self-pay

## 2022-05-18 ENCOUNTER — Ambulatory Visit (INDEPENDENT_AMBULATORY_CARE_PROVIDER_SITE_OTHER): Payer: Medicare Other | Admitting: Internal Medicine

## 2022-05-18 ENCOUNTER — Encounter: Payer: Self-pay | Admitting: Internal Medicine

## 2022-05-18 VITALS — BP 120/64 | HR 75 | Temp 98.1°F | Ht 63.0 in | Wt 189.6 lb

## 2022-05-18 DIAGNOSIS — E6609 Other obesity due to excess calories: Secondary | ICD-10-CM | POA: Diagnosis not present

## 2022-05-18 DIAGNOSIS — E1169 Type 2 diabetes mellitus with other specified complication: Secondary | ICD-10-CM | POA: Diagnosis not present

## 2022-05-18 DIAGNOSIS — Z6833 Body mass index (BMI) 33.0-33.9, adult: Secondary | ICD-10-CM | POA: Diagnosis not present

## 2022-05-18 DIAGNOSIS — I119 Hypertensive heart disease without heart failure: Secondary | ICD-10-CM

## 2022-05-18 DIAGNOSIS — Z23 Encounter for immunization: Secondary | ICD-10-CM

## 2022-05-18 DIAGNOSIS — F5101 Primary insomnia: Secondary | ICD-10-CM

## 2022-05-18 DIAGNOSIS — D649 Anemia, unspecified: Secondary | ICD-10-CM | POA: Diagnosis not present

## 2022-05-18 MED ORDER — SHINGRIX 50 MCG/0.5ML IM SUSR: 0.5000 mL | Freq: Once | INTRAMUSCULAR | 0 refills | Status: AC

## 2022-05-18 NOTE — Patient Instructions (Signed)

## 2022-05-18 NOTE — Progress Notes (Signed)
I,Thomas Rowland,acting as a scribe for Thomas Greenland, MD.,have documented all relevant documentation on the behalf of Thomas Greenland, MD,as directed by  Thomas Greenland, MD while in the presence of Thomas Greenland, MD.    Subjective:     Patient ID: Thomas Rowland , male    DOB: Dec 02, 1955 , 67 y.o.   MRN: [id number]   Chief Complaint  Patient presents with   Hypertension   Diabetes    HPI  Pt presents for HTN/DM check. He states that he is compliant with medication.  He denies headaches, chest pain and shortness of breath.   Shingrix rejected on [facility].    Hypertension This is a chronic problem. The current episode started more than 1 year ago. The problem has been gradually worsening since onset. The problem is uncontrolled. Pertinent negatives include no anxiety, blurred vision, chest pain, headaches or palpitations. There are no associated agents to hypertension. Risk factors for coronary artery disease include obesity and diabetes mellitus. Past treatments include diuretics. The current treatment provides no improvement. There are no compliance problems.  There is no history of angina.  Diabetes He presents for his follow-up diabetic visit. He has type 2 diabetes mellitus. There are no hypoglycemic associated symptoms. Pertinent negatives for hypoglycemia include no dizziness or headaches. Pertinent negatives for diabetes include no blurred vision, no chest pain, no fatigue, no polydipsia, no polyphagia and no polyuria. Symptoms are stable.     Past Medical History:  Diagnosis Date   Coronary artery disease    Diabetes mellitus without complication ([place])    History of COVID-19    Hyperlipidemia    Hypertension    Nonspecific abnormal electrocardiogram (ECG) (EKG) 06/26/2013     Family History  Family history unknown: Yes     Current Outpatient Medications:    allopurinol (ZYLOPRIM) 100 MG tablet, Take 1 tablet (100 mg total) by mouth daily as needed.,  Disp: 90 tablet, Rfl: 1   amLODipine (NORVASC) 10 MG tablet, Take 1 tablet (10 mg total) by mouth daily., Disp: 90 tablet, Rfl: 2   aspirin 81 MG EC tablet, Take 1 tablet by mouth daily., Disp: , Rfl:    atorvastatin (LIPITOR) 40 MG tablet, Take 1 tablet (40 mg total) by mouth daily., Disp: 90 tablet, Rfl: 2   clopidogrel (PLAVIX) 75 MG tablet, Take 1 tablet (75 mg total) by mouth daily., Disp: 90 tablet, Rfl: 3   loratadine (CLARITIN) 10 MG tablet, Take 10 mg by mouth daily., Disp: , Rfl:    MAGNESIUM OXIDE PO, Take 400 mg by mouth 2 (two) times daily., Disp: , Rfl:    meloxicam (MOBIC) 15 MG tablet, Take 1 tablet (15 mg total) by mouth daily., Disp: 30 tablet, Rfl: 0   metFORMIN (GLUCOPHAGE) 500 MG tablet, Take 1 tablet (500 mg total) by mouth daily with breakfast., Disp: 90 tablet, Rfl: 2   metoprolol succinate (TOPROL-XL) 25 MG 24 hr tablet, TAKE ONE TABLET BY MOUTH EVERY DAY ** Hold if blood pressure number less THAN 151mhg OR pulse less THAN 60 beats PER minute**, Disp: 90 tablet, Rfl: 0   MITIGARE 0.6 MG CAPS, Take 1 capsule by mouth 3 (three) times daily as needed., Disp: 30 capsule, Rfl: 2   Multiple Vitamin (ONE-DAILY MULTI VITAMINS PO), Take by mouth., Disp: , Rfl:    potassium chloride SA (KLOR-CON M) 20 MEQ tablet, Take 1 tablet (20 mEq total) by mouth daily., Disp: 90 tablet, Rfl: 1   sildenafil (  REVATIO) 20 MG tablet, TAKE ONE TABLET BY MOUTH EVERY DAY AS NEEDED, Disp: 10 tablet, Rfl: 1   Cholecalciferol (VITAMIN D3) 25 MCG (1000 UT) CAPS, Take 1,000 Units by mouth daily. (Patient not taking: Reported on 02/17/2022), Disp: , Rfl:    nitroGLYCERIN (NITROSTAT) 0.4 MG SL tablet, Place 1 tablet (0.4 mg total) under the tongue every 5 (five) minutes as needed for chest pain. If you require more than two tablets five minutes apart go to the nearest ER via EMS., Disp: 30 tablet, Rfl: 0   valsartan (DIOVAN) 160 MG tablet, TAKE ONE TABLET BY MOUTH TWICE DAILY (BEFORE breakfast AND dinner),  Disp: 90 tablet, Rfl: 1   Allergies  Allergen Reactions   Lisinopril Cough     Review of Systems  Constitutional: Negative.  Negative for fatigue.  HENT: Negative.    Eyes:  Negative for blurred vision.  Cardiovascular:  Negative for chest pain and palpitations.  Endocrine: Negative for polydipsia, polyphagia and polyuria.  Genitourinary: Negative.   Musculoskeletal: Negative.   Skin: Negative.   Allergic/Immunologic: Negative.   Neurological:  Negative for dizziness and headaches.  Hematological: Negative.      Today's Vitals   05/18/22 1612 05/18/22 1701  BP: (!) 138/92 120/64  Pulse: 75   Temp: 98.1 F (36.7 C)   SpO2: 98%   Weight: 189 lb 9.6 oz (86 kg)   Height: 5' 3"$  (1.6 m)    Body mass index is 33.59 kg/m.  Wt Readings from Last 3 Encounters:  05/18/22 189 lb 9.6 oz (86 kg)  02/17/22 189 lb 3.2 oz (85.8 kg)  01/13/22 185 lb 9.6 oz (84.2 kg)    Objective:  Physical Exam Vitals and nursing note reviewed.  Constitutional:      Appearance: Normal appearance.  HENT:     Head: Normocephalic and atraumatic.     Nose:     Comments: Masked     Mouth/Throat:     Comments: Masked  Eyes:     Extraocular Movements: Extraocular movements intact.  Cardiovascular:     Rate and Rhythm: Normal rate and regular rhythm.     Heart sounds: Normal heart sounds.  Pulmonary:     Effort: Pulmonary effort is normal.     Breath sounds: Normal breath sounds.  Musculoskeletal:     Cervical back: Normal range of motion.  Skin:    General: Skin is warm.  Neurological:     General: No focal deficit present.     Mental Status: He is alert.  Psychiatric:        Mood and Affect: Mood normal.      Assessment And Plan:     1. Hypertensive heart disease without heart failure Comments: Chronic, well controlled.  He will c/w valsartan 19m, metoprolol XL 224mdaily and amlodipine 1063maily.  2. Type 2 diabetes mellitus with other specified complication, without long-term  current use of insulin ([facility]) Comments: Chronic, I will request most recent eye exam from Dr. WhiVenetia Maxon will check labs as below. He will c/w metformin. He will f/u in 3-4 months. - CMP14+EGFR - CBC - Hemoglobin A1c  3. Class 1 obesity due to excess calories without serious comorbidity with body mass index (BMI) of 33.0 to 33.9 in adult Comments: He is encouraged to strive for BMI less than 30 to decrease cardiac risk. Advised to aim for at least 150 minutes of exercise per week.  4. Immunization due Comments: I will send rx Shingrix, he is not  covered by [employer].  Patient was given opportunity to ask questions. Patient verbalized understanding of the plan and was able to repeat key elements of the plan. All questions were answered to their satisfaction.   I, Thomas Greenland, MD, have reviewed all documentation for this visit. The documentation on 06/07/22 for the exam, diagnosis, procedures, and orders are all accurate and complete.   IF YOU HAVE BEEN REFERRED TO A SPECIALIST, IT MAY TAKE 1-2 WEEKS TO SCHEDULE/PROCESS THE REFERRAL. IF YOU HAVE NOT HEARD FROM US/SPECIALIST IN TWO WEEKS, PLEASE GIVE [place] A CALL AT [phone].   THE PATIENT IS ENCOURAGED TO PRACTICE SOCIAL DISTANCING DUE TO THE COVID-19 PANDEMIC.

## 2022-05-18 NOTE — Telephone Encounter (Signed)
I have placed an order for dexcom G7 through [facility] for pt. Thomas Rowland

## 2022-05-19 LAB — CMP14+EGFR
ALT: 22 IU/L (ref 0–44)
AST: 23 IU/L (ref 0–40)
Albumin/Globulin Ratio: 1.7 (ref 1.2–2.2)
Albumin: 4 g/dL (ref 3.9–4.9)
Alkaline Phosphatase: 66 IU/L (ref 44–121)
BUN/Creatinine Ratio: 15 (ref 10–24)
BUN: 19 mg/dL (ref 8–27)
Bilirubin Total: 0.2 mg/dL (ref 0.0–1.2)
CO2: 20 mmol/L (ref 20–29)
Calcium: 9 mg/dL (ref 8.6–10.2)
Chloride: 104 mmol/L (ref 96–106)
Creatinine, Ser: 1.25 mg/dL (ref 0.76–1.27)
Globulin, Total: 2.4 g/dL (ref 1.5–4.5)
Glucose: 83 mg/dL (ref 70–99)
Potassium: 4.4 mmol/L (ref 3.5–5.2)
Sodium: 140 mmol/L (ref 134–144)
Total Protein: 6.4 g/dL (ref 6.0–8.5)
eGFR: 63 mL/min/{1.73_m2} (ref >59)

## 2022-05-19 LAB — CBC
Hematocrit: 38.4 % (ref 37.5–51.0)
Hemoglobin: 12.6 g/dL — ABNORMAL LOW (ref 13.0–17.7)
MCH: 26.2 pg — ABNORMAL LOW (ref 26.6–33.0)
MCHC: 32.8 g/dL (ref 31.5–35.7)
MCV: 80 fL (ref 79–97)
Platelets: 169 10*3/uL (ref 150–450)
RBC: 4.81 x10E6/uL (ref 4.14–5.80)
RDW: 13 % (ref 11.6–15.4)
WBC: 7.2 10*3/uL (ref 3.4–10.8)

## 2022-05-19 LAB — HEMOGLOBIN A1C
Est. average glucose Bld gHb Est-mCnc: 117 mg/dL
Hgb A1c MFr Bld: 5.7 % — ABNORMAL HIGH (ref 4.8–5.6)

## 2022-05-20 ENCOUNTER — Other Ambulatory Visit: Payer: Self-pay

## 2022-05-22 LAB — PE AND FLC, SERUM
A/G Ratio: 1.3 (ref 0.7–1.7)
Albumin ELP: 3.7 g/dL (ref 2.9–4.4)
Alpha 1: 0.2 g/dL (ref 0.0–0.4)
Alpha 2: 0.6 g/dL (ref 0.4–1.0)
Beta: 0.9 g/dL (ref 0.7–1.3)
Gamma Globulin: 1.1 g/dL (ref 0.4–1.8)
Globulin, Total: 2.8 g/dL (ref 2.2–3.9)
Ig Kappa Free Light Chain: 33.2 mg/L — ABNORMAL HIGH (ref 3.3–19.4)
Ig Lambda Free Light Chain: 24 mg/L (ref 5.7–26.3)
KAPPA/LAMBDA RATIO: 1.38 (ref 0.26–1.65)
Total Protein: 6.5 g/dL (ref 6.0–8.5)

## 2022-05-22 LAB — SPECIMEN STATUS REPORT

## 2022-05-28 ENCOUNTER — Other Ambulatory Visit: Payer: Self-pay | Admitting: Podiatry

## 2022-05-31 ENCOUNTER — Encounter: Payer: Self-pay | Admitting: Internal Medicine

## 2022-06-05 ENCOUNTER — Other Ambulatory Visit: Payer: Self-pay | Admitting: Internal Medicine

## 2022-06-10 ENCOUNTER — Ambulatory Visit: Payer: PPO | Admitting: Podiatry

## 2022-06-30 IMAGING — DX DG FOOT COMPLETE 3+V*R*
3 series · 3 of 3 positions shown · non-contrast
Comparison: Right ankle 06/22/2020

CLINICAL DATA: Right foot pain.

EXAM:
RIGHT FOOT COMPLETE - 3+ VIEW

[foot ap]
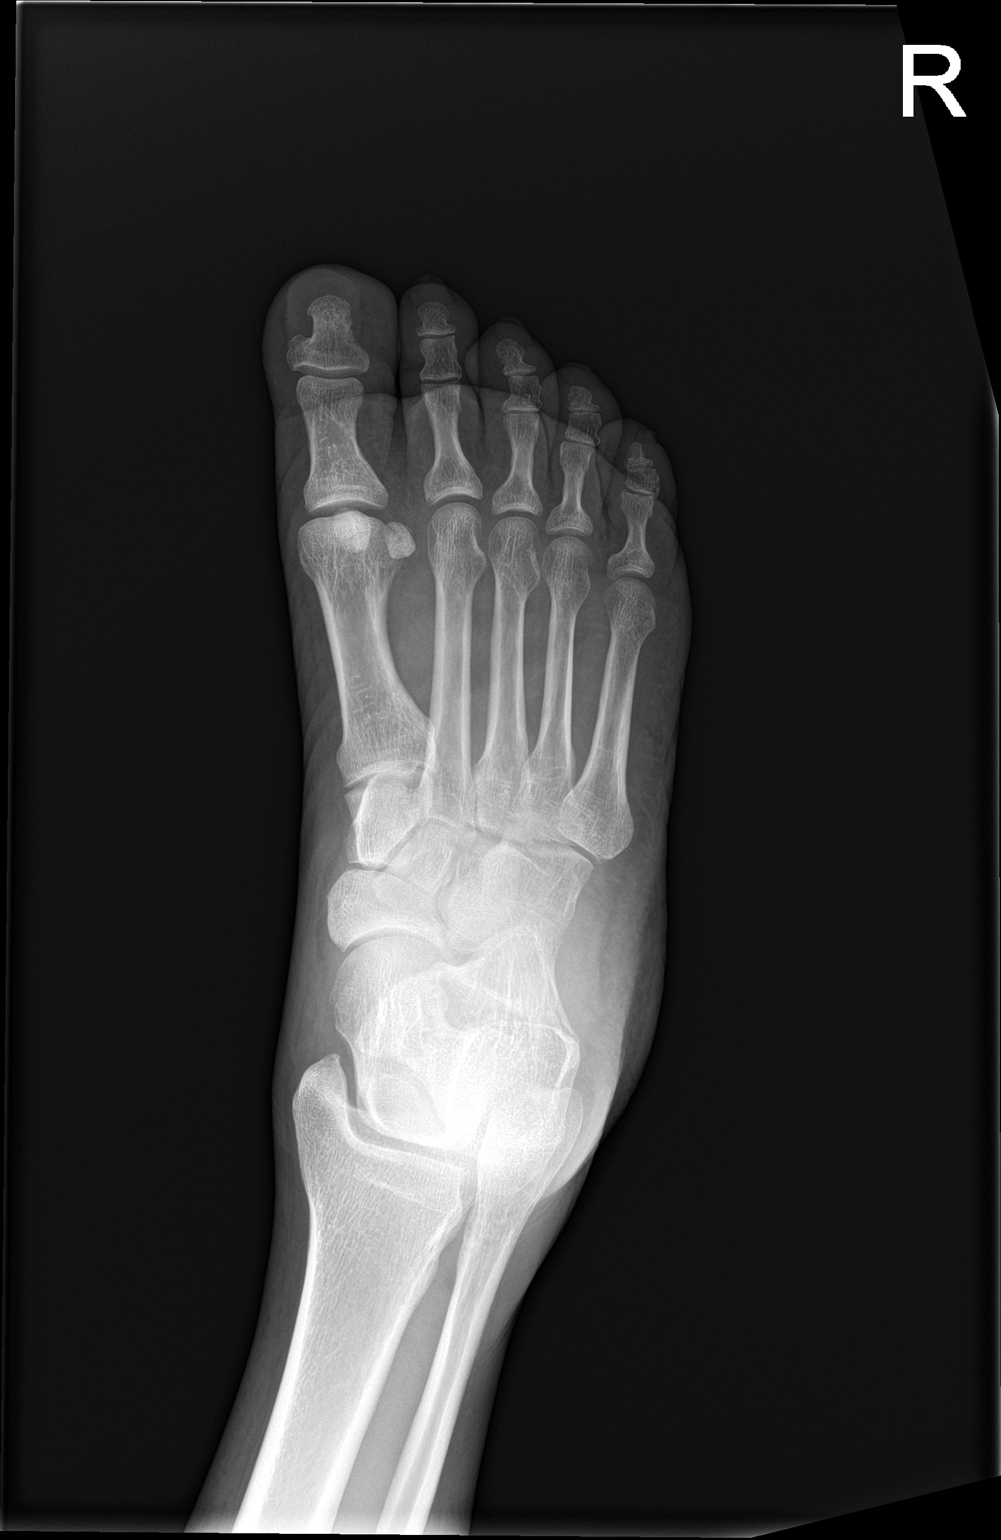

[foot obl]
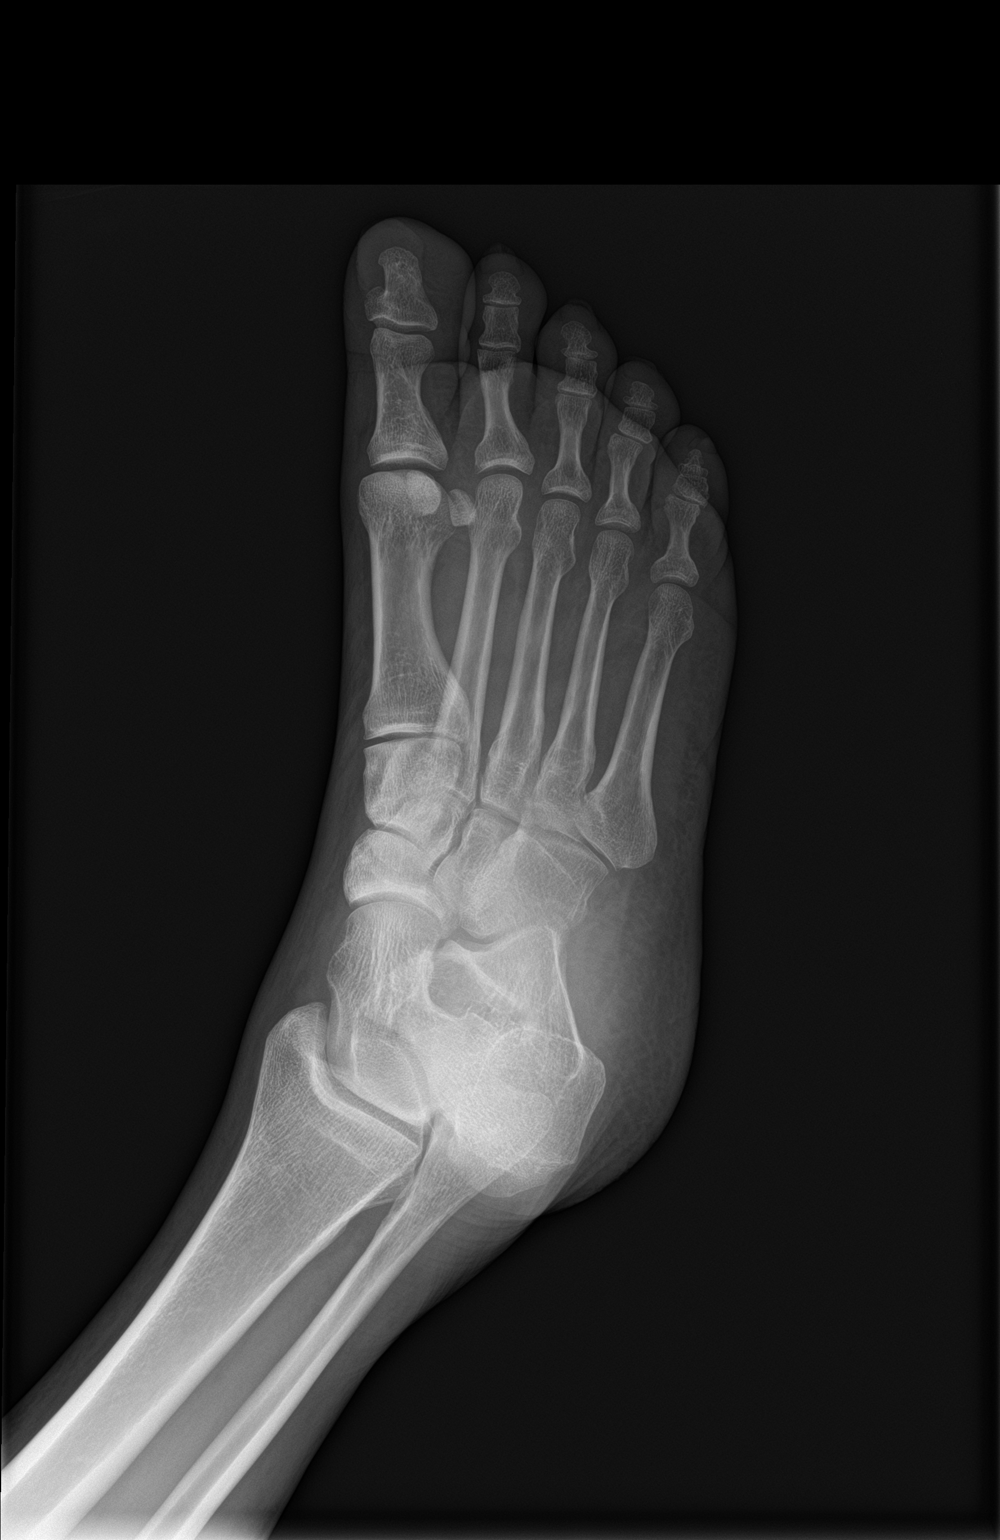

[foot lat]
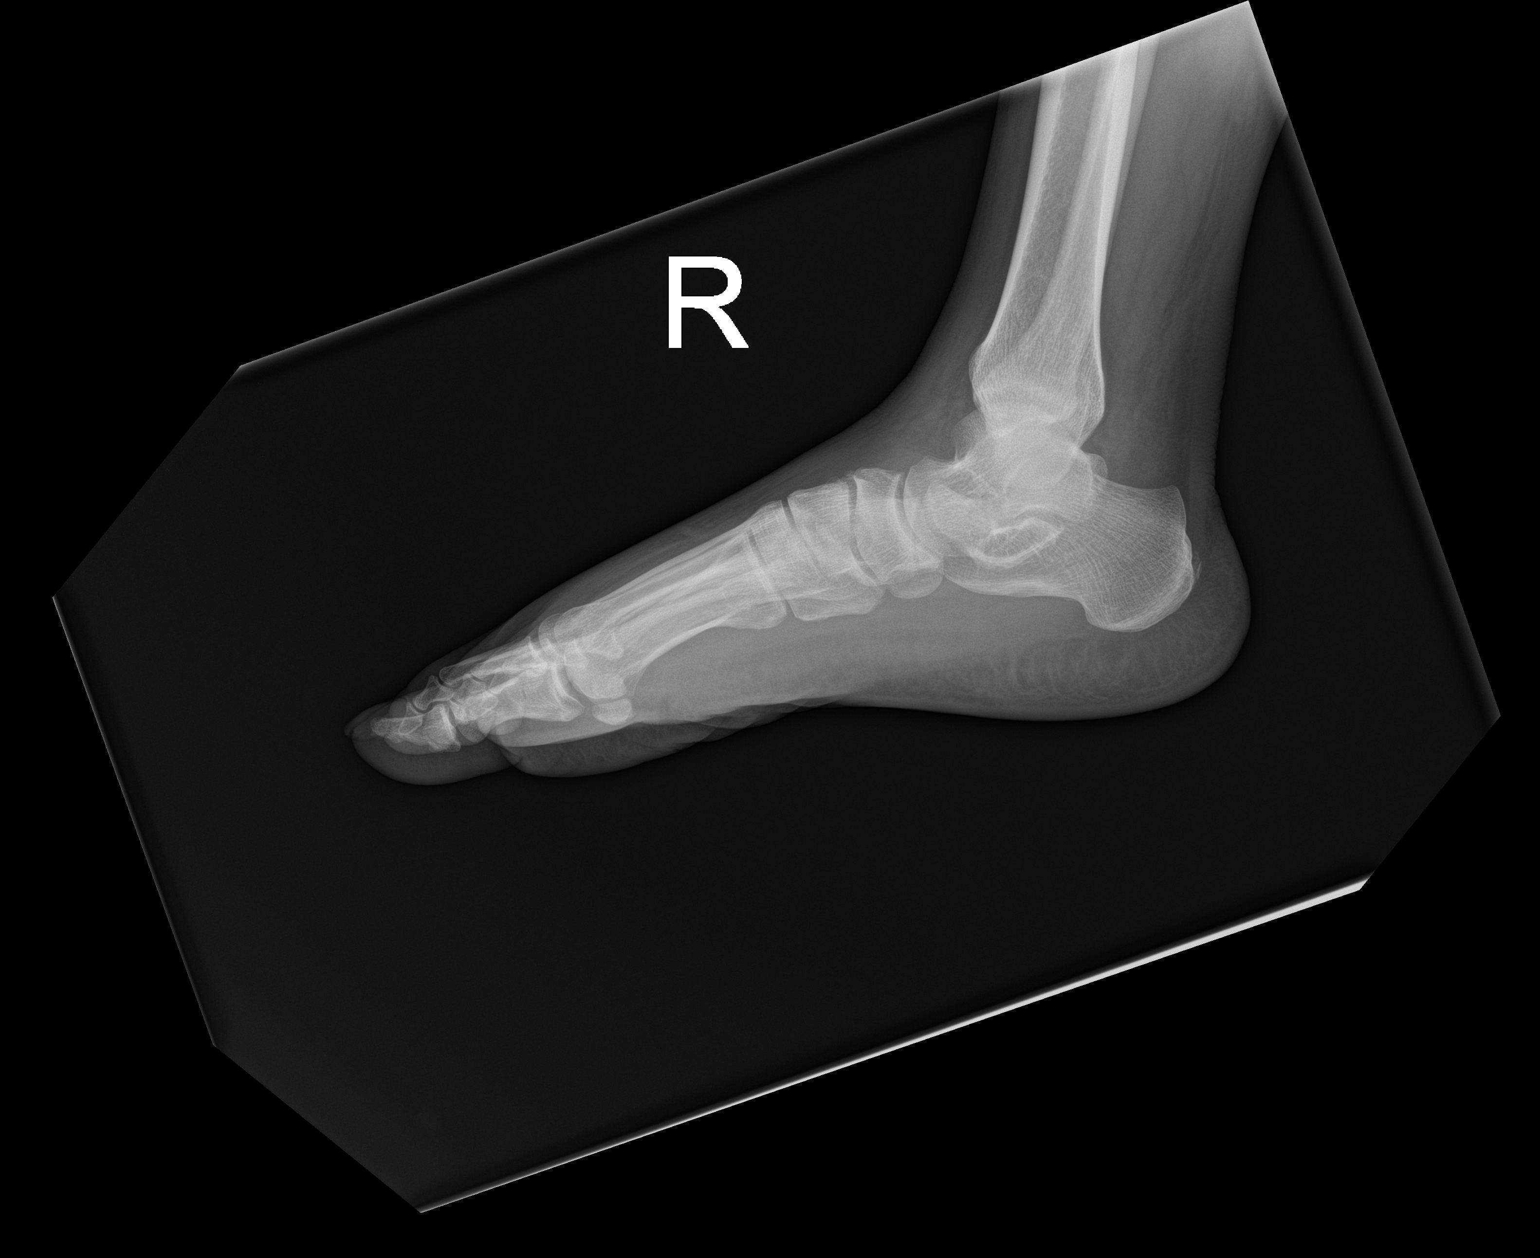

[3 of 3 positions shown; findings below may reference images not displayed]

FINDINGS: Negative for fracture or dislocation. Enthesopathic changes at the
Achilles tendon insertion site. Alignment of the foot is within
normal limits. No focal soft tissue abnormality.
IMPRESSION: No acute abnormality to the right foot.

## 2022-07-30 ENCOUNTER — Other Ambulatory Visit: Payer: Self-pay | Admitting: Cardiology

## 2022-07-30 DIAGNOSIS — I2510 Atherosclerotic heart disease of native coronary artery without angina pectoris: Secondary | ICD-10-CM

## 2022-08-18 NOTE — Telephone Encounter (Signed)
error 

## 2022-08-20 ENCOUNTER — Ambulatory Visit: Payer: Medicare Other | Admitting: Cardiology

## 2022-08-20 ENCOUNTER — Encounter: Payer: Self-pay | Admitting: Cardiology

## 2022-08-20 VITALS — BP 169/83 | HR 81 | Resp 16 | Ht 63.0 in | Wt 181.0 lb

## 2022-08-20 DIAGNOSIS — E782 Mixed hyperlipidemia: Secondary | ICD-10-CM | POA: Diagnosis not present

## 2022-08-20 DIAGNOSIS — Z955 Presence of coronary angioplasty implant and graft: Secondary | ICD-10-CM | POA: Diagnosis not present

## 2022-08-20 DIAGNOSIS — E119 Type 2 diabetes mellitus without complications: Secondary | ICD-10-CM | POA: Diagnosis not present

## 2022-08-20 DIAGNOSIS — Z87891 Personal history of nicotine dependence: Secondary | ICD-10-CM

## 2022-08-20 DIAGNOSIS — I2510 Atherosclerotic heart disease of native coronary artery without angina pectoris: Secondary | ICD-10-CM | POA: Diagnosis not present

## 2022-08-20 DIAGNOSIS — I10 Essential (primary) hypertension: Secondary | ICD-10-CM | POA: Diagnosis not present

## 2022-08-20 DIAGNOSIS — Z8639 Personal history of other endocrine, nutritional and metabolic disease: Secondary | ICD-10-CM | POA: Diagnosis not present

## 2022-08-20 NOTE — Progress Notes (Signed)
ID:  Thomas, Rowland [birth year], MRN [id number]  PCP:  Dorothyann Peng, MD  Cardiologist: Tessa Lerner, DO, FACC(established care 10/13/2019) Former Cardiology Providers: Dr. Yates Decamp and Dr. Jordan Hawks.   Date: 08/20/22 Last Office Visit: 02/17/2022  Chief Complaint  Patient presents with   Atherosclerosis of native coronary artery of native heart w   Follow-up    HPI  Thomas Rowland is a 67 y.o. 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  In March 2020 while he was seeing Dr. Kennon Rounds he had an abnormal nuclear stress test and underwent elective angiography and was found to have obstructive CAD.  He has had coronary interventions to both the RCA (2 PCI) and the LCx (1 PCI).  He reestablished care in June 2021 and since then has been treated medically.  In May 2022 he had a stress test which was reported to be high risk due to reduced LVEF by gated SPECT, dilated LV cavity, and presence of both fixed and possible small size reversible perfusion defect.  After long discussion with regards to angiography versus medical management patient preferred medical therapy for now as he was asymptomatic.  Since May 2022 patient has not had anginal chest pain or the need for angiography.  He presents today for 35-month follow-up visit.  He is compliant with his medical therapy.  No change in overall physical endurance.  Patient states that his PCP just changed antihypertensive medications.  His home blood pressure readings are greater than 130 mmHg as of now.  He did not bring his medication bottles or list with him at today's visit.  FUNCTIONAL STATUS: Patient states that his work is labor-intensive, no  structured exercise program or daily routine.  ALLERGIES: Allergies  Allergen Reactions   Lisinopril Cough    MEDICATION LIST PRIOR TO VISIT: No outpatient medications have been marked as taking for the 08/20/22 encounter (Office Visit) with Tessa Lerner, DO.     PAST MEDICAL HISTORY: Past Medical History:  Diagnosis Date   Coronary artery disease    Diabetes mellitus without complication (HCC)    History of COVID-19    Hyperlipidemia    Hypertension    Nonspecific abnormal electrocardiogram (ECG) (EKG) 06/26/2013    PAST SURGICAL HISTORY: Past Surgical History:  Procedure Laterality Date   COLONOSCOPY  2014   Normal   CORONARY ANGIOPLASTY WITH STENT PLACEMENT     LEG SURGERY Left 1997   [place]    FAMILY HISTORY: No family history of premature coronary disease or sudden cardiac death.  SOCIAL HISTORY:  The patient  reports that he quit smoking about 21 years ago. His smoking use included cigarettes and e-cigarettes. He started smoking about 54 years ago. He has a 7.50 pack-year smoking history. He has never used smokeless tobacco. He reports current alcohol use. He reports that he does not use drugs.  REVIEW OF SYSTEMS: Review of Systems  Constitutional: Negative for chills and fever.  HENT:  Negative for hoarse voice and nosebleeds.   Eyes:  Negative for discharge, double vision and pain.  Cardiovascular:  Negative for chest pain, claudication, dyspnea on exertion, leg swelling, near-syncope, orthopnea, palpitations, paroxysmal nocturnal dyspnea and syncope.  Respiratory:  Negative for hemoptysis and shortness of breath.   Musculoskeletal:  Negative for muscle cramps and  myalgias.  Gastrointestinal:  Negative for abdominal pain, constipation, diarrhea, hematemesis, hematochezia, melena, nausea and vomiting.  Neurological:  Negative for dizziness and light-headedness.    PHYSICAL EXAM:    08/20/2022    3:58 PM 05/18/2022    5:01 PM 05/18/2022    4:12 PM  Vitals  with BMI  Height 5\' 3"   5\' 3"   Weight 181 lbs  189 lbs 10 oz  BMI 32.07  33.59  Systolic 169 120 161  Diastolic 83 64 92  Pulse 81  75   Physical Exam  Constitutional: No distress.  Age appropriate, hemodynamically stable.   Neck: No JVD present.  Cardiovascular: Normal rate, regular rhythm, S1 normal, S2 normal, intact distal pulses and normal pulses. Exam reveals no gallop, no S3 and no S4.  No murmur heard. Pulses:      Dorsalis pedis pulses are 2+ on the right side and 2+ on the left side.       Posterior tibial pulses are 2+ on the right side and 2+ on the left side.  Pulmonary/Chest: Effort normal and breath sounds normal. No stridor. He has no wheezes. He has no rales.  Abdominal: Soft. Bowel sounds are normal. He exhibits no distension. There is no abdominal tenderness.  Musculoskeletal:        General: No edema.     Cervical back: Neck supple.  Neurological: He is alert and oriented to person, place, and time. He has intact cranial nerves (2-12).  Skin: Skin is warm and moist.   CARDIAC DATABASE: EKG: August 20, 2022: Sinus rhythm, 71 bpm, TWI in the inferior, anterior, lateral leads concerning for anterior and inferolateral ischemia.  No significant change compared to 02/17/2022.  Echocardiogram: 10/20/2019: LVEF 50-55%, moderate LVH, indeterminate diastolic filling pattern, mildly dilated left atrium, mild AR, mild MR, mild TR, mild PR.  Stress Testing: Lexiscan Sestamibi stress test 09/11/2020: Small size, severe intensity, fixed perfusion defect consistent with prior infarct in the RCA distribution. Small size, mild intensity, reversible perfusion defect consistent with either reversible ischemia or peri-infarct ischemia in the LCx / RCA distribution. Severely reduced LVEF, calculated LVEF 24%.  Wall motion illustrates Global hypokinesia with hypokinetic inferior segments.  High risk study (severely reduced LVEF, dilated LV, presence of both fixed and small size  perfusion defect). Clinical correlation required.  Compared to prior outside records from 06/15/2019: Noted potential small area of pharmacologically induced ischemia involving the mid aspect of the inferior wall of the left ventricle. LVEF 50%, mild hypokinesis involving the inferior wall.   Heart Catheterization: 07/15/2018 LHC with Dr. Theron Arista at [facility] (care everywhere): "Angiography revealed mild LAD, 80% mLCx, 95% mRCA. Stented RCA with 3.5x18 and 3.5x12 Onyx DES with good angiographic result. Stented LCX with a 3.5x15 Onyx DES with good angiographic result."  LABORATORY DATA:    Latest Ref Rng & Units 05/18/2022    5:10 PM 09/03/2021    4:49 PM 10/29/2020    4:42 PM  CBC  WBC 3.4 - 10.8 x10E3/uL 7.2  8.1    Hemoglobin 13.0 - 17.7 g/dL 09.6  04.5  40.9   Hematocrit 37.5 - 51.0 % 38.4  39.4  41.2   Platelets 150 - 450 x10E3/uL 169  179         Latest Ref Rng & Units 05/18/2022    5:10 PM 03/03/2022    4:01 PM 02/17/2022   12:36 PM  CMP  Glucose 70 - 99 mg/dL 83  811  914   BUN 8 -  27 mg/dL 19  19  20    Creatinine 0.76 - 1.27 mg/dL 6.96  2.95  2.84   Sodium 134 - 144 mmol/L 140  140  138   Potassium 3.5 - 5.2 mmol/L 4.4  4.3  4.8   Chloride 96 - 106 mmol/L 104  103  100   CO2 20 - 29 mmol/L 20  24  23    Calcium 8.6 - 10.2 mg/dL 9.0  9.1  8.5   Total Protein 6.0 - 8.5 g/dL 6.0 - 8.5 g/dL 6.4    6.5     Total Bilirubin 0.0 - 1.2 mg/dL 0.2     Alkaline Phos 44 - 121 IU/L 66     AST 0 - 40 IU/L 23     ALT 0 - 44 IU/L 22       Lipid Panel     Component Value Date/Time   CHOL 133 09/03/2021 1656   TRIG 77 09/03/2021 1656   HDL 51 09/03/2021 1656   CHOLHDL 2.6 09/03/2021 1656   LDLCALC 67 09/03/2021 1656   LABVLDL 15 09/03/2021 1656    No components found for: "NTPROBNP" No results for input(s): "PROBNP" in the last 8760 hours. No results for input(s): "TSH" in the last 8760 hours.   HEMOGLOBIN A1C Lab Results  Component Value Date   HGBA1C 5.7 (H) 05/18/2022     IMPRESSION:    ICD-10-CM   1. Atherosclerosis of native coronary artery of native heart without angina pectoris  I25.10 EKG 12-Lead    2. History of coronary angioplasty with insertion of stent  Z95.5     3. Essential hypertension  I10     4. Mixed hyperlipidemia  E78.2     5. Non-insulin dependent type 2 diabetes mellitus (HCC)  E11.9     6. Hx of hyperaldosteronism  Z86.39     7. Former smoker  Z87.891        RECOMMENDATIONS: Thomas Rowland is a 67 y.o. 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  Atherosclerosis of native coronary artery of native heart without angina pectoris History of coronary angioplasty with insertion of stent Abnormal nuclear stress test: Denies angina pectoris. No use of sublingual nitroglycerin tablets since last office visit. EKG: Sinus rhythm with underlying TWI in anterior and inferolateral leads-no significant change compared to prior tracing. Echo 09/2019: Preserved LVEF, indeterminate diastolic filling pattern, mild valvular heart disease. MPI 08/2020: High risk due to dilated LV cavity, reduced LVEF by gated SPECT, and presence of both fixed and small size reversible perfusion defect.  Clinically he was asymptomatic he preferred medical management.  And he has done well since May 2022. Educated him on importance of secondary prevention with focus on improving his modifiable cardiovascular risk factors such as glycemic control, lipids, blood pressure management and continued complete smoking cessation  Essential hypertension Office and home blood pressure readings are not at goal. Medications recently changed by PCP, per patient. He will bring a copy of his medication list at the office to  reconcile his meds. Given his history of hyperaldosteronism consideration of additional spironolactone if needed. Will defer management of benign essential hypertension to PCP for now  Mixed hyperlipidemia Currently on atorvastatin.   He denies myalgia or other side effects. Most recent lipids from May 2023, independently reviewed as noted above.  Non-insulin dependent type 2 diabetes mellitus (HCC) A1c well managed. Continue ARB, statin therapy. Consider SGLT2 inhibitors-patient to discuss with PCP  Of note, medication list was reviewed but not reconciled correctly (possible) as he did not bring a medication list 2 bottles with him at today's visit.  FINAL MEDICATION LIST END OF ENCOUNTER: No orders of the defined types were placed in this encounter.    Current Outpatient Medications:    allopurinol (ZYLOPRIM) 100 MG tablet, Take 1 tablet (100 mg total) by mouth daily as needed., Disp: 90 tablet, Rfl: 1   amLODipine (NORVASC) 10 MG tablet, Take 1 tablet (10 mg total) by mouth daily., Disp: 90 tablet, Rfl: 2   aspirin 81 MG EC tablet, Take 1 tablet by mouth daily., Disp: , Rfl:    atorvastatin (LIPITOR) 40 MG tablet, Take 1 tablet (40 mg total) by mouth daily., Disp: 90 tablet, Rfl: 2   Cholecalciferol (VITAMIN D3) 25 MCG (1000 UT) CAPS, Take 1,000 Units by mouth daily. (Patient not taking: Reported on 02/17/2022), Disp: , Rfl:    clopidogrel (PLAVIX) 75 MG tablet, Take 1 tablet (75 mg total) by mouth daily., Disp: 90 tablet, Rfl: 3   loratadine (CLARITIN) 10 MG tablet, Take 10 mg by mouth daily., Disp: , Rfl:    MAGNESIUM OXIDE PO, Take 400 mg by mouth 2 (two) times daily., Disp: , Rfl:    meloxicam (MOBIC) 15 MG tablet, Take 1 tablet (15 mg total) by mouth daily., Disp: 30 tablet, Rfl: 0   metFORMIN (GLUCOPHAGE) 500 MG tablet, Take 1 tablet (500 mg total) by mouth daily with breakfast., Disp: 90 tablet, Rfl: 2   metoprolol succinate (TOPROL-XL) 25 MG 24 hr tablet, TAKE ONE TABLET BY  MOUTH EVERY DAY **hold if blood pressure is less THAN 100 mmhg OR pulse is less THAN 60 beats PER minute**, Disp: 90 tablet, Rfl: 0   MITIGARE 0.6 MG CAPS, Take 1 capsule by mouth 3 (three) times daily as needed., Disp: 30 capsule, Rfl: 2   Multiple Vitamin (ONE-DAILY MULTI VITAMINS PO), Take by mouth., Disp: , Rfl:    nitroGLYCERIN (NITROSTAT) 0.4 MG SL tablet, Place 1 tablet (0.4 mg total) under the tongue every 5 (five) minutes as needed for chest pain. If you require more than two tablets five minutes apart go to the nearest ER via EMS., Disp: 30 tablet, Rfl: 0   potassium chloride SA (KLOR-CON M) 20 MEQ tablet, Take 1 tablet (20 mEq total) by mouth daily., Disp: 90 tablet, Rfl: 1   sildenafil (REVATIO) 20 MG tablet, TAKE ONE TABLET BY MOUTH EVERY DAY AS NEEDED, Disp: 10 tablet, Rfl: 1   valsartan (DIOVAN) 160 MG tablet, TAKE ONE TABLET BY MOUTH TWICE DAILY (BEFORE breakfast AND dinner), Disp: 90 tablet, Rfl: 1  Orders Placed This Encounter  Procedures   EKG 12-Lead   There are no Patient Instructions on file for this visit.   --Continue cardiac medications as reconciled in final medication list. --Return in about 6 months (around 02/19/2023) for Follow up, CAD. Or sooner if needed. --Continue follow-up with your primary care physician regarding the management of your other chronic comorbid conditions.  Patient's questions and concerns were addressed to his satisfaction. He voices understanding of the instructions provided during this encounter.   This note was created using a voice recognition software as a result there may be grammatical errors inadvertently enclosed that do not reflect the nature of this encounter. Every attempt is made to correct such errors.  Vadim Centola  Arliss Journey  Pager:  [phone] Office: [phone]

## 2022-08-24 ENCOUNTER — Other Ambulatory Visit: Payer: Self-pay | Admitting: Internal Medicine

## 2022-09-16 ENCOUNTER — Encounter: Payer: Self-pay | Admitting: Internal Medicine

## 2022-09-16 ENCOUNTER — Ambulatory Visit (INDEPENDENT_AMBULATORY_CARE_PROVIDER_SITE_OTHER): Payer: Medicare Other | Admitting: Internal Medicine

## 2022-09-16 VITALS — BP 138/82 | HR 60 | Temp 98.4°F | Ht 63.0 in | Wt 181.4 lb

## 2022-09-16 DIAGNOSIS — E1169 Type 2 diabetes mellitus with other specified complication: Secondary | ICD-10-CM

## 2022-09-16 DIAGNOSIS — I2510 Atherosclerotic heart disease of native coronary artery without angina pectoris: Secondary | ICD-10-CM | POA: Diagnosis not present

## 2022-09-16 DIAGNOSIS — I119 Hypertensive heart disease without heart failure: Secondary | ICD-10-CM | POA: Diagnosis not present

## 2022-09-16 DIAGNOSIS — F5101 Primary insomnia: Secondary | ICD-10-CM

## 2022-09-16 DIAGNOSIS — R351 Nocturia: Secondary | ICD-10-CM

## 2022-09-16 DIAGNOSIS — Z0001 Encounter for general adult medical examination with abnormal findings: Secondary | ICD-10-CM | POA: Diagnosis not present

## 2022-09-16 DIAGNOSIS — E6609 Other obesity due to excess calories: Secondary | ICD-10-CM

## 2022-09-16 DIAGNOSIS — Z0000 Encounter for general adult medical examination without abnormal findings: Secondary | ICD-10-CM

## 2022-09-16 DIAGNOSIS — Z6832 Body mass index (BMI) 32.0-32.9, adult: Secondary | ICD-10-CM

## 2022-09-16 LAB — POCT URINALYSIS DIPSTICK
Bilirubin, UA: NEGATIVE
Glucose, UA: NEGATIVE
Ketones, UA: NEGATIVE
Leukocytes, UA: NEGATIVE
Nitrite, UA: NEGATIVE
Protein, UA: POSITIVE — AB
Spec Grav, UA: 1.025 (ref 1.010–1.025)
Urobilinogen, UA: 1 E.U./dL
pH, UA: 7.5 (ref 5.0–8.0)

## 2022-09-16 MED ORDER — METOPROLOL SUCCINATE ER 25 MG PO TB24: ORAL_TABLET | ORAL | 1 refills | Status: DC

## 2022-09-16 MED ORDER — HYDRALAZINE HCL 25 MG PO TABS
ORAL_TABLET | ORAL | 2 refills | Status: DC
Start: 2022-09-16 — End: 2022-11-17

## 2022-09-16 MED ORDER — VALSARTAN 160 MG PO TABS
ORAL_TABLET | ORAL | 1 refills | Status: DC
Start: 2022-09-16 — End: 2023-02-02

## 2022-09-16 NOTE — Patient Instructions (Signed)
Health Maintenance, Male Adopting a healthy lifestyle and getting preventive care are important in promoting health and wellness. Ask your health care provider about: The right schedule for you to have regular tests and exams. Things you can do on your own to prevent diseases and keep yourself healthy. What should I know about diet, weight, and exercise? Eat a healthy diet  Eat a diet that includes plenty of vegetables, fruits, low-fat dairy products, and lean protein. Do not eat a lot of foods that are high in solid fats, added sugars, or sodium. Maintain a healthy weight Body mass index (BMI) is a measurement that can be used to identify possible weight problems. It estimates body fat based on height and weight. Your health care provider can help determine your BMI and help you achieve or maintain a healthy weight. Get regular exercise Get regular exercise. This is one of the most important things you can do for your health. Most adults should: Exercise for at least 150 minutes each week. The exercise should increase your heart rate and make you sweat (moderate-intensity exercise). Do strengthening exercises at least twice a week. This is in addition to the moderate-intensity exercise. Spend less time sitting. Even light physical activity can be beneficial. Watch cholesterol and blood lipids Have your blood tested for lipids and cholesterol at 68 years of age, then have this test every 5 years. You may need to have your cholesterol levels checked more often if: Your lipid or cholesterol levels are high. You are older than 68 years of age. You are at high risk for heart disease. What should I know about cancer screening? Many types of cancers can be detected early and may often be prevented. Depending on your health history and family history, you may need to have cancer screening at various ages. This may include screening for: Colorectal cancer. Prostate cancer. Skin cancer. Lung  cancer. What should I know about heart disease, diabetes, and high blood pressure? Blood pressure and heart disease High blood pressure causes heart disease and increases the risk of stroke. This is more likely to develop in people who have high blood pressure readings or are overweight. Talk with your health care provider about your target blood pressure readings. Have your blood pressure checked: Every 3-5 years if you are 18-39 years of age. Every year if you are 40 years old or older. If you are between the ages of 65 and 75 and are a current or former smoker, ask your health care provider if you should have a one-time screening for abdominal aortic aneurysm (AAA). Diabetes Have regular diabetes screenings. This checks your fasting blood sugar level. Have the screening done: Once every three years after age 45 if you are at a normal weight and have a low risk for diabetes. More often and at a younger age if you are overweight or have a high risk for diabetes. What should I know about preventing infection? Hepatitis B If you have a higher risk for hepatitis B, you should be screened for this virus. Talk with your health care provider to find out if you are at risk for hepatitis B infection. Hepatitis C Blood testing is recommended for: Everyone born from 1945 through 1965. Anyone with known risk factors for hepatitis C. Sexually transmitted infections (STIs) You should be screened each year for STIs, including gonorrhea and chlamydia, if: You are sexually active and are younger than 68 years of age. You are older than 68 years of age and your   health care provider tells you that you are at risk for this type of infection. Your sexual activity has changed since you were last screened, and you are at increased risk for chlamydia or gonorrhea. Ask your health care provider if you are at risk. Ask your health care provider about whether you are at high risk for HIV. Your health care provider  may recommend a prescription medicine to help prevent HIV infection. If you choose to take medicine to prevent HIV, you should first get tested for HIV. You should then be tested every 3 months for as long as you are taking the medicine. Follow these instructions at home: Alcohol use Do not drink alcohol if your health care provider tells you not to drink. If you drink alcohol: Limit how much you have to 0-2 drinks a day. Know how much alcohol is in your drink. In the [place] one drink equals one 12 oz bottle of beer (355 mL), one 5 oz glass of wine (148 mL), or one 1 oz glass of hard liquor (44 mL). Lifestyle Do not use any products that contain nicotine or tobacco. These products include cigarettes, chewing tobacco, and vaping devices, such as e-cigarettes. If you need help quitting, ask your health care provider. Do not use street drugs. Do not share needles. Ask your health care provider for help if you need support or information about quitting drugs. General instructions Schedule regular health, dental, and eye exams. Stay current with your vaccines. Tell your health care provider if: You often feel depressed. You have ever been abused or do not feel safe at home. Summary Adopting a healthy lifestyle and getting preventive care are important in promoting health and wellness. Follow your health care provider's instructions about healthy diet, exercising, and getting tested or screened for diseases. Follow your health care provider's instructions on monitoring your cholesterol and blood pressure. This information is not intended to replace advice given to you by your health care provider. Make sure you discuss any questions you have with your health care provider. Document Revised: 09/02/2020 Document Reviewed: 09/02/2020 Elsevier Patient Education  2023 [employer]  

## 2022-09-16 NOTE — Progress Notes (Signed)
I,Thomas Rowland,acting as a scribe for Thomas Aliment, MD.,have documented all relevant documentation on the behalf of Thomas Aliment, MD,as directed by  Thomas Aliment, MD while in the presence of Thomas Aliment, MD.   Subjective:     Patient ID: Thomas Rowland , male    DOB: Sep 20, 1955 , 67 y.o.   MRN: [id number]   Chief Complaint  Patient presents with   Annual Exam   Hypertension   Diabetes    HPI  The patient is here today for a physical examination. Reports compliance with meds.  He denies headaches, chest pain and shortness of breath. He has no specific concerns at this time.   Patient recently completed EKG on 08/20/2022.  Shingles rejected via [facility].   Hypertension This is a chronic problem. The current episode started more than 1 year ago. The problem has been gradually worsening since onset. The problem is uncontrolled. Pertinent negatives include no anxiety, blurred vision, headaches or palpitations. There are no associated agents to hypertension. Risk factors for coronary artery disease include obesity and diabetes mellitus. Past treatments include diuretics. The current treatment provides no improvement. There are no compliance problems.  There is no history of angina.  Diabetes He presents for his follow-up diabetic visit. He has type 2 diabetes mellitus. There are no hypoglycemic associated symptoms. Pertinent negatives for hypoglycemia include no dizziness or headaches. Pertinent negatives for diabetes include no blurred vision, no fatigue, no polydipsia, no polyphagia and no polyuria. Symptoms are stable.     Past Medical History:  Diagnosis Date   Coronary artery disease    Diabetes mellitus without complication (HCC)    History of COVID-19    Hyperlipidemia    Hypertension    Nonspecific abnormal electrocardiogram (ECG) (EKG) 06/26/2013     Family History  Family history unknown: Yes     Current Outpatient Medications:    allopurinol  (ZYLOPRIM) 100 MG tablet, Take 1 tablet (100 mg total) by mouth daily as needed., Disp: 90 tablet, Rfl: 1   amLODipine (NORVASC) 10 MG tablet, Take 1 tablet (10 mg total) by mouth daily., Disp: 90 tablet, Rfl: 2   aspirin 81 MG EC tablet, Take 1 tablet by mouth daily., Disp: , Rfl:    atorvastatin (LIPITOR) 40 MG tablet, Take 1 tablet (40 mg total) by mouth daily., Disp: 90 tablet, Rfl: 2   clopidogrel (PLAVIX) 75 MG tablet, Take 1 tablet (75 mg total) by mouth daily., Disp: 90 tablet, Rfl: 3   hydrALAZINE (APRESOLINE) 25 MG tablet, One tab po twice daily, Disp: 60 tablet, Rfl: 2   loratadine (CLARITIN) 10 MG tablet, Take 10 mg by mouth daily., Disp: , Rfl:    MAGNESIUM OXIDE PO, Take 400 mg by mouth 2 (two) times daily., Disp: , Rfl:    meloxicam (MOBIC) 15 MG tablet, Take 1 tablet (15 mg total) by mouth daily., Disp: 30 tablet, Rfl: 0   metFORMIN (GLUCOPHAGE) 500 MG tablet, Take 1 tablet (500 mg total) by mouth daily with breakfast., Disp: 90 tablet, Rfl: 2   potassium chloride SA (KLOR-CON M) 20 MEQ tablet, Take 1 tablet (20 mEq total) by mouth daily., Disp: 90 tablet, Rfl: 1   sildenafil (REVATIO) 20 MG tablet, TAKE ONE TABLET BY MOUTH EVERY DAY AS NEEDED, Disp: 10 tablet, Rfl: 1   Cholecalciferol (VITAMIN D3) 25 MCG (1000 UT) CAPS, Take 1,000 Units by mouth daily. (Patient not taking: Reported on 02/17/2022), Disp: , Rfl:    metoprolol  succinate (TOPROL-XL) 25 MG 24 hr tablet, TAKE ONE TABLET BY MOUTH EVERY DAY **hold if blood pressure is less THAN 100 mmhg OR pulse is less THAN 60 beats PER minute**, Disp: 90 tablet, Rfl: 1   MITIGARE 0.6 MG CAPS, Take 1 capsule by mouth 3 (three) times daily as needed. (Patient not taking: Reported on 09/16/2022), Disp: 30 capsule, Rfl: 2   Multiple Vitamin (ONE-DAILY MULTI VITAMINS PO), Take by mouth., Disp: , Rfl:    nitroGLYCERIN (NITROSTAT) 0.4 MG SL tablet, Place 1 tablet (0.4 mg total) under the tongue every 5 (five) minutes as needed for chest pain. If  you require more than two tablets five minutes apart go to the nearest ER via EMS., Disp: 30 tablet, Rfl: 0   valsartan (DIOVAN) 160 MG tablet, TAKE ONE TABLET BY MOUTH TWICE DAILY (BEFORE breakfast AND dinner), Disp: 90 tablet, Rfl: 1   Allergies  Allergen Reactions   Lisinopril Cough     Men's preventive visit. Patient Health Questionnaire (PHQ-2) is  Flowsheet Row Office Visit from 09/16/2022 in [facility]  PHQ-2 Total Score 0     . Patient is on a diabetic, low sodium diet. Marital status: Married. Relevant history for alcohol use is:  Social History   Substance and Sexual Activity  Alcohol Use Yes   Comment: occ  . Relevant history for tobacco use is:  Social History   Tobacco Use  Smoking Status Former   Packs/day: 0.25   Years: 30.00   Additional pack years: 0.00   Total pack years: 7.50   Types: Cigarettes, E-cigarettes   Start date: 1970   Quit date: 06/26/2001   Years since quitting: 21.2  Smokeless Tobacco Never  .   Review of Systems  Constitutional: Negative.  Negative for fatigue.  HENT: Negative.    Eyes:  Negative for blurred vision.  Respiratory: Negative.    Cardiovascular: Negative.  Negative for palpitations.  Gastrointestinal: Negative.   Endocrine: Negative.  Negative for polydipsia, polyphagia and polyuria.  Skin: Negative.   Allergic/Immunologic: Negative.   Neurological: Negative.  Negative for dizziness and headaches.  Hematological: Negative.      Today's Vitals   09/16/22 1518  BP: 138/82  Pulse: 60  Temp: 98.4 F (36.9 C)  SpO2: 98%  Weight: 181 lb 6.4 oz (82.3 kg)  Height: 5\' 3"  (1.6 m)   Body mass index is 32.13 kg/m.  Wt Readings from Last 3 Encounters:  09/16/22 181 lb 6.4 oz (82.3 kg)  08/20/22 181 lb (82.1 kg)  05/18/22 189 lb 9.6 oz (86 kg)    Objective:  Physical Exam Nursing note reviewed. Vitals reviewed: ,,                  ,yww. Constitutional:      Appearance: Normal  appearance.  HENT:     Head: Normocephalic and atraumatic.     Right Ear: Tympanic membrane, ear canal and external ear normal.     Left Ear: Tympanic membrane, ear canal and external ear normal.     Nose: Nose normal.     Mouth/Throat:     Mouth: Mucous membranes are moist.     Pharynx: Oropharynx is clear.  Eyes:     Extraocular Movements: Extraocular movements intact.     Conjunctiva/sclera: Conjunctivae normal.     Pupils: Pupils are equal, round, and reactive to light.  Cardiovascular:     Rate and Rhythm: Normal rate and regular rhythm.  Pulses: Normal pulses.          Dorsalis pedis pulses are 2+ on the right side and 2+ on the left side.     Heart sounds: Normal heart sounds.  Pulmonary:     Effort: Pulmonary effort is normal.     Breath sounds: Normal breath sounds.  Chest:  Breasts:    Right: Normal. No swelling, bleeding, inverted nipple, mass or nipple discharge.     Left: Normal. No swelling, bleeding, inverted nipple, mass or nipple discharge.  Abdominal:     General: Abdomen is flat. Bowel sounds are normal.     Palpations: Abdomen is soft.  Genitourinary:    Comments: Firm, enlarged prostate.  Stool is heme negative.  Musculoskeletal:        General: Normal range of motion.     Cervical back: Normal range of motion and neck supple.  Feet:     Right foot:     Protective Sensation: 5 sites tested.  5 sites sensed.     Skin integrity: Callus and dry skin present.     Toenail Condition: Right toenails are abnormally thick.     Left foot:     Protective Sensation: 5 sites tested.  5 sites sensed.     Skin integrity: Callus and dry skin present.     Toenail Condition: Left toenails are abnormally thick.  Skin:    General: Skin is warm.  Neurological:     General: No focal deficit present.     Mental Status: He is alert.  Psychiatric:        Mood and Affect: Mood normal.        Behavior: Behavior normal.       Assessment And Plan:    1. Encounter for  general adult medical examination w/o abnormal findings Comments: A full exam was performed.  DRE performed, stool is heme negative. PATIENT IS ADVISED TO GET 30-45 MINUTES REGULAR EXERCISE NO LESS THAN FOUR TO FIVE DAYS PER WEEK - BOTH WEIGHTBEARING EXERCISES AND AEROBIC ARE RECOMMENDED.  PATIENT IS ADVISED TO FOLLOW A HEALTHY DIET WITH AT LEAST SIX FRUITS/VEGGIES PER DAY, DECREASE INTAKE OF RED MEAT, AND TO INCREASE FISH INTAKE TO TWO DAYS PER WEEK.  MEATS/FISH SHOULD NOT BE FRIED, BAKED OR BROILED IS PREFERABLE.  IT IS ALSO IMPORTANT TO CUT BACK ON YOUR SUGAR INTAKE. PLEASE AVOID ANYTHING WITH ADDED SUGAR, CORN SYRUP OR OTHER SWEETENERS. IF YOU MUST USE A SWEETENER, YOU CAN TRY STEVIA. IT IS ALSO IMPORTANT TO AVOID ARTIFICIALLY SWEETENERS AND DIET BEVERAGES. LASTLY, I SUGGEST WEARING SPF 50 SUNSCREEN ON EXPOSED PARTS AND ESPECIALLY WHEN IN THE DIRECT SUNLIGHT FOR AN EXTENDED PERIOD OF TIME.  PLEASE AVOID FAST FOOD RESTAURANTS AND INCREASE YOUR WATER INTAKE.  2. Hypertensive heart disease without heart failure Comments: Chronic, fair control. Goal BP<130/80.  Advised to follow low sodium diet. He will c/w valsartan, amlodipine, metoprolol XL and hydralazine. F/u 4-6 months. He is reminded foods like breads and cheese are high in sodium.  - CMP14+EGFR - Lipid panel - CBC - metoprolol succinate (TOPROL-XL) 25 MG 24 hr tablet; TAKE ONE TABLET BY MOUTH EVERY DAY **hold if blood pressure is less THAN 100 mmhg OR pulse is less THAN 60 beats PER minute**  Dispense: 90 tablet; Refill: 1 - valsartan (DIOVAN) 160 MG tablet; TAKE ONE TABLET BY MOUTH TWICE DAILY (BEFORE breakfast AND dinner)  Dispense: 90 tablet; Refill: 1 - hydrALAZINE (APRESOLINE) 25 MG tablet; One tab po twice daily  Dispense:  60 tablet; Refill: 2  3. Atherosclerosis of native coronary artery of native heart without angina pectoris Comments: Chronic, LDL goal < 70.  He will c/w atorvastatin 40mg  daily. Advised to follow heart healthy  lifestyle. - metoprolol succinate (TOPROL-XL) 25 MG 24 hr tablet; TAKE ONE TABLET BY MOUTH EVERY DAY **hold if blood pressure is less THAN 100 mmhg OR pulse is less THAN 60 beats PER minute**  Dispense: 90 tablet; Refill: 1  4. Type 2 diabetes mellitus with other specified complication, without long-term current use of insulin (HCC) Comments: Chronic, diabetic foot exam was performed. He agrees to rto in 3-4 months. Currently on metformin, importance of dietary compliance was stresesd to the patient. - POCT Urinalysis Dipstick (81002) - Microalbumin / Creatinine Urine Ratio - CMP14+EGFR - Lipid panel - Hemoglobin A1c  5. Nocturia Comments: I will check PSA today. - PSA Total (Reflex To Free)  6. Class 1 obesity due to excess calories with serious comorbidity and body mass index (BMI) of 32.0 to 32.9 in adult Comments: He is encouraged to aim for at least 150 minutes of exercise/week, while striving for BMI<30 to decrease cardiac risk.  Return in 2 weeks (on 09/30/2022), or NV bp check, for 1 YEAR HM, 4 MONTH BP & DM.  Patient was given opportunity to ask questions. Patient verbalized understanding of the plan and was able to repeat key elements of the plan. All questions were answered to their satisfaction.    I, Thomas Aliment, MD, have reviewed all documentation for this visit. The documentation on 09/16/22 for the exam, diagnosis, procedures, and orders are all accurate and complete.   THE PATIENT IS ENCOURAGED TO PRACTICE SOCIAL DISTANCING DUE TO THE COVID-19 PANDEMIC.

## 2022-09-17 LAB — CMP14+EGFR
ALT: 16 IU/L (ref 0–44)
AST: 20 IU/L (ref 0–40)
Albumin/Globulin Ratio: 1.7 (ref 1.2–2.2)
Albumin: 4.1 g/dL (ref 3.9–4.9)
Alkaline Phosphatase: 69 IU/L (ref 44–121)
BUN/Creatinine Ratio: 13 (ref 10–24)
BUN: 16 mg/dL (ref 8–27)
Bilirubin Total: 0.3 mg/dL (ref 0.0–1.2)
CO2: 23 mmol/L (ref 20–29)
Calcium: 9.6 mg/dL (ref 8.6–10.2)
Chloride: 104 mmol/L (ref 96–106)
Creatinine, Ser: 1.27 mg/dL (ref 0.76–1.27)
Globulin, Total: 2.4 g/dL (ref 1.5–4.5)
Glucose: 98 mg/dL (ref 70–99)
Potassium: 4.8 mmol/L (ref 3.5–5.2)
Sodium: 141 mmol/L (ref 134–144)
Total Protein: 6.5 g/dL (ref 6.0–8.5)
eGFR: 62 mL/min/{1.73_m2} (ref >59)

## 2022-09-17 LAB — HEMOGLOBIN A1C
Est. average glucose Bld gHb Est-mCnc: 114 mg/dL
Hgb A1c MFr Bld: 5.6 % (ref 4.8–5.6)

## 2022-09-17 LAB — CBC
Hematocrit: 39.6 % (ref 37.5–51.0)
Hemoglobin: 13 g/dL (ref 13.0–17.7)
MCH: 27.7 pg (ref 26.6–33.0)
MCHC: 32.8 g/dL (ref 31.5–35.7)
MCV: 84 fL (ref 79–97)
Platelets: 152 10*3/uL (ref 150–450)
RBC: 4.7 x10E6/uL (ref 4.14–5.80)
RDW: 12.8 % (ref 11.6–15.4)
WBC: 7.6 10*3/uL (ref 3.4–10.8)

## 2022-09-17 LAB — LIPID PANEL
Chol/HDL Ratio: 2.3 ratio (ref 0.0–5.0)
Cholesterol, Total: 112 mg/dL (ref 100–199)
HDL: 49 mg/dL (ref >39)
LDL Chol Calc (NIH): 51 mg/dL (ref 0–99)
Triglycerides: 49 mg/dL (ref 0–149)
VLDL Cholesterol Cal: 12 mg/dL (ref 5–40)

## 2022-09-17 LAB — PSA TOTAL (REFLEX TO FREE): Prostate Specific Ag, Serum: 3.6 ng/mL (ref 0.0–4.0)

## 2022-09-17 LAB — MICROALBUMIN / CREATININE URINE RATIO
Creatinine, Urine: 105.7 mg/dL
Microalb/Creat Ratio: 155 mg/g creat — ABNORMAL HIGH (ref 0–29)
Microalbumin, Urine: 164.2 ug/mL

## 2022-09-22 ENCOUNTER — Other Ambulatory Visit (INDEPENDENT_AMBULATORY_CARE_PROVIDER_SITE_OTHER): Payer: Medicare Other

## 2022-09-22 ENCOUNTER — Encounter: Payer: Self-pay | Admitting: Internal Medicine

## 2022-09-22 DIAGNOSIS — Z0000 Encounter for general adult medical examination without abnormal findings: Secondary | ICD-10-CM

## 2022-09-22 DIAGNOSIS — E1169 Type 2 diabetes mellitus with other specified complication: Secondary | ICD-10-CM | POA: Diagnosis not present

## 2022-09-22 DIAGNOSIS — I119 Hypertensive heart disease without heart failure: Secondary | ICD-10-CM | POA: Diagnosis not present

## 2022-09-22 LAB — HEMOCCULT GUIAC POC 1CARD (OFFICE): Fecal Occult Blood, POC: NEGATIVE

## 2022-09-23 ENCOUNTER — Ambulatory Visit (INDEPENDENT_AMBULATORY_CARE_PROVIDER_SITE_OTHER): Payer: Medicare Other

## 2022-09-23 ENCOUNTER — Other Ambulatory Visit: Payer: Self-pay | Admitting: Cardiology

## 2022-09-23 ENCOUNTER — Other Ambulatory Visit: Payer: Self-pay | Admitting: Internal Medicine

## 2022-09-23 VITALS — BP 130/70 | HR 69 | Temp 98.5°F | Ht 63.6 in | Wt 181.0 lb

## 2022-09-23 DIAGNOSIS — Z0000 Encounter for general adult medical examination without abnormal findings: Secondary | ICD-10-CM | POA: Diagnosis not present

## 2022-09-23 NOTE — Progress Notes (Signed)
Subjective:   Thomas Rowland is a 67 y.o. male who presents for Medicare Annual/Subsequent preventive examination.  Review of Systems     Cardiac Risk Factors include: advanced age (>53men, >39 women);diabetes mellitus;hypertension;male gender;obesity (BMI >30kg/m2)     Objective:    Today's Vitals   09/23/22 1617  BP: 130/70  Pulse: 69  Temp: 98.5 F (36.9 C)  TempSrc: Oral  SpO2: 94%  Weight: 181 lb (82.1 kg)  Height: 5' 3.6" (1.615 m)   Body mass index is 31.46 kg/m.     09/23/2022    4:27 PM 10/02/2021    4:15 PM 10/23/2015   11:15 PM  Advanced Directives  Does Patient Have a Medical Advance Directive? No No No  Would patient like information on creating a medical advance directive? No - Patient declined  No - patient declined information    Current Medications (verified) Outpatient Encounter Medications as of 09/23/2022  Medication Sig   amLODipine (NORVASC) 10 MG tablet Take 1 tablet (10 mg total) by mouth daily.   aspirin 81 MG EC tablet Take 1 tablet by mouth daily.   atorvastatin (LIPITOR) 40 MG tablet Take 1 tablet (40 mg total) by mouth daily.   Cholecalciferol (VITAMIN D3) 25 MCG (1000 UT) CAPS Take 1,000 Units by mouth daily.   clopidogrel (PLAVIX) 75 MG tablet TAKE ONE TABLET BY MOUTH DAILY   hydrALAZINE (APRESOLINE) 25 MG tablet One tab po twice daily   loratadine (CLARITIN) 10 MG tablet Take 10 mg by mouth daily.   MAGNESIUM OXIDE PO Take 400 mg by mouth 2 (two) times daily.   metFORMIN (GLUCOPHAGE) 500 MG tablet Take 1 tablet (500 mg total) by mouth daily with breakfast.   metoprolol succinate (TOPROL-XL) 25 MG 24 hr tablet TAKE ONE TABLET BY MOUTH EVERY DAY **hold if blood pressure is less THAN 100 mmhg OR pulse is less THAN 60 beats PER minute**   Multiple Vitamin (ONE-DAILY MULTI VITAMINS PO) Take by mouth.   potassium chloride SA (KLOR-CON M) 20 MEQ tablet Take 1 tablet (20 mEq total) by mouth daily.   valsartan (DIOVAN) 160 MG tablet TAKE ONE  TABLET BY MOUTH TWICE DAILY (BEFORE breakfast AND dinner)   allopurinol (ZYLOPRIM) 100 MG tablet Take 1 tablet (100 mg total) by mouth daily as needed. (Patient not taking: Reported on 09/23/2022)   meloxicam (MOBIC) 15 MG tablet Take 1 tablet (15 mg total) by mouth daily. (Patient not taking: Reported on 09/23/2022)   MITIGARE 0.6 MG CAPS Take 1 capsule by mouth 3 (three) times daily as needed. (Patient not taking: Reported on 09/16/2022)   nitroGLYCERIN (NITROSTAT) 0.4 MG SL tablet Place 1 tablet (0.4 mg total) under the tongue every 5 (five) minutes as needed for chest pain. If you require more than two tablets five minutes apart go to the nearest ER via EMS.   sildenafil (REVATIO) 20 MG tablet TAKE ONE TABLET BY MOUTH EVERY DAY AS NEEDED   No facility-administered encounter medications on file as of 09/23/2022.    Allergies (verified) Lisinopril   History: Past Medical History:  Diagnosis Date   Coronary artery disease    Diabetes mellitus without complication (HCC)    History of COVID-19    Hyperlipidemia    Hypertension    Nonspecific abnormal electrocardiogram (ECG) (EKG) 06/26/2013   Past Surgical History:  Procedure Laterality Date   COLONOSCOPY  2014   Normal   CORONARY ANGIOPLASTY WITH STENT PLACEMENT     LEG SURGERY Left 1997  [place]   Family History  Family history unknown: Yes   Social History   Socioeconomic History   Marital status: Married    Spouse name: Not on file   Number of children: 3   Years of education: Not on file   Highest education level: Not on file  Occupational History    Employer: [employer]  Tobacco Use   Smoking status: Former    Packs/day: 0.25    Years: 30.00    Additional pack years: 0.00    Total pack years: 7.50    Types: Cigarettes, E-cigarettes    Start date: 1970    Quit date: 06/26/2001    Years since quitting: 21.2   Smokeless tobacco: Never  Vaping Use   Vaping Use: Never used  Substance and Sexual Activity   Alcohol use: Yes     Comment: occ   Drug use: No   Sexual activity: Not on file  Other Topics Concern   Not on file  Social History Narrative   Not on file   Social Determinants of Health   Financial Resource Strain: Low Risk  (09/23/2022)   Overall Financial Resource Strain (CARDIA)    Difficulty of Paying Living Expenses: Not hard at all  Food Insecurity: No Food Insecurity (09/23/2022)   Hunger Vital Sign    Worried About Running Out of Food in the Last Year: Never true    Ran Out of Food in the Last Year: Never true  Transportation Needs: No Transportation Needs (09/23/2022)   PRAPARE - Administrator, Civil Service (Medical): No    Lack of Transportation (Non-Medical): No  Physical Activity: Inactive (09/23/2022)   Exercise Vital Sign    Days of Exercise per Week: 0 days    Minutes of Exercise per Session: 0 min  Stress: No Stress Concern Present (09/23/2022)   [employer] of Occupational Health - Occupational Stress Questionnaire    Feeling of Stress : Not at all  Social Connections: Not on file    Tobacco Counseling Counseling given: Not Answered   Clinical Intake:  Pre-visit preparation completed: Yes  Pain : No/denies pain     Nutritional Status: BMI > 30  Obese Nutritional Risks: None Diabetes: Yes  How often do you need to have someone help you when you read instructions, pamphlets, or other written materials from your doctor or pharmacy?: 1 - Never  Diabetic? Yes Nutrition Risk Assessment:  Has the patient had any N/V/D within the last 2 months?  No  Does the patient have any non-healing wounds?  No  Has the patient had any unintentional weight loss or weight gain?  No   Diabetes:  Is the patient diabetic?  Yes  If diabetic, was a CBG obtained today?  No  Did the patient bring in their glucometer from home?  No  How often do you monitor your CBG's? Does not.   Financial Strains and Diabetes Management:  Are you having any financial strains with  the device, your supplies or your medication? No .  Does the patient want to be seen by Chronic Care Management for management of their diabetes?  No  Would the patient like to be referred to a Nutritionist or for Diabetic Management?  No   Diabetic Exams:  Diabetic Eye Exam: Overdue for diabetic eye exam. Pt has been advised about the importance in completing this exam. Patient advised to call and schedule an eye exam. Diabetic Foot Exam: Completed 09/16/2022   Interpreter Needed?:  No  Information entered by :: NAllen LPN   Activities of Daily Living    09/23/2022    4:28 PM 10/02/2021    4:20 PM  In your present state of health, do you have any difficulty performing the following activities:  Hearing? 0 0  Vision? 0 0  Difficulty concentrating or making decisions? 0 0  Walking or climbing stairs? 0 0  Dressing or bathing? 0 0  Doing errands, shopping? 0 0  Preparing Food and eating ? N N  Using the Toilet? N N  In the past six months, have you accidently leaked urine? N N  Do you have problems with loss of bowel control? N N  Managing your Medications? N N  Managing your Finances? N N  Housekeeping or managing your Housekeeping? N N    Patient Care Team: Dorothyann Peng, MD as PCP - General (Internal Medicine)  Indicate any recent Medical Services you may have received from other than [facility] providers in the past year (date may be approximate).     Assessment:   This is a routine wellness examination for Thomas Rowland.  Hearing/Vision screen Vision Screening - Comments:: No regular eye exams  Dietary issues and exercise activities discussed: Current Exercise Habits: The patient does not participate in regular exercise at present   Goals Addressed             This Visit's Progress    Patient Stated       09/23/2022, no goals       Depression Screen    09/23/2022    4:28 PM 09/16/2022    3:17 PM 05/18/2022    4:11 PM 10/02/2021    4:20 PM 12/18/2020    4:17 PM  09/12/2019    3:35 PM  PHQ 2/9 Scores  PHQ - 2 Score 0 0 0 0 0 0  PHQ- 9 Score     0     Fall Risk    09/23/2022    4:28 PM 09/16/2022    3:17 PM 05/18/2022    4:11 PM 10/02/2021    4:19 PM 12/18/2020    4:17 PM  Fall Risk   Falls in the past year? 0 0 0 0 0  Number falls in past yr: 0 0 0 0 0  Injury with Fall? 0 0 0 0 0  Risk for fall due to : Medication side effect No Fall Risks No Fall Risks Medication side effect   Follow up Falls prevention discussed;Education provided;Falls evaluation completed Falls evaluation completed Falls evaluation completed Falls evaluation completed;Education provided;Falls prevention discussed     FALL RISK PREVENTION PERTAINING TO THE HOME:  Any stairs in or around the home? No  If so, are there any without handrails? N/a Home free of loose throw rugs in walkways, pet beds, electrical cords, etc? No  Adequate lighting in your home to reduce risk of falls? No   ASSISTIVE DEVICES UTILIZED TO PREVENT FALLS:  Life alert? No  Use of a cane, walker or w/c? No  Grab bars in the bathroom? No  Shower chair or bench in shower? No  Elevated toilet seat or a handicapped toilet? No   TIMED UP AND GO:  Was the test performed? Yes .  Length of time to ambulate 10 feet: 5 sec.   Gait steady and fast without use of assistive device  Cognitive Function:        09/23/2022    4:29 PM  6CIT Screen  What Year? 0  points  What month? 0 points  What time? 0 points  Count back from 20 0 points  Months in reverse 0 points  Repeat phrase 10 points  Total Score 10 points    Immunizations Immunization History  Administered Date(s) Administered   Fluad Quad(high Dose 65+) 04/02/2021, 01/13/2022   Influenza, High Dose Seasonal PF 05/21/2020   PFIZER(Purple Top)SARS-COV-2 Vaccination 07/12/2019, 08/02/2019, 03/11/2020   PNEUMOCOCCAL CONJUGATE-20 09/03/2021   Pfizer Covid-19 Vaccine Bivalent Booster 65yrs & up 01/22/2021   Tdap 08/13/2020   Zoster  Recombinat (Shingrix) 09/30/2021    TDAP status: Up to date  Flu Vaccine status: Up to date  Pneumococcal vaccine status: Up to date  Covid-19 vaccine status: Completed vaccines  Qualifies for Shingles Vaccine? Yes   Zostavax completed No   Shingrix Completed?: needs second dose  Screening Tests Health Maintenance  Topic Date Due   Zoster Vaccines- Shingrix (2 of 2) 11/25/2021   COVID-19 Vaccine (5 - 2023-24 season) 12/26/2021   OPHTHALMOLOGY EXAM  09/19/2022   Medicare Annual Wellness (AWV)  10/03/2022   INFLUENZA VACCINE  11/26/2022   Colonoscopy  02/04/2023   HEMOGLOBIN A1C  03/19/2023   Diabetic kidney evaluation - eGFR measurement  09/16/2023   Diabetic kidney evaluation - Urine ACR  09/16/2023   FOOT EXAM  09/16/2023   DTaP/Tdap/Td (2 - Td or Tdap) 08/14/2030   Pneumonia Vaccine 38+ Years old  Completed   Hepatitis C Screening  Completed   HPV VACCINES  Aged Out    Health Maintenance  Health Maintenance Due  Topic Date Due   Zoster Vaccines- Shingrix (2 of 2) 11/25/2021   COVID-19 Vaccine (5 - 2023-24 season) 12/26/2021   OPHTHALMOLOGY EXAM  09/19/2022   Medicare Annual Wellness (AWV)  10/03/2022    Colorectal cancer screening: Type of screening: Colonoscopy. Completed 02/03/2013. Repeat every 10 years  Lung Cancer Screening: (Low Dose CT Chest recommended if Age 73-80 years, 30 pack-year currently smoking OR have quit w/in 15years.) does not qualify.   Lung Cancer Screening Referral: no  Additional Screening:  Hepatitis C Screening: does qualify; Completed 09/12/2019  Vision Screening: Recommended annual ophthalmology exams for early detection of glaucoma and other disorders of the eye. Is the patient up to date with their annual eye exam?  No  Who is the provider or what is the name of the office in which the patient attends annual eye exams? none If pt is not established with a provider, would they like to be referred to a provider to establish care?  No .   Dental Screening: Recommended annual dental exams for proper oral hygiene  Community Resource Referral / Chronic Care Management: CRR required this visit?  No   CCM required this visit?  No      Plan:     I have personally reviewed and noted the following in the patient's chart:   Medical and social history Use of alcohol, tobacco or illicit drugs  Current medications and supplements including opioid prescriptions. Patient is not currently taking opioid prescriptions. Functional ability and status Nutritional status Physical activity Advanced directives List of other physicians Hospitalizations, surgeries, and ER visits in previous 12 months Vitals Screenings to include cognitive, depression, and falls Referrals and appointments  In addition, I have reviewed and discussed with patient certain preventive protocols, quality metrics, and best practice recommendations. A written personalized care plan for preventive services as well as general preventive health recommendations were provided to patient.     Barb Merino,  LPN   [id number]   Nurse Notes: none

## 2022-09-23 NOTE — Patient Instructions (Signed)
Thomas Rowland , Thank you for taking time to come for your Medicare Wellness Visit. I appreciate your ongoing commitment to your health goals. Please review the following plan we discussed and let me know if I can assist you in the future.   These are the goals we discussed:  Goals      Patient Stated     10/02/2021, wants to get health issues under control and live a healthy and long life     Patient Stated     09/23/2022, no goals        This is a list of the screening recommended for you and due dates:  Health Maintenance  Topic Date Due   Zoster (Shingles) Vaccine (2 of 2) 11/25/2021   COVID-19 Vaccine (5 - 2023-24 season) 12/26/2021   Eye exam for diabetics  09/19/2022   Flu Shot  11/26/2022   Colon Cancer Screening  02/04/2023   Hemoglobin A1C  03/19/2023   Yearly kidney function blood test for diabetes  09/16/2023   Yearly kidney health urinalysis for diabetes  09/16/2023   Complete foot exam   09/16/2023   Medicare Annual Wellness Visit  09/23/2023   DTaP/Tdap/Td vaccine (2 - Td or Tdap) 08/14/2030   Pneumonia Vaccine  Completed   Hepatitis C Screening  Completed   HPV Vaccine  Aged Out    Advanced directives: Advance directive discussed with you today.   Conditions/risks identified: none  Next appointment: Follow up in one year for your annual wellness visit.   Preventive Care 68 Years and Older, Male  Preventive care refers to lifestyle choices and visits with your health care provider that can promote health and wellness. What does preventive care include? A yearly physical exam. This is also called an annual well check. Dental exams once or twice a year. Routine eye exams. Ask your health care provider how often you should have your eyes checked. Personal lifestyle choices, including: Daily care of your teeth and gums. Regular physical activity. Eating a healthy diet. Avoiding tobacco and drug use. Limiting alcohol use. Practicing safe sex. Taking low  doses of aspirin every day. Taking vitamin and mineral supplements as recommended by your health care provider. What happens during an annual well check? The services and screenings done by your health care provider during your annual well check will depend on your age, overall health, lifestyle risk factors, and family history of disease. Counseling  Your health care provider may ask you questions about your: Alcohol use. Tobacco use. Drug use. Emotional well-being. Home and relationship well-being. Sexual activity. Eating habits. History of falls. Memory and ability to understand (cognition). Work and work Astronomer. Screening  You may have the following tests or measurements: Height, weight, and BMI. Blood pressure. Lipid and cholesterol levels. These may be checked every 5 years, or more frequently if you are over 68 years old. Skin check. Lung cancer screening. You may have this screening every year starting at age 33 if you have a 30-pack-year history of smoking and currently smoke or have quit within the past 15 years. Fecal occult blood test (FOBT) of the stool. You may have this test every year starting at age 32. Flexible sigmoidoscopy or colonoscopy. You may have a sigmoidoscopy every 5 years or a colonoscopy every 10 years starting at age 67. Prostate cancer screening. Recommendations will vary depending on your family history and other risks. Hepatitis C blood test. Hepatitis B blood test. Sexually transmitted disease (STD) testing. Diabetes screening. This is  done by checking your blood sugar (glucose) after you have not eaten for a while (fasting). You may have this done every 1-3 years. Abdominal aortic aneurysm (AAA) screening. You may need this if you are a current or former smoker. Osteoporosis. You may be screened starting at age 49 if you are at high risk. Talk with your health care provider about your test results, treatment options, and if necessary, the need  for more tests. Vaccines  Your health care provider may recommend certain vaccines, such as: Influenza vaccine. This is recommended every year. Tetanus, diphtheria, and acellular pertussis (Tdap, Td) vaccine. You may need a Td booster every 10 years. Zoster vaccine. You may need this after age 23. Pneumococcal 13-valent conjugate (PCV13) vaccine. One dose is recommended after age 32. Pneumococcal polysaccharide (PPSV23) vaccine. One dose is recommended after age 24. Talk to your health care provider about which screenings and vaccines you need and how often you need them. This information is not intended to replace advice given to you by your health care provider. Make sure you discuss any questions you have with your health care provider. Document Released: 05/10/2015 Document Revised: 01/01/2016 Document Reviewed: 02/12/2015 Elsevier Interactive Patient Education  2017 [employer].  Fall Prevention in the Home Falls can cause injuries. They can happen to people of all ages. There are many things you can do to make your home safe and to help prevent falls. What can I do on the outside of my home? Regularly fix the edges of walkways and driveways and fix any cracks. Remove anything that might make you trip as you walk through a door, such as a raised step or threshold. Trim any bushes or trees on the path to your home. Use bright outdoor lighting. Clear any walking paths of anything that might make someone trip, such as rocks or tools. Regularly check to see if handrails are loose or broken. Make sure that both sides of any steps have handrails. Any raised decks and porches should have guardrails on the edges. Have any leaves, snow, or ice cleared regularly. Use sand or salt on walking paths during winter. Clean up any spills in your garage right away. This includes oil or grease spills. What can I do in the bathroom? Use night lights. Install grab bars by the toilet and in the tub and  shower. Do not use towel bars as grab bars. Use non-skid mats or decals in the tub or shower. If you need to sit down in the shower, use a plastic, non-slip stool. Keep the floor dry. Clean up any water that spills on the floor as soon as it happens. Remove soap buildup in the tub or shower regularly. Attach bath mats securely with double-sided non-slip rug tape. Do not have throw rugs and other things on the floor that can make you trip. What can I do in the bedroom? Use night lights. Make sure that you have a light by your bed that is easy to reach. Do not use any sheets or blankets that are too big for your bed. They should not hang down onto the floor. Have a firm chair that has side arms. You can use this for support while you get dressed. Do not have throw rugs and other things on the floor that can make you trip. What can I do in the kitchen? Clean up any spills right away. Avoid walking on wet floors. Keep items that you use a lot in easy-to-reach places. If you need  to reach something above you, use a strong step stool that has a grab bar. Keep electrical cords out of the way. Do not use floor polish or wax that makes floors slippery. If you must use wax, use non-skid floor wax. Do not have throw rugs and other things on the floor that can make you trip. What can I do with my stairs? Do not leave any items on the stairs. Make sure that there are handrails on both sides of the stairs and use them. Fix handrails that are broken or loose. Make sure that handrails are as long as the stairways. Check any carpeting to make sure that it is firmly attached to the stairs. Fix any carpet that is loose or worn. Avoid having throw rugs at the top or bottom of the stairs. If you do have throw rugs, attach them to the floor with carpet tape. Make sure that you have a light switch at the top of the stairs and the bottom of the stairs. If you do not have them, ask someone to add them for  you. What else can I do to help prevent falls? Wear shoes that: Do not have high heels. Have rubber bottoms. Are comfortable and fit you well. Are closed at the toe. Do not wear sandals. If you use a stepladder: Make sure that it is fully opened. Do not climb a closed stepladder. Make sure that both sides of the stepladder are locked into place. Ask someone to hold it for you, if possible. Clearly mark and make sure that you can see: Any grab bars or handrails. First and last steps. Where the edge of each step is. Use tools that help you move around (mobility aids) if they are needed. These include: Canes. Walkers. Scooters. Crutches. Turn on the lights when you go into a dark area. Replace any light bulbs as soon as they burn out. Set up your furniture so you have a clear path. Avoid moving your furniture around. If any of your floors are uneven, fix them. If there are any pets around you, be aware of where they are. Review your medicines with your doctor. Some medicines can make you feel dizzy. This can increase your chance of falling. Ask your doctor what other things that you can do to help prevent falls. This information is not intended to replace advice given to you by your health care provider. Make sure you discuss any questions you have with your health care provider. Document Released: 02/07/2009 Document Revised: 09/19/2015 Document Reviewed: 05/18/2014 Elsevier Interactive Patient Education  2017 [employer].

## 2022-09-29 ENCOUNTER — Ambulatory Visit: Payer: Medicare Other

## 2022-10-02 ENCOUNTER — Ambulatory Visit: Payer: Medicare Other

## 2022-10-08 ENCOUNTER — Other Ambulatory Visit: Payer: Self-pay | Admitting: Internal Medicine

## 2022-10-23 ENCOUNTER — Ambulatory Visit (HOSPITAL_COMMUNITY)
Admission: EM | Admit: 2022-10-23 | Discharge: 2022-10-23 | Disposition: A | Discharge status: Home / Self Care | Payer: Medicare Other

## 2022-10-23 ENCOUNTER — Encounter (HOSPITAL_COMMUNITY): Payer: Self-pay

## 2022-10-23 DIAGNOSIS — H5713 Ocular pain, bilateral: Secondary | ICD-10-CM

## 2022-10-23 DIAGNOSIS — B349 Viral infection, unspecified: Secondary | ICD-10-CM | POA: Diagnosis not present

## 2022-10-23 LAB — HM DIABETES EYE EXAM

## 2022-10-23 NOTE — ED Triage Notes (Signed)
Pt is here for sore throat.  Pt reports headache and body aches x 3 days. Pt reports eyes are swollen.

## 2022-10-23 NOTE — Discharge Instructions (Addendum)
Use tylenol 650 mg every 4-6 hours for aches You can use warm salt water gargles, over the counter throat sprays, or throat lozenges for sore throat Drink lots of fluids Allow several days to a week for symptoms to improve and resolve  STOP using the eye drops Call your eye doctor for follow up  Continue taking all other medications as prescribed   You are contagious. Please take precautions to avoid getting others sick

## 2022-10-23 NOTE — ED Provider Notes (Signed)
[facility]    CSN: [id number] Arrival date & time: 10/23/22  4098      History   Chief Complaint Chief Complaint  Patient presents with   Conjunctivitis   Facial Swelling   Sore Throat   Headache    HPI Thomas Rowland is a 67 y.o. male.  Son helps with some translation per patient request  Sore throat, mild headache, body aches x 3 days Rates discomfort 3 or 4 out of 10 No fever, chills, congestion, cough. No abd pain, NVD. Eating and drinking normally No known sick contacts but has been around a lot of people Tried nyquil once last night, no other interventions  Also reports 2 week history of eye discomfort. A little stinging. No drainage or discharge. No vision changes. He has been using latanoprost drops every night for the last year or so. Prescribed by his eye doc. Reports the stinging increases after using the drops.  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 Authorizing Provider  amLODipine (NORVASC) 10 MG tablet Take 1 tablet (10 mg total) by mouth daily. 08/24/22  Yes Dorothyann Peng, MD  aspirin  81 MG EC tablet Take 1 tablet by mouth daily.   Yes [provider]  atorvastatin (LIPITOR) 40 MG tablet Take 1 tablet (40 mg total) by mouth daily. 10/12/22  Yes Dorothyann Peng, MD  Cholecalciferol (VITAMIN D3) 25 MCG (1000 UT) CAPS Take 1,000 Units by mouth daily.   Yes [provider]  clopidogrel (PLAVIX) 75 MG tablet TAKE ONE TABLET BY MOUTH DAILY 09/23/22  Yes Tolia, Sunit, DO  hydrALAZINE (APRESOLINE) 25 MG tablet One tab po twice daily 09/16/22  Yes Dorothyann Peng, MD  loratadine (CLARITIN) 10 MG tablet Take 10 mg by mouth daily.   Yes [provider]  MAGNESIUM OXIDE PO Take 400 mg by mouth 2 (two) times daily.   Yes [provider]  metFORMIN (GLUCOPHAGE) 500 MG tablet Take 1 tablet (500 mg total) by mouth daily with breakfast. 10/06/21  Yes Dorothyann Peng, MD  metoprolol succinate (TOPROL-XL) 25 MG 24 hr tablet TAKE ONE TABLET BY MOUTH EVERY DAY **hold if blood pressure is less THAN 100 mmhg OR pulse is less THAN 60 beats PER minute** 09/16/22  Yes Dorothyann Peng, MD  MITIGARE 0.6 MG CAPS Take 1 capsule by mouth 3 (three) times daily as needed. 10/06/21  Yes Dorothyann Peng, MD  Multiple Vitamin (ONE-DAILY MULTI VITAMINS PO) Take by mouth.   Yes [provider]  potassium chloride SA (KLOR-CON M) 20 MEQ tablet Take 1 tablet (20 mEq total) by  mouth daily. 09/24/22  Yes Dorothyann Peng, MD  sildenafil (REVATIO) 20 MG tablet TAKE ONE TABLET BY MOUTH EVERY DAY AS NEEDED 11/20/21  Yes Dorothyann Peng, MD  valsartan (DIOVAN) 160 MG tablet TAKE ONE TABLET BY MOUTH TWICE DAILY (BEFORE breakfast AND dinner) 09/16/22  Yes Dorothyann Peng, MD  nitroGLYCERIN (NITROSTAT) 0.4 MG SL tablet Place 1 tablet (0.4 mg total) under the tongue every 5 (five) minutes as needed for chest pain. If you require more than two tablets five minutes apart go to the nearest ER via EMS. 01/13/22 02/17/22  Dorothyann Peng, MD    Family History Family History  Family history unknown: Yes     Social History Social History   Tobacco Use   Smoking status: Former    Packs/day: 0.25    Years: 30.00    Additional pack years: 0.00    Total pack years: 7.50    Types: Cigarettes, E-cigarettes    Start date: 1970    Quit date: 06/26/2001    Years since quitting: 21.3   Smokeless tobacco: Never  Vaping Use   Vaping Use: Never used  Substance Use Topics   Alcohol use: Yes    Comment: occ   Drug use: No     Allergies   Lisinopril   Review of Systems Review of Systems As per HPI  Physical Exam Triage Vital Signs ED Triage Vitals [10/23/22 1039]  Enc Vitals Group     BP (!) 156/80     Pulse Rate 70     Resp 18     Temp 97.8 F (36.6 C)     Temp Source Oral     SpO2 93 %     Weight      Height      Head Circumference      Peak Flow      Pain Score      Pain Loc      Pain Edu?      Excl. in GC?    No data found.  Updated Vital Signs BP (!) 156/80 (BP Location: Left Arm)   Pulse 70   Temp 97.8 F (36.6 C) (Oral)   Resp 18   SpO2 93%    Physical Exam Vitals and nursing note reviewed.  Constitutional:      General: He is not in acute distress.    Appearance: He is not ill-appearing or diaphoretic.  HENT:     Right Ear: Tympanic membrane and ear canal normal.     Left Ear: Tympanic membrane and ear canal normal.     Nose: Rhinorrhea (mild) present. No congestion.     Mouth/Throat:     Mouth: Mucous membranes are moist.     Pharynx: Oropharynx is clear. No pharyngeal swelling, oropharyngeal exudate, posterior oropharyngeal erythema or uvula swelling.     Tonsils: 0 on the right. 0 on the left.     Comments: Clear phonation. Tolerating secretions  Eyes:     General: Lids are normal. Vision grossly intact. Gaze aligned appropriately. No scleral icterus.    Extraocular Movements: Extraocular movements intact.     Conjunctiva/sclera: Conjunctivae normal.     Right eye: Right conjunctiva is not injected.     Left eye: Left conjunctiva is not  injected.     Pupils: Pupils are equal, round, and reactive to light.     Comments: Left eye pterygium. No sign of foreign body. No discharge, drainage, or crusting.   Cardiovascular:     Rate  and Rhythm: Normal rate and regular rhythm.     Heart sounds: Normal heart sounds.  Pulmonary:     Effort: Pulmonary effort is normal.     Breath sounds: Normal breath sounds.  Abdominal:     Palpations: Abdomen is soft.     Tenderness: There is no abdominal tenderness.  Musculoskeletal:        General: Normal range of motion.     Cervical back: Normal range of motion. No rigidity or tenderness.  Lymphadenopathy:     Cervical: No cervical adenopathy.  Skin:    General: Skin is warm and dry.  Neurological:     General: No focal deficit present.     Mental Status: He is alert and oriented to person, place, and time. Mental status is at baseline.     UC Treatments / Results  Labs (all labs ordered are listed, but only abnormal results are displayed) Labs Reviewed - No data to display  EKG  Radiology No results found.  Procedures Procedures   Medications Ordered in UC Medications - No data to display  Initial Impression / Assessment and Plan / UC Course  I have reviewed the triage vital signs and the nursing notes.  Pertinent labs & imaging results that were available during my care of the patient were reviewed by me and considered in my medical decision making (see chart for details).  Afebrile, well appearing. No red flags Suspect viral etiology. Defer testing today. No strep testing indicated. Advised symptomatic care with tylenol, salt water gargles, throat lozenges. Son was asking about antibiotics, we discussed why that is not warranted. Advised to discontinue his eye drops and to please call his eye specialist for follow up. If symptoms worsen after using the drops we discussed not to use them. No sign of infection requiring abx drops at this time. Return precautions  discussed. Patient agrees to plan, all questions answered  Final Clinical Impressions(s) / UC Diagnoses   Final diagnoses:  Viral illness  Eye discomfort, bilateral     Discharge Instructions      Use tylenol 650 mg every 4-6 hours for aches You can use warm salt water gargles, over the counter throat sprays, or throat lozenges for sore throat Drink lots of fluids Allow several days to a week for symptoms to improve and resolve  STOP using the eye drops Call your eye doctor for follow up  Continue taking all other medications as prescribed   You are contagious. Please take precautions to avoid getting others sick      ED Prescriptions   None    PDMP not reviewed this encounter.   Maybelline Kolarik, Lurena Joiner, [place] 10/23/22 1322

## 2022-11-03 ENCOUNTER — Encounter: Payer: Self-pay | Admitting: Internal Medicine

## 2022-11-11 ENCOUNTER — Other Ambulatory Visit: Payer: Self-pay | Admitting: Internal Medicine

## 2022-11-16 ENCOUNTER — Other Ambulatory Visit: Payer: Self-pay | Admitting: Internal Medicine

## 2022-11-16 DIAGNOSIS — I119 Hypertensive heart disease without heart failure: Secondary | ICD-10-CM

## 2022-12-17 ENCOUNTER — Other Ambulatory Visit: Payer: Self-pay | Admitting: Internal Medicine

## 2022-12-17 DIAGNOSIS — I2510 Atherosclerotic heart disease of native coronary artery without angina pectoris: Secondary | ICD-10-CM

## 2022-12-17 DIAGNOSIS — I119 Hypertensive heart disease without heart failure: Secondary | ICD-10-CM

## 2023-01-18 ENCOUNTER — Ambulatory Visit: Payer: Medicare Other | Admitting: Internal Medicine

## 2023-02-01 ENCOUNTER — Other Ambulatory Visit: Payer: Self-pay | Admitting: Internal Medicine

## 2023-02-01 DIAGNOSIS — I119 Hypertensive heart disease without heart failure: Secondary | ICD-10-CM

## 2023-02-04 ENCOUNTER — Other Ambulatory Visit: Payer: Self-pay | Admitting: Internal Medicine

## 2023-02-04 DIAGNOSIS — I119 Hypertensive heart disease without heart failure: Secondary | ICD-10-CM

## 2023-02-11 ENCOUNTER — Other Ambulatory Visit: Payer: Self-pay | Admitting: Internal Medicine

## 2023-02-11 DIAGNOSIS — I119 Hypertensive heart disease without heart failure: Secondary | ICD-10-CM

## 2023-02-18 ENCOUNTER — Ambulatory Visit: Payer: PPO | Admitting: Cardiology

## 2023-03-02 ENCOUNTER — Ambulatory Visit: Payer: Medicare Other | Attending: Cardiology | Admitting: Cardiology

## 2023-03-02 ENCOUNTER — Encounter: Payer: Self-pay | Admitting: Cardiology

## 2023-03-02 ENCOUNTER — Other Ambulatory Visit (HOSPITAL_COMMUNITY): Payer: Self-pay

## 2023-03-02 VITALS — BP 132/84 | HR 80 | Resp 16 | Ht 63.0 in | Wt 187.2 lb

## 2023-03-02 DIAGNOSIS — E782 Mixed hyperlipidemia: Secondary | ICD-10-CM

## 2023-03-02 DIAGNOSIS — I2510 Atherosclerotic heart disease of native coronary artery without angina pectoris: Secondary | ICD-10-CM

## 2023-03-02 DIAGNOSIS — I10 Essential (primary) hypertension: Secondary | ICD-10-CM

## 2023-03-02 DIAGNOSIS — Z955 Presence of coronary angioplasty implant and graft: Secondary | ICD-10-CM

## 2023-03-02 DIAGNOSIS — E119 Type 2 diabetes mellitus without complications: Secondary | ICD-10-CM | POA: Diagnosis not present

## 2023-03-02 DIAGNOSIS — Z87891 Personal history of nicotine dependence: Secondary | ICD-10-CM

## 2023-03-02 DIAGNOSIS — Z8639 Personal history of other endocrine, nutritional and metabolic disease: Secondary | ICD-10-CM | POA: Diagnosis not present

## 2023-03-02 MED ORDER — EPLERENONE 25 MG PO TABS
25.0000 mg | ORAL_TABLET | Freq: Every day | ORAL | 0 refills | Status: DC
Start: 2023-03-02 — End: 2023-03-02
  Filled 2023-03-02: qty 30, 30d supply, fill #0

## 2023-03-02 MED ORDER — EPLERENONE 25 MG PO TABS: 25.0000 mg | ORAL_TABLET | Freq: Every day | ORAL | 0 refills | Status: DC

## 2023-03-02 NOTE — Patient Instructions (Addendum)
Medication Instructions:  Your physician has recommended you make the following change in your medication:   STOP Potassium  START Eplerenone 25mg  by mouth daily    *If you need a refill on your cardiac medications before your next appointment, please call your pharmacy*  Lab Work: BMP to be completed in 1 week  If you have labs (blood work) drawn today and your tests are completely normal, you will receive your results only by: MyChart Message (if you have MyChart) OR A paper copy in the mail If you have any lab test that is abnormal or we need to change your treatment, we will call you to review the results.  Testing/Procedures: Your physician has requested that you have an echocardiogram prior to your next follow-up with Dr. Odis Hollingshead in 6 months. Echocardiography is a painless test that uses sound waves to create images of your heart. It provides your doctor with information about the size and shape of your heart and how well your heart's chambers and valves are working. This procedure takes approximately one hour. There are no restrictions for this procedure. Please do NOT wear cologne, perfume, aftershave, or lotions (deodorant is allowed). Please arrive 15 minutes prior to your appointment time.  Please note: We ask at that you not bring children with you during ultrasound (echo/ vascular) testing. Due to room size and safety concerns, children are not allowed in the ultrasound rooms during exams. Our front office staff cannot provide observation of children in our lobby area while testing is being conducted. An adult accompanying a patient to their appointment will only be allowed in the ultrasound room at the discretion of the ultrasound technician under special circumstances. We apologize for any inconvenience.   Follow-Up: At [facility], you and your health needs are our priority.  As part of our continuing mission to provide you with exceptional heart care, we have created  designated Provider Care Teams.  These Care Teams include your primary Cardiologist (physician) and Advanced Practice Providers (APPs -  Physician Assistants and Nurse Practitioners) who all work together to provide you with the care you need, when you need it.   Your next appointment:   6 month(s)  The format for your next appointment:   In Person  Provider:   Tessa Lerner, DO {

## 2023-03-02 NOTE — Progress Notes (Signed)
Cardiology Office Note:  .   Date:  03/02/2023  ID:  Thomas Rowland, DOB 04-24-1956, MRN [id number] PCP:  Dorothyann Peng, MD  Former Cardiology Providers: Dr. Yates Decamp and Dr. Jordan Hawks.  [facility] Providers Cardiologist:  Tessa Lerner, [place] , [facility] (established care June 2021) Electrophysiologist:  None  Click to update primary MD,subspecialty MD or APP then REFRESH:1}    Chief Complaint  Patient presents with   Atherosclerosis of native coronary artery of native heart w   Follow-up    History of Present Illness: Thomas Rowland is a 67 y.o. 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   In March 2020 while he was seeing Dr. Kennon Rounds he had an abnormal nuclear stress test and underwent elective angiography and was found to have obstructive CAD.  He has had coronary interventions to both the RCA (2 PCI) and the LCx (1 PCI).   He reestablished care in June 2021 and since then has been treated medically.  In May 2022 he had a stress test which was reported to be high risk due to reduced LVEF by gated SPECT, dilated LV cavity, and presence of both fixed and possible small size reversible perfusion defect.  After long discussion with regards to angiography versus medical management patient preferred medical therapy for now as he was asymptomatic.  Since May 2022 patient has not had anginal chest pain or the need for angiography.  He presents today for 74-month follow-up visit.  He denies anginal chest pain or heart concerns.  No hospitalizations or urgent care visits since last office encounter.  Overall functional capacity remains stable.  Home blood pressures when checked are between  130-140 mmHg.  Review of Systems: .   Review of Systems  Cardiovascular:  Negative for chest pain, claudication, irregular heartbeat, leg swelling, near-syncope, orthopnea, palpitations, paroxysmal nocturnal dyspnea and syncope.  Respiratory:  Negative for shortness of breath.   Hematologic/Lymphatic: Negative for bleeding problem.    Studies Reviewed:   EKG: EKG Interpretation Date/Time:  Tuesday March 02 2023 16:11:33 EST Ventricular Rate:  80 PR Interval:  160 QRS Duration:  78 QT Interval:  380 QTC Calculation: 438 R Axis:   13  Text Interpretation: Normal sinus rhythm T wave abnormality, consider lateral ischemia When compared with ECG of 11-May-2018 17:03, Vent. rate has increased BY  28 BPM T wave inversion no longer evident in Inferior leads T wave inversion no longer evident in Anterior leads T wave inversion more evident in Lateral leads Confirmed by Tessa Lerner [phone]) on 03/02/2023 4:15:00 PM  Echocardiogram: 10/20/2019: LVEF 50-55%, moderate LVH, indeterminate diastolic filling pattern, mildly dilated left atrium, mild AR, mild MR, mild TR, mild PR.   Stress Testing: Lexiscan Sestamibi stress test 09/11/2020: Small size, severe intensity, fixed perfusion defect consistent with prior infarct in the RCA distribution. Small size, mild intensity, reversible perfusion defect consistent with either reversible ischemia or peri-infarct ischemia in the LCx / RCA distribution. Severely reduced LVEF, calculated LVEF 24%.  Wall motion illustrates Global hypokinesia with hypokinetic inferior segments.  High risk study (severely reduced LVEF, dilated LV, presence of both fixed and small size perfusion defect). Clinical correlation required.  Compared to prior outside records from 06/15/2019: Noted potential small area of pharmacologically induced ischemia involving the mid  aspect of the inferior wall of the left ventricle. LVEF 50%, mild hypokinesis involving the inferior wall.     Heart Catheterization: 07/15/2018 LHC with Dr. Theron Arista at [facility] (care everywhere): "Angiography revealed mild LAD, 80% mLCx, 95% mRCA. Stented RCA with 3.5x18 and 3.5x12 Onyx DES with good angiographic result. Stented LCX with a 3.5x15 Onyx DES with good angiographic result."  RADIOLOGY: N/A  Risk Assessment/Calculations:   N/A   Labs:       Latest Ref Rng & Units 09/16/2022    4:29 PM 05/18/2022    5:10 PM 09/03/2021    4:49 PM  CBC  WBC 3.4 - 10.8 x10E3/uL 7.6  7.2  8.1   Hemoglobin 13.0 - 17.7 g/dL 32.4  40.1  02.7   Hematocrit 37.5 - 51.0 % 39.6  38.4  39.4   Platelets 150 - 450 x10E3/uL 152  169  179        Latest Ref Rng & Units 09/16/2022    4:29 PM 05/18/2022    5:10 PM 03/03/2022    4:01 PM  BMP  Glucose 70 - 99 mg/dL 98  83  253   BUN 8 - 27 mg/dL 16  19  19    Creatinine 0.76 - 1.27 mg/dL 6.64  4.03  4.74   BUN/Creat Ratio 10 - 24 13  15  15    Sodium 134 - 144 mmol/L 141  140  140   Potassium 3.5 - 5.2 mmol/L 4.8  4.4  4.3   Chloride 96 - 106 mmol/L 104  104  103   CO2 20 - 29 mmol/L 23  20  24    Calcium 8.6 - 10.2 mg/dL 9.6  9.0  9.1       Latest Ref Rng & Units 09/16/2022    4:29 PM 05/18/2022    5:10 PM 03/03/2022    4:01 PM  CMP  Glucose 70 - 99 mg/dL 98  83  259   BUN 8 - 27 mg/dL 16  19  19    Creatinine 0.76 - 1.27 mg/dL 5.63  8.75  6.43   Sodium 134 - 144 mmol/L 141  140  140   Potassium 3.5 - 5.2 mmol/L 4.8  4.4  4.3   Chloride 96 - 106 mmol/L 104  104  103   CO2 20 - 29 mmol/L 23  20  24    Calcium 8.6 - 10.2 mg/dL 9.6  9.0  9.1   Total Protein 6.0 - 8.5 g/dL 6.5  6.4    6.5    Total Bilirubin 0.0 - 1.2 mg/dL 0.3  0.2    Alkaline Phos 44 - 121 IU/L 69  66    AST 0 - 40 IU/L 20  23    ALT 0 - 44 IU/L 16  22      Lab Results  Component Value Date   CHOL 112 09/16/2022   HDL 49 09/16/2022   LDLCALC 51 09/16/2022   TRIG 49 09/16/2022   CHOLHDL 2.3 09/16/2022   No results for input(s): "LIPOA" in the last 8760 hours. No components found for:  "NTPROBNP" No results for input(s): "PROBNP" in the last 8760 hours. No results for input(s): "TSH" in the last 8760 hours.   Physical Exam:    Today's Vitals   03/02/23 1614  BP: 132/84  Pulse: 80  Resp: 16  SpO2: 95%  Weight: 187 lb 3.2 oz (84.9 kg)  Height: 5\' 3"  (1.6 m)   Body mass index is 33.16 kg/m.  Wt Readings from Last 3 Encounters:  03/02/23 187 lb 3.2 oz (84.9 kg)  09/23/22 181 lb (82.1 kg)  09/16/22 181 lb 6.4 oz (82.3 kg)    Physical Exam  Constitutional: No distress.  Age appropriate, hemodynamically stable.   Neck: No JVD present.  Cardiovascular: Normal rate, regular rhythm, S1 normal, S2 normal, intact distal pulses and normal pulses. Exam reveals no gallop, no S3, no S4 and no friction rub.  No murmur heard. Pulses:      Dorsalis pedis pulses are 2+ on the right side and 2+ on the left side.       Posterior tibial pulses are 2+ on the right side and 2+ on the left side.  Pulmonary/Chest: Effort normal and breath sounds normal. No stridor. He has no wheezes. He has no rales. He exhibits no tenderness.  Abdominal: Soft. Bowel sounds are normal. He exhibits no distension. There is no abdominal tenderness.  Musculoskeletal:        General: No tenderness or edema.     Cervical back: Neck supple.  Neurological: He is alert and oriented to person, place, and time. He has intact cranial nerves (2-12).  Skin: Skin is warm and moist.     Impression & Recommendation(s):  Impression:   ICD-10-CM   1. Atherosclerosis of native coronary artery of native heart without angina pectoris  I25.10 EKG 12-Lead    ECHOCARDIOGRAM COMPLETE    CANCELED: ECHOCARDIOGRAM COMPLETE    2. History of coronary angioplasty with insertion of stent  Z95.5     3. Essential hypertension  I10 Basic metabolic panel    Basic metabolic panel    eplerenone (INSPRA) 25 MG tablet    DISCONTINUED: eplerenone (INSPRA) 25 MG tablet    4. Mixed hyperlipidemia  E78.2     5. Non-insulin  dependent type 2 diabetes mellitus (HCC)  E11.9     6. Hx of hyperaldosteronism  Z86.39     7. Former smoker  Z87.891        Recommendation(s):  Atherosclerosis of native coronary artery of native heart without angina pectoris History of coronary angioplasty with insertion of stent Denies angina pectoris. EKG does not illustrate myocardial injury pattern. No use of sublingual nitroglycerin tablets since the last office visit. Medications reconciled. Echo in June 2021: Preserved LVEF, indeterminate diastolic filling pattern, mild valvular heart disease. MPI May 2022: Study due to dilated LV cavity, reduced LVEF by gated SPECT image, and presence of this and small size reversible perfusion defect.  He has remained asymptomatic since 2022 with regards to chest pain or anginal equivalents. LDL 51 mg/dL as of May 2024 Reemphasized the importance of secondary prevention with focus on improving her modifiable cardiovascular risk factors such as glycemic control, lipid management, blood pressure control, weight loss.  Essential hypertension Hx of hyperaldosteronism Office blood pressures are acceptable but not at goal. Home blood pressures are between 130-140 mmHg. Will start eplerenone 25 mg p.o. daily. Stop potassium when the eplerenone is initiated. Check a BMP in 1 week to reevaluate renal function and potassium levels.  Mixed hyperlipidemia Currently on Lipitor 40 mg p.o. daily.   He denies myalgia or other side effects. Most recent lipids dated May 2024, independently reviewed as noted above.   LDL at goal at 51 mg/dL  Non-insulin dependent type 2 diabetes mellitus (HCC) Hemoglobin A1c 5.6 as of May 2024. Given diabetes and CAD with prior coronary interventions we discussed the role of Ozempic.  Patient would like to hold off  on Ozempic for now as he is not entertaining the idea of subcutaneous administration once a week.  I have asked him to discuss this further with PCP. Continue  statin therapy, ARB. May consider initiation of Jardiance for both renal and cardioprotective properties. Currently being managed by PCP  Orders Placed:  Orders Placed This Encounter  Procedures   Basic metabolic panel    Standing Status:   Future    Number of Occurrences:   1    Standing Expiration Date:   03/01/2024   EKG 12-Lead   ECHOCARDIOGRAM COMPLETE    Standing Status:   Future    Standing Expiration Date:   03/01/2024    Scheduling Instructions:     Needs to be completed prior to 6 month f/u with Dr. Odis Hollingshead    Order Specific Question:   Where should this test be performed    Answer:   [facility])    Order Specific Question:   Does the patient weigh less than or greater than 250 lbs?    Answer:   Patient weighs less than 250 lbs    Order Specific Question:   Perflutren DEFINITY (image enhancing agent) should be administered unless hypersensitivity or allergy exist    Answer:   Administer Perflutren    Order Specific Question:   Reason for exam-Echo    Answer:   Other-Full Diagnosis List    Order Specific Question:   Full ICD-10/Reason for Exam    Answer:   CAD (coronary artery disease) [829562]    As part of medical decision making labs from May 2024 independently reviewed, EKG, prior echo and stress test results were reviewed during today's encounter  Final Medication List:    Meds ordered this encounter  Medications   DISCONTD: eplerenone (INSPRA) 25 MG tablet    Sig: Take 1 tablet (25 mg total) by mouth daily.    Dispense:  90 tablet    Refill:  0   eplerenone (INSPRA) 25 MG tablet    Sig: Take 1 tablet (25 mg total) by mouth daily.    Dispense:  90 tablet    Refill:  0    Medications Discontinued During This Encounter  Medication Reason   MAGNESIUM OXIDE PO Patient Preference   potassium chloride SA (KLOR-CON M) 20 MEQ tablet    eplerenone (INSPRA) 25 MG tablet      Current Outpatient Medications:    amLODipine (NORVASC) 10 MG  tablet, Take 1 tablet (10 mg total) by mouth daily., Disp: 90 tablet, Rfl: 2   aspirin 81 MG EC tablet, Take 1 tablet by mouth daily., Disp: , Rfl:    atorvastatin (LIPITOR) 40 MG tablet, Take 1 tablet (40 mg total) by mouth daily., Disp: 90 tablet, Rfl: 2   Cholecalciferol (VITAMIN D3) 25 MCG (1000 UT) CAPS, Take 1,000 Units by mouth daily., Disp: , Rfl:    clopidogrel (PLAVIX) 75 MG tablet, TAKE ONE TABLET BY MOUTH DAILY, Disp: 90 tablet, Rfl: 3   hydrALAZINE (APRESOLINE) 25 MG tablet, TAKE ONE TABLET BY MOUTH TWICE DAILY, Disp: 60 tablet, Rfl: 2   loratadine (CLARITIN) 10 MG tablet, Take 10 mg by mouth daily., Disp: , Rfl:    metFORMIN (GLUCOPHAGE) 500 MG tablet, Take 1 tablet (500 mg total) by mouth daily with breakfast., Disp: 90 tablet, Rfl: 2   metoprolol succinate (TOPROL-XL) 25 MG 24 hr tablet, TAKE ONE TABLET BY MOUTH EVERY DAY **hold if blood pressure is less THAN 100 mmhg OR  pulse is less THAN 60 beats PER minute**, Disp: 90 tablet, Rfl: 1   MITIGARE 0.6 MG CAPS, Take 1 capsule by mouth 3 (three) times daily as needed., Disp: 30 capsule, Rfl: 2   Multiple Vitamin (ONE-DAILY MULTI VITAMINS PO), Take by mouth., Disp: , Rfl:    sildenafil (REVATIO) 20 MG tablet, TAKE ONE TABLET BY MOUTH EVERY DAY AS NEEDED, Disp: 10 tablet, Rfl: 1   valsartan (DIOVAN) 160 MG tablet, TAKE ONE TABLET BY MOUTH TWICE DAILY BEFORE MEALS breakfast AND dinner, Disp: 90 tablet, Rfl: 2   eplerenone (INSPRA) 25 MG tablet, Take 1 tablet (25 mg total) by mouth daily., Disp: 90 tablet, Rfl: 0   nitroGLYCERIN (NITROSTAT) 0.4 MG SL tablet, Place 1 tablet (0.4 mg total) under the tongue every 5 (five) minutes as needed for chest pain. If you require more than two tablets five minutes apart go to the nearest ER via EMS., Disp: 30 tablet, Rfl: 0  Consent:   N/A  Disposition:   6 months sooner if needed-CAD/risk factors Patient may be asked to follow-up sooner based on the results of the above-mentioned testing.  His  questions and concerns were addressed to his satisfaction. He voices understanding of the recommendations provided during this encounter.    Signed, Tessa Lerner, DO, [facility]  [facility]  [address] [place], [place] [address] 03/02/2023 5:15 PM

## 2023-03-15 DIAGNOSIS — I10 Essential (primary) hypertension: Secondary | ICD-10-CM | POA: Diagnosis not present

## 2023-03-16 LAB — BASIC METABOLIC PANEL
BUN/Creatinine Ratio: 15 (ref 10–24)
BUN: 21 mg/dL (ref 8–27)
CO2: 19 mmol/L — ABNORMAL LOW (ref 20–29)
Calcium: 9.2 mg/dL (ref 8.6–10.2)
Chloride: 101 mmol/L (ref 96–106)
Creatinine, Ser: 1.42 mg/dL — ABNORMAL HIGH (ref 0.76–1.27)
Glucose: 72 mg/dL (ref 70–99)
Potassium: 4.6 mmol/L (ref 3.5–5.2)
Sodium: 140 mmol/L (ref 134–144)
eGFR: 54 mL/min/{1.73_m2} — ABNORMAL LOW (ref >59)

## 2023-05-04 DIAGNOSIS — H2513 Age-related nuclear cataract, bilateral: Secondary | ICD-10-CM | POA: Diagnosis not present

## 2023-05-04 DIAGNOSIS — H11042 Peripheral pterygium, stationary, left eye: Secondary | ICD-10-CM | POA: Diagnosis not present

## 2023-05-04 DIAGNOSIS — H401133 Primary open-angle glaucoma, bilateral, severe stage: Secondary | ICD-10-CM | POA: Diagnosis not present

## 2023-05-05 ENCOUNTER — Other Ambulatory Visit: Payer: Self-pay | Admitting: Internal Medicine

## 2023-05-05 DIAGNOSIS — I119 Hypertensive heart disease without heart failure: Secondary | ICD-10-CM

## 2023-05-12 ENCOUNTER — Other Ambulatory Visit: Payer: Self-pay | Admitting: Internal Medicine

## 2023-05-14 ENCOUNTER — Other Ambulatory Visit: Payer: Self-pay | Admitting: Internal Medicine

## 2023-06-02 DIAGNOSIS — H11042 Peripheral pterygium, stationary, left eye: Secondary | ICD-10-CM | POA: Diagnosis not present

## 2023-06-02 DIAGNOSIS — H401133 Primary open-angle glaucoma, bilateral, severe stage: Secondary | ICD-10-CM | POA: Diagnosis not present

## 2023-06-02 DIAGNOSIS — H2513 Age-related nuclear cataract, bilateral: Secondary | ICD-10-CM | POA: Diagnosis not present

## 2023-06-05 ENCOUNTER — Other Ambulatory Visit: Payer: Self-pay | Admitting: Cardiology

## 2023-06-05 DIAGNOSIS — I10 Essential (primary) hypertension: Secondary | ICD-10-CM

## 2023-06-17 ENCOUNTER — Other Ambulatory Visit: Payer: Self-pay | Admitting: Internal Medicine

## 2023-06-17 DIAGNOSIS — I119 Hypertensive heart disease without heart failure: Secondary | ICD-10-CM

## 2023-06-21 ENCOUNTER — Other Ambulatory Visit: Payer: Self-pay | Admitting: Internal Medicine

## 2023-06-21 DIAGNOSIS — I119 Hypertensive heart disease without heart failure: Secondary | ICD-10-CM

## 2023-06-21 DIAGNOSIS — I2510 Atherosclerotic heart disease of native coronary artery without angina pectoris: Secondary | ICD-10-CM

## 2023-08-05 ENCOUNTER — Encounter (HOSPITAL_COMMUNITY): Payer: Self-pay | Admitting: Cardiology

## 2023-08-26 ENCOUNTER — Other Ambulatory Visit: Payer: Self-pay | Admitting: Internal Medicine

## 2023-08-26 DIAGNOSIS — I119 Hypertensive heart disease without heart failure: Secondary | ICD-10-CM

## 2023-09-16 ENCOUNTER — Other Ambulatory Visit: Payer: Self-pay | Admitting: Cardiology

## 2023-09-23 ENCOUNTER — Ambulatory Visit: Payer: Self-pay | Admitting: Internal Medicine

## 2023-09-23 ENCOUNTER — Encounter: Payer: Self-pay | Admitting: Internal Medicine

## 2023-09-23 ENCOUNTER — Ambulatory Visit: Payer: Medicare Other | Admitting: Internal Medicine

## 2023-09-23 VITALS — BP 130/64 | HR 62 | Temp 99.4°F | Ht 63.0 in | Wt 193.0 lb

## 2023-09-23 DIAGNOSIS — I2510 Atherosclerotic heart disease of native coronary artery without angina pectoris: Secondary | ICD-10-CM | POA: Diagnosis not present

## 2023-09-23 DIAGNOSIS — E1169 Type 2 diabetes mellitus with other specified complication: Secondary | ICD-10-CM

## 2023-09-23 DIAGNOSIS — E66811 Obesity, class 1: Secondary | ICD-10-CM | POA: Diagnosis not present

## 2023-09-23 DIAGNOSIS — I119 Hypertensive heart disease without heart failure: Secondary | ICD-10-CM

## 2023-09-23 DIAGNOSIS — E6609 Other obesity due to excess calories: Secondary | ICD-10-CM

## 2023-09-23 DIAGNOSIS — Z6834 Body mass index (BMI) 34.0-34.9, adult: Secondary | ICD-10-CM

## 2023-09-23 LAB — POCT URINALYSIS DIPSTICK
Bilirubin, UA: NEGATIVE
Blood, UA: NEGATIVE
Glucose, UA: NEGATIVE
Ketones, UA: NEGATIVE
Leukocytes, UA: NEGATIVE
Nitrite, UA: NEGATIVE
Protein, UA: NEGATIVE
Spec Grav, UA: 1.015 — AB
Urobilinogen, UA: 0.2 U/dL
pH, UA: 8

## 2023-09-23 MED ORDER — HYDRALAZINE HCL 25 MG PO TABS
25.0000 mg | ORAL_TABLET | Freq: Two times a day (BID) | ORAL | 2 refills | Status: AC
Start: 2023-09-23 — End: ?

## 2023-09-23 MED ORDER — VALSARTAN 160 MG PO TABS: 160.0000 mg | ORAL_TABLET | Freq: Two times a day (BID) | ORAL | 1 refills | Status: DC

## 2023-09-23 MED ORDER — ATORVASTATIN CALCIUM 40 MG PO TABS: 40.0000 mg | ORAL_TABLET | Freq: Every day | ORAL | 2 refills | Status: DC

## 2023-09-23 MED ORDER — METOPROLOL SUCCINATE ER 25 MG PO TB24
ORAL_TABLET | ORAL | 1 refills | Status: AC
Start: 2023-09-23 — End: ?

## 2023-09-23 NOTE — Progress Notes (Signed)
 I,Jameka J Llittleton, CMA,acting as a Neurosurgeon for Smiley Dung, MD.,have documented all relevant documentation on the behalf of Smiley Dung, MD,as directed by  Smiley Dung, MD while in the presence of Smiley Dung, MD.  Subjective:  Patient ID: Thomas Rowland , male    DOB: 1955/06/18 , 68 y.o.   MRN: [id number]  Chief Complaint  Patient presents with   Hypertension   Diabetes    Patient presents today for a bp/dm check. Patient doesn't have any specific questions or concerns. Patient denies headaches, chest pain & sob.     HPI Discussed the use of AI scribe software for clinical note transcription with the patient, who gave verbal consent to proceed.  History of Present Illness Thomas Rowland is a 68 year old male with hypertension and diabetes who presents for a diabetes check.  He has not had an appointment since November or December of last year and is here for his first appointment of the year to check on his diabetes and manage his medications. He sometimes does not check his blood sugars and is not regularly exercising, although he does some physical activity occasionally. He confirms drinking plenty of water.  He is currently taking valsartan  160 mg twice a day, metoprolol  25 mg daily, hydralazine  twice a day, amlodipine  10 mg, atorvastatin  40 mg, and metformin  daily. He mentions that he was told to stop potassium by his heart doctor, Dr. Albert Huff. There seems to be an issue with prescription refills not being updated at [facility].  He has not seen his heart doctor this year, with the last visit being in November or December of the previous year.   Hypertension This is a chronic problem. The current episode started more than 1 year ago. The problem has been gradually worsening since onset. The problem is uncontrolled. Pertinent negatives include no anxiety, blurred vision, chest pain, headaches or palpitations. There are no associated agents to hypertension.  Risk factors for coronary artery disease include obesity and diabetes mellitus. Past treatments include diuretics. The current treatment provides no improvement. There are no compliance problems.  There is no history of angina.  Diabetes He presents for his follow-up diabetic visit. He has type 2 diabetes mellitus. There are no hypoglycemic associated symptoms. Pertinent negatives for hypoglycemia include no dizziness or headaches. Pertinent negatives for diabetes include no blurred vision, no chest pain, no fatigue, no polydipsia, no polyphagia and no polyuria. Symptoms are stable.     Past Medical History:  Diagnosis Date   Coronary artery disease    Diabetes mellitus without complication (HCC)    History of COVID-19    Hyperlipidemia    Hypertension    Nonspecific abnormal electrocardiogram (ECG) (EKG) 06/26/2013     Family History  Family history unknown: Yes     Current Outpatient Medications:    amLODipine  (NORVASC ) 10 MG tablet, Take 1 tablet (10 mg total) by mouth daily., Disp: 90 tablet, Rfl: 2   aspirin 81 MG EC tablet, Take 1 tablet by mouth daily., Disp: , Rfl:    clopidogrel  (PLAVIX ) 75 MG tablet, TAKE ONE TABLET BY MOUTH DAILY, Disp: 90 tablet, Rfl: 1   eplerenone  (INSPRA ) 25 MG tablet, Take 1 tablet (25 mg total) by mouth daily., Disp: 90 tablet, Rfl: 3   metFORMIN  (GLUCOPHAGE ) 500 MG tablet, TAKE ONE TABLET BY MOUTH EVERY DAY WITH breakfast, Disp: 90 tablet, Rfl: 2   nitroGLYCERIN  (NITROSTAT ) 0.4 MG SL tablet, Place 1 tablet (0.4 mg  total) under the tongue every 5 (five) minutes as needed for chest pain. If you require more than two tablets five minutes apart go to the nearest ER via EMS., Disp: 30 tablet, Rfl: 0   atorvastatin  (LIPITOR) 40 MG tablet, Take 1 tablet (40 mg total) by mouth daily., Disp: 90 tablet, Rfl: 2   Cholecalciferol (VITAMIN D3) 25 MCG (1000 UT) CAPS, Take 1,000 Units by mouth daily., Disp: , Rfl:    hydrALAZINE  (APRESOLINE ) 25 MG tablet, Take 1 tablet  (25 mg total) by mouth 2 (two) times daily., Disp: 180 tablet, Rfl: 2   loratadine (CLARITIN) 10 MG tablet, Take 10 mg by mouth daily., Disp: , Rfl:    metoprolol  succinate (TOPROL -XL) 25 MG 24 hr tablet, TAKE ONE TABLET BY MOUTH EVERY DAY ** Hold if FOR BLOOD PRESSURE is less THAN OR pulse less THAN 60 br=eats PER MINUTES, Disp: 90 tablet, Rfl: 1   MITIGARE  0.6 MG CAPS, Take 1 capsule by mouth 3 (three) times daily as needed. (Patient not taking: Reported on 09/23/2023), Disp: 30 capsule, Rfl: 2   Multiple Vitamin (ONE-DAILY MULTI VITAMINS PO), Take by mouth. (Patient not taking: Reported on 09/23/2023), Disp: , Rfl:    sildenafil (REVATIO) 20 MG tablet, TAKE ONE TABLET BY MOUTH EVERY DAY AS NEEDED (Patient not taking: Reported on 09/23/2023), Disp: 10 tablet, Rfl: 1   valsartan  (DIOVAN ) 160 MG tablet, Take 1 tablet (160 mg total) by mouth 2 (two) times daily., Disp: 180 tablet, Rfl: 1   Allergies  Allergen Reactions   Lisinopril Cough     Review of Systems  Constitutional: Negative.  Negative for fatigue.  Eyes: Negative.  Negative for blurred vision.  Respiratory: Negative.    Cardiovascular: Negative.  Negative for chest pain and palpitations.  Gastrointestinal: Negative.   Endocrine: Negative for polydipsia, polyphagia and polyuria.  Neurological:  Negative for dizziness and headaches.     Today's Vitals   09/23/23 1520  BP: 130/64  Pulse: 62  Temp: 99.4 F (37.4 C)  SpO2: 98%  Weight: 193 lb (87.5 kg)  Height: 5\' 3"  (1.6 m)   Body mass index is 34.19 kg/m.  Wt Readings from Last 3 Encounters:  09/23/23 193 lb (87.5 kg)  03/02/23 187 lb 3.2 oz (84.9 kg)  09/23/22 181 lb (82.1 kg)    The ASCVD Risk score (Arnett DK, et al., 2019) failed to calculate for the following reasons:   The valid total cholesterol range is 130 to 320 mg/dL  Objective:  Physical Exam Vitals and nursing note reviewed.  Constitutional:      Appearance: Normal appearance. He is obese.   HENT:     Head: Normocephalic and atraumatic.  Eyes:     Extraocular Movements: Extraocular movements intact.  Cardiovascular:     Rate and Rhythm: Normal rate and regular rhythm.     Heart sounds: Normal heart sounds.  Pulmonary:     Effort: Pulmonary effort is normal.     Breath sounds: Normal breath sounds.  Musculoskeletal:     Cervical back: Normal range of motion.     Right lower leg: No edema.     Left lower leg: No edema.  Skin:    General: Skin is warm.  Neurological:     General: No focal deficit present.     Mental Status: He is alert.  Psychiatric:        Mood and Affect: Mood normal.       Assessment And Plan:  Hypertensive  heart disease without heart failure Assessment & Plan: Chronic hypertension managed with valsartan , metoprolol , hydralazine , and amlodipine . No cardiology follow-up this year. - Refill valsartan , metoprolol , hydralazine , and amlodipine  prescriptions. - Order blood tests and urine sample.  Orders: -     CBC -     CMP14+EGFR -     Valsartan ; Take 1 tablet (160 mg total) by mouth 2 (two) times daily.  Dispense: 180 tablet; Refill: 1 -     hydrALAZINE  HCl; Take 1 tablet (25 mg total) by mouth 2 (two) times daily.  Dispense: 180 tablet; Refill: 2 -     Metoprolol  Succinate ER; TAKE ONE TABLET BY MOUTH EVERY DAY ** Hold if FOR BLOOD PRESSURE is less THAN OR pulse less THAN 60 br=eats PER MINUTES  Dispense: 90 tablet; Refill: 1 -     TSH  Atherosclerosis of native coronary artery of native heart without angina pectoris Assessment & Plan: Chronic, LDL goal is less than 70 -- ideally less than 55.   - Continue with atorvastatin  - Avoid fried foods - Follow heart healthy lifestyle  Orders: -     Lipid panel -     Metoprolol  Succinate ER; TAKE ONE TABLET BY MOUTH EVERY DAY ** Hold if FOR BLOOD PRESSURE is less THAN OR pulse less THAN 60 br=eats PER MINUTES  Dispense: 90 tablet; Refill: 1 -     Lipoprotein A (LPA)  Type 2  diabetes mellitus with other specified complication, without long-term current use of insulin (HCC) Assessment & Plan: Type 2 diabetes mellitus managed with metformin . Inconsistent blood glucose monitoring reported. - Refill metformin  prescription. - Order blood tests and urine microalbumin - Consider adding SGLT2 inhibitor - F/u in 3-4 months  Orders: -     POCT urinalysis dipstick -     Microalbumin / creatinine urine ratio -     Hemoglobin A1c  Class 1 obesity due to excess calories with serious comorbidity and body mass index (BMI) of 34.0 to 34.9 in adult Assessment & Plan: He is encouraged to initially strive for BMI less than 30 to decrease cardiac risk. He is advised to exercise no less than 150 minutes per week.     Other orders -     Atorvastatin  Calcium ; Take 1 tablet (40 mg total) by mouth daily.  Dispense: 90 tablet; Refill: 2   Return if symptoms worsen or fail to improve.  Patient was given opportunity to ask questions. Patient verbalized understanding of the plan and was able to repeat key elements of the plan. All questions were answered to their satisfaction.    I, Smiley Dung, MD, have reviewed all documentation for this visit. The documentation on 09/23/23 for the exam, diagnosis, procedures, and orders are all accurate and complete.   IF YOU HAVE BEEN REFERRED TO A SPECIALIST, IT MAY TAKE 1-2 WEEKS TO SCHEDULE/PROCESS THE REFERRAL. IF YOU HAVE NOT HEARD FROM US /SPECIALIST IN TWO WEEKS, PLEASE GIVE US  A CALL AT [phone].

## 2023-09-25 LAB — CMP14+EGFR
ALT: 20 IU/L (ref 0–44)
AST: 22 IU/L (ref 0–40)
Albumin: 3.9 g/dL (ref 3.9–4.9)
Alkaline Phosphatase: 67 IU/L (ref 44–121)
BUN/Creatinine Ratio: 17 (ref 10–24)
BUN: 21 mg/dL (ref 8–27)
Bilirubin Total: 0.2 mg/dL (ref 0.0–1.2)
CO2: 21 mmol/L (ref 20–29)
Calcium: 8.9 mg/dL (ref 8.6–10.2)
Chloride: 100 mmol/L (ref 96–106)
Creatinine, Ser: 1.25 mg/dL (ref 0.76–1.27)
Globulin, Total: 2.8 g/dL (ref 1.5–4.5)
Glucose: 124 mg/dL — ABNORMAL HIGH (ref 70–99)
Potassium: 4.4 mmol/L (ref 3.5–5.2)
Sodium: 136 mmol/L (ref 134–144)
Total Protein: 6.7 g/dL (ref 6.0–8.5)
eGFR: 63 mL/min/{1.73_m2} (ref >59)

## 2023-09-25 LAB — CBC
Hematocrit: 39.4 % (ref 37.5–51.0)
Hemoglobin: 12.5 g/dL — ABNORMAL LOW (ref 13.0–17.7)
MCH: 27.5 pg (ref 26.6–33.0)
MCHC: 31.7 g/dL (ref 31.5–35.7)
MCV: 87 fL (ref 79–97)
Platelets: 162 10*3/uL (ref 150–450)
RBC: 4.54 x10E6/uL (ref 4.14–5.80)
RDW: 13.1 % (ref 11.6–15.4)
WBC: 9.3 10*3/uL (ref 3.4–10.8)

## 2023-09-25 LAB — HEMOGLOBIN A1C
Est. average glucose Bld gHb Est-mCnc: 117 mg/dL
Hgb A1c MFr Bld: 5.7 % — ABNORMAL HIGH (ref 4.8–5.6)

## 2023-09-25 LAB — MICROALBUMIN / CREATININE URINE RATIO
Creatinine, Urine: 83.2 mg/dL
Microalb/Creat Ratio: 62 mg/g{creat} — ABNORMAL HIGH (ref 0–29)
Microalbumin, Urine: 51.6 ug/mL

## 2023-09-25 LAB — LIPOPROTEIN A (LPA): Lipoprotein (a): 169.6 nmol/L — ABNORMAL HIGH (ref <75.0)

## 2023-09-25 LAB — LIPID PANEL
Chol/HDL Ratio: 2.6 ratio (ref 0.0–5.0)
Cholesterol, Total: 124 mg/dL (ref 100–199)
HDL: 48 mg/dL (ref >39)
LDL Chol Calc (NIH): 61 mg/dL (ref 0–99)
Triglycerides: 74 mg/dL (ref 0–149)
VLDL Cholesterol Cal: 15 mg/dL (ref 5–40)

## 2023-09-25 LAB — TSH: TSH: 1.5 u[IU]/mL (ref 0.450–4.500)

## 2023-09-26 DIAGNOSIS — I2510 Atherosclerotic heart disease of native coronary artery without angina pectoris: Secondary | ICD-10-CM | POA: Insufficient documentation

## 2023-09-26 DIAGNOSIS — E6609 Other obesity due to excess calories: Secondary | ICD-10-CM | POA: Insufficient documentation

## 2023-09-26 NOTE — Assessment & Plan Note (Signed)
 Chronic, LDL goal is less than 70 -- ideally less than 55.   - Continue with atorvastatin  - Avoid fried foods - Follow heart healthy lifestyle

## 2023-09-26 NOTE — Assessment & Plan Note (Signed)
 He is encouraged to initially strive for BMI less than 30 to decrease cardiac risk. He is advised to exercise no less than 150 minutes per week.

## 2023-09-26 NOTE — Assessment & Plan Note (Signed)
 Chronic hypertension managed with valsartan , metoprolol , hydralazine , and amlodipine . No cardiology follow-up this year. - Refill valsartan , metoprolol , hydralazine , and amlodipine  prescriptions. - Order blood tests and urine sample.

## 2023-09-26 NOTE — Assessment & Plan Note (Addendum)
 Type 2 diabetes mellitus managed with metformin . Inconsistent blood glucose monitoring reported. - Refill metformin  prescription. - Order blood tests and urine microalbumin - Consider adding SGLT2 inhibitor - F/u in 3-4 months

## 2023-09-29 ENCOUNTER — Ambulatory Visit: Payer: Medicare Other

## 2023-09-29 VITALS — BP 124/64 | HR 73 | Temp 98.9°F | Ht 63.0 in | Wt 189.2 lb

## 2023-09-29 DIAGNOSIS — Z0000 Encounter for general adult medical examination without abnormal findings: Secondary | ICD-10-CM | POA: Diagnosis not present

## 2023-09-29 DIAGNOSIS — Z1211 Encounter for screening for malignant neoplasm of colon: Secondary | ICD-10-CM

## 2023-09-29 NOTE — Patient Instructions (Signed)
 [name] , Thank you for taking time out of your busy schedule to complete your Annual Wellness Visit with me. I enjoyed our conversation and look forward to speaking with you again next year. I, as well as your care team,  appreciate your ongoing commitment to your health goals. Please review the following plan we discussed and let me know if I can assist you in the future. Your Game plan/ To Do List    Referrals: If you haven't heard from the office you've been referred to, please reach out to them at the phone provided.  N/a Follow up Visits: Next Medicare AWV with our clinical staff: 11/15/2024 at 3:20   Have you seen your provider in the last 6 months (3 months if uncontrolled diabetes)? Yes Next Office Visit with your provider: 12/30/2023 at 3:00  Clinician Recommendations:  Aim for 30 minutes of exercise or brisk walking, 6-8 glasses of water, and 5 servings of fruits and vegetables each day.       This is a list of the screening recommended for you and due dates:  Health Maintenance  Topic Date Due   Colon Cancer Screening  02/04/2023   COVID-19 Vaccine (6 - Pfizer risk 2024-25 season) 09/13/2023   Complete foot exam   09/16/2023   Eye exam for diabetics  10/23/2023   Flu Shot  11/26/2023   Hemoglobin A1C  03/25/2024   Yearly kidney function blood test for diabetes  09/22/2024   Yearly kidney health urinalysis for diabetes  09/22/2024   Medicare Annual Wellness Visit  09/28/2024   DTaP/Tdap/Td vaccine (2 - Td or Tdap) 08/14/2030   Pneumonia Vaccine  Completed   Hepatitis C Screening  Completed   Zoster (Shingles) Vaccine  Completed   HPV Vaccine  Aged Out   Meningitis B Vaccine  Aged Out    Advanced directives: (Declined) Advance directive discussed with you today. Even though you declined this today, please call our office should you change your mind, and we can give you the proper paperwork for you to fill out. Advance Care Planning is important because it:  [x]  Makes  sure you receive the medical care that is consistent with your values, goals, and preferences  [x]  It provides guidance to your family and loved ones and reduces their decisional burden about whether or not they are making the right decisions based on your wishes.  Follow the link provided in your after visit summary or read over the paperwork we have mailed to you to help you started getting your Advance Directives in place. If you need assistance in completing these, please reach out to us  so that we can help you!  See attachments for Preventive Care and Fall Prevention Tips.

## 2023-09-29 NOTE — Progress Notes (Signed)
 Subjective:   Thomas Rowland is a 68 y.o. who presents for a Medicare Wellness preventive visit.  As a reminder, Annual Wellness Visits don't include a physical exam, and some assessments may be limited, especially if this visit is performed virtually. We may recommend an in-person follow-up visit with your provider if needed.  Visit Complete: In person    Persons Participating in Visit: Patient.  AWV Questionnaire: No: Patient Medicare AWV questionnaire was not completed prior to this visit.  Cardiac Risk Factors include: advanced age (>31men, >56 women);diabetes mellitus;hypertension;male gender     Objective:     Today's Vitals   09/29/23 1553  BP: 124/64  Pulse: 73  Temp: 98.9 F (37.2 C)  TempSrc: Oral  SpO2: 95%  Weight: 189 lb 3.2 oz (85.8 kg)  Height: 5\' 3"  (1.6 m)   Body mass index is 33.52 kg/m.     09/29/2023    4:06 PM 09/23/2022    4:27 PM 10/02/2021    4:15 PM 10/23/2015   11:15 PM  Advanced Directives  Does Patient Have a Medical Advance Directive? No No No No  Would patient like information on creating a medical advance directive? No - Patient declined No - Patient declined  No - patient declined information    Current Medications (verified) Outpatient Encounter Medications as of 09/29/2023  Medication Sig   amLODipine  (NORVASC ) 10 MG tablet Take 1 tablet (10 mg total) by mouth daily.   aspirin 81 MG EC tablet Take 1 tablet by mouth daily.   atorvastatin  (LIPITOR) 40 MG tablet Take 1 tablet (40 mg total) by mouth daily.   Cholecalciferol (VITAMIN D3) 25 MCG (1000 UT) CAPS Take 1,000 Units by mouth daily.   clopidogrel  (PLAVIX ) 75 MG tablet TAKE ONE TABLET BY MOUTH DAILY   eplerenone  (INSPRA ) 25 MG tablet Take 1 tablet (25 mg total) by mouth daily.   hydrALAZINE  (APRESOLINE ) 25 MG tablet Take 1 tablet (25 mg total) by mouth 2 (two) times daily.   loratadine (CLARITIN) 10 MG tablet Take 10 mg by mouth daily.   metFORMIN  (GLUCOPHAGE ) 500 MG tablet  TAKE ONE TABLET BY MOUTH EVERY DAY WITH breakfast   metoprolol  succinate (TOPROL -XL) 25 MG 24 hr tablet TAKE ONE TABLET BY MOUTH EVERY DAY ** Hold if FOR BLOOD PRESSURE is less THAN OR pulse less THAN 60 br=eats PER MINUTES   valsartan  (DIOVAN ) 160 MG tablet Take 1 tablet (160 mg total) by mouth 2 (two) times daily.   MITIGARE  0.6 MG CAPS Take 1 capsule by mouth 3 (three) times daily as needed. (Patient not taking: Reported on 09/29/2023)   Multiple Vitamin (ONE-DAILY MULTI VITAMINS PO) Take by mouth. (Patient not taking: Reported on 09/29/2023)   nitroGLYCERIN  (NITROSTAT ) 0.4 MG SL tablet Place 1 tablet (0.4 mg total) under the tongue every 5 (five) minutes as needed for chest pain. If you require more than two tablets five minutes apart go to the nearest ER via EMS.   sildenafil (REVATIO) 20 MG tablet TAKE ONE TABLET BY MOUTH EVERY DAY AS NEEDED (Patient not taking: Reported on 09/29/2023)   No facility-administered encounter medications on file as of 09/29/2023.    Allergies (verified) Lisinopril   History: Past Medical History:  Diagnosis Date   Coronary artery disease    Diabetes mellitus without complication (HCC)    History of COVID-19    Hyperlipidemia    Hypertension    Nonspecific abnormal electrocardiogram (ECG) (EKG) 06/26/2013   Past Surgical History:  Procedure  Laterality Date   COLONOSCOPY  2014   Normal   CORONARY ANGIOPLASTY WITH STENT PLACEMENT     LEG SURGERY Left 1997   [place]   Family History  Family history unknown: Yes   Social History   Socioeconomic History   Marital status: Married    Spouse name: Not on file   Number of children: 3   Years of education: Not on file   Highest education level: Not on file  Occupational History    Employer: [employer]  Tobacco Use   Smoking status: Former    Current packs/day: 0.00    Average packs/day: 0.3 packs/day for 33.2 years (8.3 ttl pk-yrs)    Types: Cigarettes, E-cigarettes    Start date: 58    Quit date:  06/26/2001    Years since quitting: 22.2   Smokeless tobacco: Never  Vaping Use   Vaping status: Never Used  Substance and Sexual Activity   Alcohol use: Yes    Comment: occ   Drug use: No   Sexual activity: Not on file  Other Topics Concern   Not on file  Social History Narrative   Not on file   Social Drivers of Health   Financial Resource Strain: Low Risk  (09/29/2023)   Overall Financial Resource Strain (CARDIA)    Difficulty of Paying Living Expenses: Not hard at all  Food Insecurity: No Food Insecurity (09/29/2023)   Hunger Vital Sign    Worried About Running Out of Food in the Last Year: Never true    Ran Out of Food in the Last Year: Never true  Transportation Needs: No Transportation Needs (09/29/2023)   PRAPARE - Administrator, Civil Service (Medical): No    Lack of Transportation (Non-Medical): No  Physical Activity: Inactive (09/29/2023)   Exercise Vital Sign    Days of Exercise per Week: 0 days    Minutes of Exercise per Session: 0 min  Stress: No Stress Concern Present (09/29/2023)   [employer] of Occupational Health - Occupational Stress Questionnaire    Feeling of Stress : Not at all  Social Connections: Socially Integrated (09/29/2023)   Social Connection and Isolation Panel [NHANES]    Frequency of Communication with Friends and Family: More than three times a week    Frequency of Social Gatherings with Friends and Family: More than three times a week    Attends Religious Services: More than 4 times per year    Active Member of [employer] or Organizations: Yes    Attends Engineer, structural: More than 4 times per year    Marital Status: Married    Tobacco Counseling Counseling given: Not Answered    Clinical Intake:  Pre-visit preparation completed: Yes  Pain : No/denies pain     Nutritional Status: BMI > 30  Obese Nutritional Risks: None Diabetes: Yes CBG done?: No Did pt. bring in CBG monitor from home?: No  Lab Results   Component Value Date   HGBA1C 5.7 (H) 09/23/2023   HGBA1C 5.6 09/16/2022   HGBA1C 5.7 (H) 05/18/2022     How often do you need to have someone help you when you read instructions, pamphlets, or other written materials from your doctor or pharmacy?: 1 - Never  Interpreter Needed?: No  Information entered by :: NAllen LPN   Activities of Daily Living     09/29/2023    3:58 PM  In your present state of health, do you have any difficulty performing the following activities:  Hearing? 0  Vision? 0  Difficulty concentrating or making decisions? 0  Walking or climbing stairs? 0  Dressing or bathing? 0  Doing errands, shopping? 0  Preparing Food and eating ? N  Using the Toilet? N  In the past six months, have you accidently leaked urine? N  Do you have problems with loss of bowel control? N  Managing your Medications? N  Managing your Finances? N  Housekeeping or managing your Housekeeping? N    Patient Care Team: Cleave Curling, MD as PCP - General (Internal Medicine) Olinda Bertrand, DO as PCP - Cardiology (Cardiology)  I have updated your Care Teams any recent Medical Services you may have received from other providers in the past year.     Assessment:    This is a routine wellness examination for Anderson.  Hearing/Vision screen Hearing Screening - Comments:: Denies hearing issues Vision Screening - Comments:: Regular eye exams,    Goals Addressed             This Visit's Progress    Patient Stated       09/29/2023, denies goals       Depression Screen     09/29/2023    4:08 PM 09/23/2023    3:19 PM 09/23/2022    4:28 PM 09/16/2022    3:17 PM 05/18/2022    4:11 PM 10/02/2021    4:20 PM 12/18/2020    4:17 PM  PHQ 2/9 Scores  PHQ - 2 Score 0 0 0 0 0 0 0  PHQ- 9 Score 0 0     0    Fall Risk     09/29/2023    4:07 PM 09/23/2023    3:19 PM 09/23/2022    4:28 PM 09/16/2022    3:17 PM 05/18/2022    4:11 PM  Fall Risk   Falls in the past year? 0 0 0 0 0  Number  falls in past yr: 0 0 0 0 0  Injury with Fall? 0 0 0 0 0  Risk for fall due to : Medication side effect No Fall Risks Medication side effect No Fall Risks No Fall Risks  Follow up Falls evaluation completed;Falls prevention discussed Falls evaluation completed Falls prevention discussed;Education provided;Falls evaluation completed Falls evaluation completed Falls evaluation completed    MEDICARE RISK AT HOME:  Medicare Risk at Home Any stairs in or around the home?: No If so, are there any without handrails?: No Home free of loose throw rugs in walkways, pet beds, electrical cords, etc?: Yes Adequate lighting in your home to reduce risk of falls?: Yes Life alert?: No Use of a cane, walker or w/c?: No Grab bars in the bathroom?: No Shower chair or bench in shower?: No Elevated toilet seat or a handicapped toilet?: No  TIMED UP AND GO:  Was the test performed?  Yes  Length of time to ambulate 10 feet: 5 sec Gait steady and fast without use of assistive device  Cognitive Function: Declined/Normal: No cognitive concerns noted by patient or family. Patient alert, oriented, able to answer questions appropriately and recall recent events. No signs of memory loss or confusion.        09/23/2022    4:29 PM  6CIT Screen  What Year? 0 points  What month? 0 points  What time? 0 points  Count back from 20 0 points  Months in reverse 0 points  Repeat phrase 10 points  Total Score 10 points    Immunizations Immunization History  Administered Date(s) Administered   Fluad Quad(high Dose 65+) 04/02/2021, 01/13/2022   Influenza, High Dose Seasonal PF 05/21/2020   Influenza,inj,Quad PF,6+ Mos 01/12/2019   PFIZER(Purple Top)SARS-COV-2 Vaccination 07/12/2019, 08/02/2019, 03/11/2020   PNEUMOCOCCAL CONJUGATE-20 09/03/2021   Pfizer Covid-19 Vaccine Bivalent Booster 11yrs & up 01/22/2021   Tdap 08/13/2020   Unspecified SARS-COV-2 Vaccination 03/16/2023   Zoster Recombinant(Shingrix )  09/30/2021, 06/25/2022, 08/07/2022    Screening Tests Health Maintenance  Topic Date Due   Colonoscopy  02/04/2023   COVID-19 Vaccine (6 - Pfizer risk 2024-25 season) 09/13/2023   FOOT EXAM  09/16/2023   OPHTHALMOLOGY EXAM  10/23/2023   INFLUENZA VACCINE  11/26/2023   HEMOGLOBIN A1C  03/25/2024   Diabetic kidney evaluation - eGFR measurement  09/22/2024   Diabetic kidney evaluation - Urine ACR  09/22/2024   Medicare Annual Wellness (AWV)  09/28/2024   DTaP/Tdap/Td (2 - Td or Tdap) 08/14/2030   Pneumonia Vaccine 31+ Years old  Completed   Hepatitis C Screening  Completed   Zoster Vaccines- Shingrix   Completed   HPV VACCINES  Aged Out   Meningococcal B Vaccine  Aged Out    Health Maintenance  Health Maintenance Due  Topic Date Due   Colonoscopy  02/04/2023   COVID-19 Vaccine (6 - Pfizer risk 2024-25 season) 09/13/2023   FOOT EXAM  09/16/2023   Health Maintenance Items Addressed: Referral sent to GI for colonoscopy  Additional Screening:  Vision Screening: Recommended annual ophthalmology exams for early detection of glaucoma and other disorders of the eye. Would you like a referral to an eye doctor? No    Dental Screening: Recommended annual dental exams for proper oral hygiene  Community Resource Referral / Chronic Care Management: CRR required this visit?  No   CCM required this visit?  No   Plan:    I have personally reviewed and noted the following in the patient's chart:   Medical and social history Use of alcohol, tobacco or illicit drugs  Current medications and supplements including opioid prescriptions. Patient is not currently taking opioid prescriptions. Functional ability and status Nutritional status Physical activity Advanced directives List of other physicians Hospitalizations, surgeries, and ER visits in previous 12 months Vitals Screenings to include cognitive, depression, and falls Referrals and appointments  In addition, I have  reviewed and discussed with patient certain preventive protocols, quality metrics, and best practice recommendations. A written personalized care plan for preventive services as well as general preventive health recommendations were provided to patient.   Areatha Beecham, LPN   05/02/1094   After Visit Summary: (In Person-Printed) AVS printed and given to the patient  Notes: Nothing significant to report at this time.

## 2023-10-22 ENCOUNTER — Encounter: Payer: Self-pay | Admitting: Cardiology

## 2023-11-10 ENCOUNTER — Encounter: Payer: Self-pay | Admitting: Internal Medicine

## 2023-11-12 ENCOUNTER — Ambulatory Visit (HOSPITAL_COMMUNITY)
Admission: RE | Admit: 2023-11-12 | Discharge: 2023-11-12 | Disposition: A | Discharge status: Home / Self Care | Source: Ambulatory Visit | Attending: Cardiology | Admitting: Cardiology

## 2023-11-12 DIAGNOSIS — I2510 Atherosclerotic heart disease of native coronary artery without angina pectoris: Secondary | ICD-10-CM | POA: Diagnosis not present

## 2023-11-15 LAB — ECHOCARDIOGRAM COMPLETE
Area-P 1/2: 3.14 cm2
MV M vel: 5.38 m/s
MV Peak grad: 115.8 mmHg
Radius: 0.4 cm
S' Lateral: 2.9 cm

## 2023-11-21 ENCOUNTER — Ambulatory Visit: Payer: Self-pay | Admitting: Cardiology

## 2023-12-17 ENCOUNTER — Other Ambulatory Visit: Payer: Self-pay | Admitting: Internal Medicine

## 2023-12-30 ENCOUNTER — Ambulatory Visit (INDEPENDENT_AMBULATORY_CARE_PROVIDER_SITE_OTHER): Admitting: Family Medicine

## 2023-12-30 ENCOUNTER — Encounter: Payer: Self-pay | Admitting: Family Medicine

## 2023-12-30 ENCOUNTER — Encounter: Payer: Self-pay | Admitting: Internal Medicine

## 2023-12-30 VITALS — BP 130/70 | HR 69 | Temp 99.2°F | Ht 63.0 in | Wt 191.0 lb

## 2023-12-30 DIAGNOSIS — Z0000 Encounter for general adult medical examination without abnormal findings: Secondary | ICD-10-CM

## 2023-12-30 DIAGNOSIS — I2510 Atherosclerotic heart disease of native coronary artery without angina pectoris: Secondary | ICD-10-CM | POA: Diagnosis not present

## 2023-12-30 DIAGNOSIS — E1165 Type 2 diabetes mellitus with hyperglycemia: Secondary | ICD-10-CM | POA: Diagnosis not present

## 2023-12-30 DIAGNOSIS — Z1211 Encounter for screening for malignant neoplasm of colon: Secondary | ICD-10-CM | POA: Diagnosis not present

## 2023-12-30 DIAGNOSIS — Z6833 Body mass index (BMI) 33.0-33.9, adult: Secondary | ICD-10-CM

## 2023-12-30 DIAGNOSIS — R972 Elevated prostate specific antigen [PSA]: Secondary | ICD-10-CM

## 2023-12-30 DIAGNOSIS — E6609 Other obesity due to excess calories: Secondary | ICD-10-CM

## 2023-12-30 DIAGNOSIS — R351 Nocturia: Secondary | ICD-10-CM

## 2023-12-30 DIAGNOSIS — I2089 Other forms of angina pectoris: Secondary | ICD-10-CM

## 2023-12-30 DIAGNOSIS — I119 Hypertensive heart disease without heart failure: Secondary | ICD-10-CM | POA: Diagnosis not present

## 2023-12-30 DIAGNOSIS — E66811 Obesity, class 1: Secondary | ICD-10-CM

## 2023-12-30 NOTE — Patient Instructions (Signed)
 Health Maintenance, Male  Adopting a healthy lifestyle and getting preventive care are important in promoting health and wellness. Ask your health care provider about:  The right schedule for you to have regular tests and exams.  Things you can do on your own to prevent diseases and keep yourself healthy.  What should I know about diet, weight, and exercise?  Eat a healthy diet    Eat a diet that includes plenty of vegetables, fruits, low-fat dairy products, and lean protein.  Do not eat a lot of foods that are high in solid fats, added sugars, or sodium.  Maintain a healthy weight  Body mass index (BMI) is a measurement that can be used to identify possible weight problems. It estimates body fat based on height and weight. Your health care provider can help determine your BMI and help you achieve or maintain a healthy weight.  Get regular exercise  Get regular exercise. This is one of the most important things you can do for your health. Most adults should:  Exercise for at least 150 minutes each week. The exercise should increase your heart rate and make you sweat (moderate-intensity exercise).  Do strengthening exercises at least twice a week. This is in addition to the moderate-intensity exercise.  Spend less time sitting. Even light physical activity can be beneficial.  Watch cholesterol and blood lipids  Have your blood tested for lipids and cholesterol at 69 years of age, then have this test every 5 years.  You may need to have your cholesterol levels checked more often if:  Your lipid or cholesterol levels are high.  You are older than 69 years of age.  You are at high risk for heart disease.  What should I know about cancer screening?  Many types of cancers can be detected early and may often be prevented. Depending on your health history and family history, you may need to have cancer screening at various ages. This may include screening for:  Colorectal cancer.  Prostate cancer.  Skin cancer.  Lung  cancer.  What should I know about heart disease, diabetes, and high blood pressure?  Blood pressure and heart disease  High blood pressure causes heart disease and increases the risk of stroke. This is more likely to develop in people who have high blood pressure readings or are overweight.  Talk with your health care provider about your target blood pressure readings.  Have your blood pressure checked:  Every 3-5 years if you are 69-69 years of age.  Every year if you are 69 years old or older.  If you are between the ages of 60 and 72 and are a current or former smoker, ask your health care provider if you should have a one-time screening for abdominal aortic aneurysm (AAA).  Diabetes  Have regular diabetes screenings. This checks your fasting blood sugar level. Have the screening done:  Once every three years after age 66 if you are at a normal weight and have a low risk for diabetes.  More often and at a younger age if you are overweight or have a high risk for diabetes.  What should I know about preventing infection?  Hepatitis B  If you have a higher risk for hepatitis B, you should be screened for this virus. Talk with your health care provider to find out if you are at risk for hepatitis B infection.  Hepatitis C  Blood testing is recommended for:  Everyone born from [birth year] through 1966.  Anyone  with known risk factors for hepatitis C.  Sexually transmitted infections (STIs)  You should be screened each year for STIs, including gonorrhea and chlamydia, if:  You are sexually active and are younger than 69 years of age.  You are older than 69 years of age and your health care provider tells you that you are at risk for this type of infection.  Your sexual activity has changed since you were last screened, and you are at increased risk for chlamydia or gonorrhea. Ask your health care provider if you are at risk.  Ask your health care provider about whether you are at high risk for HIV. Your health care provider  may recommend a prescription medicine to help prevent HIV infection. If you choose to take medicine to prevent HIV, you should first get tested for HIV. You should then be tested every 3 months for as long as you are taking the medicine.  Follow these instructions at home:  Alcohol use  Do not drink alcohol if your health care provider tells you not to drink.  If you drink alcohol:  Limit how much you have to 0-2 drinks a day.  Know how much alcohol is in your drink. In the [place] one drink equals one 12 oz bottle of beer (355 mL), one 5 oz glass of wine (148 mL), or one 1 oz glass of hard liquor (44 mL).  Lifestyle  Do not use any products that contain nicotine or tobacco. These products include cigarettes, chewing tobacco, and vaping devices, such as e-cigarettes. If you need help quitting, ask your health care provider.  Do not use street drugs.  Do not share needles.  Ask your health care provider for help if you need support or information about quitting drugs.  General instructions  Schedule regular health, dental, and eye exams.  Stay current with your vaccines.  Tell your health care provider if:  You often feel depressed.  You have ever been abused or do not feel safe at home.  Summary  Adopting a healthy lifestyle and getting preventive care are important in promoting health and wellness.  Follow your health care provider's instructions about healthy diet, exercising, and getting tested or screened for diseases.  Follow your health care provider's instructions on monitoring your cholesterol and blood pressure.  This information is not intended to replace advice given to you by your health care provider. Make sure you discuss any questions you have with your health care provider.  Document Revised: 09/02/2020 Document Reviewed: 09/02/2020  Elsevier Patient Education  2024 [employer].

## 2023-12-30 NOTE — Progress Notes (Signed)
 I,Jameka J Llittleton, CMA,acting as a Neurosurgeon for [employer], NP.,have documented all relevant documentation on the behalf of [name], NP,as directed by  [name], NP while in the presence of [name], NP.  Subjective:   Patient ID: Thomas Rowland , male    DOB: 04-Jun-1955 , 68 y.o.   MRN: [id number]  Chief Complaint  Patient presents with   Annual Exam    The patient is here today for a physical examination. Reports compliance with meds.  He denies headaches, chest pain and shortness of breath. He has no specific concerns at this time.   Patient reports he is going to see a new eye dr in December. He is not satisfied with seeing Dr.whittaker.     HPI Discussed the use of AI scribe software for clinical note transcription with the patient, who gave verbal consent to proceed.  History of Present Illness      Thomas Rowland is a 68 year old male who presents for an annual physical exam.  He has no current concerns or issues during the visit. He is on medications for hypertension, atherosclerosis and diabetes. He checks his blood pressure at home. He also takes Plavix , prescribed by his cardiologist, although he is unsure of the specific reason for its prescription. No history of stroke.  He mentions a recurring foot problem that sometimes resolves with treatment but returns after several months. He uses a combination of 'holy butter' and a medicine purchased online to manage the condition, which helps remove the problematic area after a few days. He saw a food doctor more than a year ago for this issue but stopped visiting as the condition has not changed. He reports intermittent pain in his feet, sometimes painful when walking, and occasional knee pain. He does not wear socks or stockings because they make his feet hot and cause problems, and he prefers light footwear.  He experiences occasional coughing, which he associates with his blood pressure control. He notes that  when his blood pressure is well-controlled, his cough reduces. He quit smoking in 2003 after having smoked since 1967.  He has a history of gout, which is exacerbated by certain work activities involving heavy gripping. Avoiding these activities for three months helped alleviate the symptoms. Resuming these activities leads to a recurrence of gout symptoms.  He has had his eyes checked earlier this year and is due for another appointment. He previously experienced swelling from an eye drop, leading to a change in his eye care provider.  He reports compliance with meds.  He denies headaches, chest pain and shortness of breath. He has no specific concerns at this time.  Scheduled for eye appt with Dr Octavia in December 2025.  He is encouraged to strive for BMI less than 30 to decrease cardiac risk. Advised to aim for at least 150 minutes of exercise per week.   Hypertension This is a chronic problem. The current episode started more than 1 year ago. The problem has been gradually worsening since onset. The problem is uncontrolled. Pertinent negatives include no anxiety, blurred vision, headaches or palpitations. There are no associated agents to hypertension. Risk factors for coronary artery disease include obesity and diabetes mellitus. Past treatments include diuretics. The current treatment provides no improvement. There are no compliance problems.  There is no history of angina.  Diabetes He presents for his follow-up diabetic visit. He has type 2 diabetes mellitus. There are no hypoglycemic associated symptoms. Pertinent negatives for hypoglycemia  include no dizziness or headaches. Pertinent negatives for diabetes include no blurred vision, no fatigue, no polydipsia, no polyphagia and no polyuria. There are no hypoglycemic complications. Symptoms are stable. Risk factors for coronary artery disease include diabetes mellitus, hypertension, male sex and dyslipidemia. His weight is stable. He is  following a generally healthy diet. An ACE inhibitor/angiotensin II receptor blocker is being taken.     Past Medical History:  Diagnosis Date   Coronary artery disease    Diabetes mellitus without complication (HCC)    History of COVID-19    Hyperlipidemia    Hypertension    Nonspecific abnormal electrocardiogram (ECG) (EKG) 06/26/2013     Family History  Family history unknown: Yes     Current Outpatient Medications:    amLODipine  (NORVASC ) 10 MG tablet, Take 1 tablet (10 mg total) by mouth daily., Disp: 90 tablet, Rfl: 2   aspirin 81 MG EC tablet, Take 1 tablet by mouth daily., Disp: , Rfl:    atorvastatin  (LIPITOR) 40 MG tablet, Take 1 tablet (40 mg total) by mouth daily., Disp: 90 tablet, Rfl: 2   Cholecalciferol (VITAMIN D3) 25 MCG (1000 UT) CAPS, Take 1,000 Units by mouth daily., Disp: , Rfl:    clopidogrel  (PLAVIX ) 75 MG tablet, TAKE ONE TABLET BY MOUTH DAILY, Disp: 90 tablet, Rfl: 1   eplerenone  (INSPRA ) 25 MG tablet, Take 1 tablet (25 mg total) by mouth daily., Disp: 90 tablet, Rfl: 3   hydrALAZINE  (APRESOLINE ) 25 MG tablet, Take 1 tablet (25 mg total) by mouth 2 (two) times daily., Disp: 180 tablet, Rfl: 2   loratadine (CLARITIN) 10 MG tablet, Take 10 mg by mouth daily., Disp: , Rfl:    metFORMIN  (GLUCOPHAGE ) 500 MG tablet, TAKE ONE TABLET BY MOUTH EVERY DAY WITH breakfast, Disp: 90 tablet, Rfl: 2   metoprolol  succinate (TOPROL -XL) 25 MG 24 hr tablet, TAKE ONE TABLET BY MOUTH EVERY DAY ** Hold if FOR BLOOD PRESSURE is less THAN OR pulse less THAN 60 br=eats PER MINUTES, Disp: 90 tablet, Rfl: 1   MITIGARE  0.6 MG CAPS, Take 1 capsule by mouth 3 (three) times daily as needed., Disp: 30 capsule, Rfl: 2   Multiple Vitamin (ONE-DAILY MULTI VITAMINS PO), Take by mouth., Disp: , Rfl:    nitroGLYCERIN  (NITROSTAT ) 0.4 MG SL tablet, Place 1 tablet (0.4 mg total) under the tongue every 5 (five) minutes as needed for chest pain. If you require more than two tablets five minutes  apart go to the nearest ER via EMS., Disp: 30 tablet, Rfl: 0   sildenafil (REVATIO) 20 MG tablet, TAKE ONE TABLET BY MOUTH EVERY DAY AS NEEDED, Disp: 10 tablet, Rfl: 1   valsartan  (DIOVAN ) 160 MG tablet, Take 1 tablet (160 mg total) by mouth 2 (two) times daily., Disp: 180 tablet, Rfl: 1   Allergies  Allergen Reactions   Lisinopril Cough      Flowsheet Row Office Visit from 12/30/2023 in [facility]  PHQ-2 Total Score 0   Social History   Substance and Sexual Activity  Alcohol Use Yes   Comment: occ   Social History   Tobacco Use  Smoking Status Former   Current packs/day: 0.00   Average packs/day: 0.3 packs/day for 33.2 years (8.3 ttl pk-yrs)   Types: Cigarettes, E-cigarettes   Start date: 66   Quit date: 06/26/2001   Years since quitting: 22.5  Smokeless Tobacco Never  .   Review of Systems  Constitutional: Negative.  Negative for fatigue.  HENT: Negative.    Eyes:  Negative for blurred vision.  Respiratory: Negative.    Cardiovascular:  Negative for palpitations.  Endocrine: Negative for polydipsia, polyphagia and polyuria.  Genitourinary: Negative.   Musculoskeletal: Negative.   Neurological:  Negative for dizziness and headaches.  Psychiatric/Behavioral: Negative.       Today's Vitals   12/30/23 1508  BP: 130/70  Pulse: 69  Temp: 99.2 F (37.3 C)  TempSrc: Oral  Weight: 191 lb (86.6 kg)  Height: 5' 3 (1.6 m)  PainSc: 0-No pain   Body mass index is 33.83 kg/m.  Wt Readings from Last 3 Encounters:  12/30/23 191 lb (86.6 kg)  09/29/23 189 lb 3.2 oz (85.8 kg)  09/23/23 193 lb (87.5 kg)    Objective:  Physical Exam Constitutional:      Appearance: Normal appearance.  HENT:     Head: Normocephalic.     Nose: Nose normal.  Cardiovascular:     Rate and Rhythm: Normal rate and regular rhythm.     Pulses: Normal pulses.     Heart sounds: Normal heart sounds.  Pulmonary:     Effort: Pulmonary effort is normal.      Breath sounds: Normal breath sounds.  Abdominal:     General: Bowel sounds are normal.  Musculoskeletal:        General: Normal range of motion.  Skin:    General: Skin is warm and dry.  Neurological:     General: No focal deficit present.     Mental Status: He is alert and oriented to person, place, and time. Mental status is at baseline.  Psychiatric:        Mood and Affect: Mood normal.         Assessment And Plan:    Encounter for general adult medical examination w/o abnormal findings  Hypertensive heart disease without heart failure -     CBC -     CMP14+EGFR  Atherosclerosis of native coronary artery of native heart without angina pectoris Assessment & Plan: Continue statin therapy  Orders: -     Lipid panel  Type 2 diabetes mellitus with hyperglycemia, without long-term current use of insulin (HCC) Assessment & Plan: Chronic. Stable. Continue metformin  500 mg every day   Orders: -     Hemoglobin A1c  Nocturia -     PSA Total (Reflex To Free)  Screening for colon cancer -     Ambulatory referral to Gastroenterology  Class 1 obesity due to excess calories with serious comorbidity and body mass index (BMI) of 33.0 to 33.9 in adult Assessment & Plan: He is encouraged to strive for BMI less than 30 to decrease cardiac risk. Advised to aim for at least 150 minutes of exercise per week.    Abnormal PSA -     %fPSA Reflex -     Ambulatory referral to Urology   Assessment & Plan Adult Wellness Visit Annual exam completed. Blood pressure slightly elevated. Cough possibly linked to blood pressure control. No prostate cancer history. - Perform PSA test. - Conduct blood work for annual exam.  Type 2 diabetes mellitus Discussed diabetes management and foot care.  - Advise regular foot examinations and care.  Hypertension Discussed blood pressure management. On antihypertensive medication. .    Return for 1 year physical. Patient was given opportunity  to ask questions. Patient verbalized understanding of the plan and was able to repeat key elements of the plan. All questions were answered to their satisfaction.  I, [name], NP, have reviewed all documentation for this visit. The documentation on 01/11/2024 for the exam, diagnosis, procedures, and orders are all accurate and complete.

## 2023-12-31 LAB — LIPID PANEL
Chol/HDL Ratio: 2.7 ratio (ref 0.0–5.0)
Cholesterol, Total: 138 mg/dL (ref 100–199)
HDL: 52 mg/dL (ref >39)
LDL Chol Calc (NIH): 73 mg/dL (ref 0–99)
Triglycerides: 63 mg/dL (ref 0–149)
VLDL Cholesterol Cal: 13 mg/dL (ref 5–40)

## 2023-12-31 LAB — CBC
Hematocrit: 40.3 % (ref 37.5–51.0)
Hemoglobin: 12.6 g/dL — ABNORMAL LOW (ref 13.0–17.7)
MCH: 27 pg (ref 26.6–33.0)
MCHC: 31.3 g/dL — ABNORMAL LOW (ref 31.5–35.7)
MCV: 86 fL (ref 79–97)
Platelets: 174 x10E3/uL (ref 150–450)
RBC: 4.67 x10E6/uL (ref 4.14–5.80)
RDW: 12.8 % (ref 11.6–15.4)
WBC: 9.3 x10E3/uL (ref 3.4–10.8)

## 2023-12-31 LAB — CMP14+EGFR
ALT: 19 IU/L (ref 0–44)
AST: 22 IU/L (ref 0–40)
Albumin: 4.2 g/dL (ref 3.9–4.9)
Alkaline Phosphatase: 71 IU/L (ref 44–121)
BUN/Creatinine Ratio: 12 (ref 10–24)
BUN: 15 mg/dL (ref 8–27)
Bilirubin Total: 0.4 mg/dL (ref 0.0–1.2)
CO2: 21 mmol/L (ref 20–29)
Calcium: 9.3 mg/dL (ref 8.6–10.2)
Chloride: 106 mmol/L (ref 96–106)
Creatinine, Ser: 1.25 mg/dL (ref 0.76–1.27)
Globulin, Total: 2.9 g/dL (ref 1.5–4.5)
Glucose: 89 mg/dL (ref 70–99)
Potassium: 4.9 mmol/L (ref 3.5–5.2)
Sodium: 141 mmol/L (ref 134–144)
Total Protein: 7.1 g/dL (ref 6.0–8.5)
eGFR: 63 mL/min/1.73 (ref >59)

## 2023-12-31 LAB — PSA TOTAL (REFLEX TO FREE): Prostate Specific Ag, Serum: 4.5 ng/mL — ABNORMAL HIGH (ref 0.0–4.0)

## 2023-12-31 LAB — FPSA% REFLEX
% FREE PSA: 9.1 %
PSA, FREE: 0.41 ng/mL

## 2023-12-31 LAB — HEMOGLOBIN A1C
Est. average glucose Bld gHb Est-mCnc: 128 mg/dL
Hgb A1c MFr Bld: 6.1 % — ABNORMAL HIGH (ref 4.8–5.6)

## 2024-01-11 ENCOUNTER — Ambulatory Visit: Payer: Self-pay | Admitting: Family Medicine

## 2024-01-11 DIAGNOSIS — R351 Nocturia: Secondary | ICD-10-CM | POA: Insufficient documentation

## 2024-01-11 DIAGNOSIS — Z0000 Encounter for general adult medical examination without abnormal findings: Secondary | ICD-10-CM | POA: Insufficient documentation

## 2024-01-11 DIAGNOSIS — Z1211 Encounter for screening for malignant neoplasm of colon: Secondary | ICD-10-CM | POA: Insufficient documentation

## 2024-01-11 DIAGNOSIS — R972 Elevated prostate specific antigen [PSA]: Secondary | ICD-10-CM | POA: Insufficient documentation

## 2024-01-11 DIAGNOSIS — E66811 Other obesity due to excess calories: Secondary | ICD-10-CM | POA: Insufficient documentation

## 2024-01-11 NOTE — Assessment & Plan Note (Signed)
 Chronic. Stable. Continue metformin  500 mg every day

## 2024-01-11 NOTE — Progress Notes (Signed)
 Your A1c is 6.1, it went up from 5.7.  Please continue to take your medicines as prescribed and eat a low-carb diet with less sweets.  Your iron count is low, eat foods rich in iron for example dark green leafy vegetables like spinach, kale, collard greens etc.  Electrolytes, kidney function are fine, cholesterol levels are normal.  PSA, the test for prostrate cancer came back as abnormal but with further testing the result came back normal .I am going to refer you to a urologist anyway to continue to monitor your levels.  Thank you

## 2024-01-11 NOTE — Assessment & Plan Note (Signed)
 He is encouraged to strive for BMI less than 30 to decrease cardiac risk. Advised to aim for at least 150 minutes of exercise per week.

## 2024-01-11 NOTE — Assessment & Plan Note (Signed)
 Continue statin therapy

## 2024-01-30 NOTE — Progress Notes (Deleted)
    Chief Complaint: Abnormal PSA test  History of Present Illness:  Thomas Rowland is a 68 y.o. male who is seen in consultation from Jarold Medici, MD for evaluation of elevated PSA.   Prior PSA data:  12/2023--4.5 (9% free) 08/2022--3.6 08/2021--4.0 07/2020--3.5  Past Medical History:  Past Medical History:  Diagnosis Date   Coronary artery disease    Diabetes mellitus without complication (HCC)    History of COVID-19    Hyperlipidemia    Hypertension    Nonspecific abnormal electrocardiogram (ECG) (EKG) 06/26/2013    Past Surgical History:  Past Surgical History:  Procedure Laterality Date   COLONOSCOPY  2014   Normal   CORONARY ANGIOPLASTY WITH STENT PLACEMENT     LEG SURGERY Left 1997   [place]    Allergies:  Allergies  Allergen Reactions   Lisinopril Cough    Family History:  Family History  Family history unknown: Yes    Social History:  Social History   Tobacco Use   Smoking status: Former    Current packs/day: 0.00    Average packs/day: 0.3 packs/day for 33.2 years (8.3 ttl pk-yrs)    Types: Cigarettes, E-cigarettes    Start date: 28    Quit date: 06/26/2001    Years since quitting: 22.6   Smokeless tobacco: Never  Vaping Use   Vaping status: Never Used  Substance Use Topics   Alcohol use: Yes    Comment: occ   Drug use: No    Review of symptoms:  Constitutional:  Negative for unexplained weight loss, night sweats, fever, chills ENT:  Negative for nose bleeds, sinus pain, painful swallowing CV:  Negative for chest pain, shortness of breath, exercise intolerance, palpitations, loss of consciousness Resp:  Negative for cough, wheezing, shortness of breath GI:  Negative for nausea, vomiting, diarrhea, bloody stools GU:  Positives noted in HPI; otherwise negative for gross hematuria, dysuria, urinary incontinence Neuro:  Negative for seizures, poor balance, limb weakness, slurred speech Psych:  Negative for lack of energy, depression,  anxiety Endocrine:  Negative for polydipsia, polyuria, symptoms of hypoglycemia (dizziness, hunger, sweating) Hematologic:  Negative for anemia, purpura, petechia, prolonged or excessive bleeding, use of anticoagulants  Allergic:  Negative for difficulty breathing or choking as a result of exposure to anything; no shellfish allergy; no allergic response (rash/itch) to materials, foods  Physical exam: There were no vitals taken for this visit. GENERAL APPEARANCE:  Well appearing, well developed, well nourished, NAD HEENT: Atraumatic, Normocephalic. NECK: Normal appearance LUNGS: Normal inspiratory and expiratory excursion HEART: Regular Rate ABDOMEN: ***. GU: Phallus normal, no lesions. Scrotal skin normal. Testicles/epididymal structures normal. Meatus normal. Normal anal sphincter tone, prostate ***mL, symmetric, non nodular, non tender. EXTREMITIES: Moves all extremities well.  Without clubbing, cyanosis, or edema. NEUROLOGIC:  Alert and oriented x 3, normal gait, CN II-XII grossly intact.  MENTAL STATUS:  Appropriate. SKIN:  Warm, dry and intact.    Results: No results found for this or any previous visit (from the past 24 hours).  I have reviewed referring/prior physicians notes  I have reviewed urinalysis  I have reviewed PSA results  I have reviewed prior imaging--CT pelvis from 03/2006--prostate size nml  I have reviewed urine culture results  Assessment: ***   Plan: ***

## 2024-01-31 ENCOUNTER — Ambulatory Visit: Admitting: Urology

## 2024-01-31 DIAGNOSIS — R972 Elevated prostate specific antigen [PSA]: Secondary | ICD-10-CM

## 2024-02-03 ENCOUNTER — Other Ambulatory Visit: Payer: Self-pay | Admitting: Internal Medicine

## 2024-02-03 DIAGNOSIS — I119 Hypertensive heart disease without heart failure: Secondary | ICD-10-CM

## 2024-02-07 ENCOUNTER — Other Ambulatory Visit: Payer: Self-pay | Admitting: Internal Medicine

## 2024-02-13 NOTE — Progress Notes (Signed)
 Chief Complaint: Abnormal PSA test  History of Present Illness:  Thomas Rowland is a 68 y.o. male who is seen in consultation from Jarold Medici, MD for evaluation of elevated PSA.   Prior PSA data:  12/2023--4.5 (9% free) 08/2022--3.6 08/2021--4.0 07/2020--3.5 7.6.2020--2.66  That he knows, he has never been treated for prostate infection before.  He has not been told that he had a large prostate.  No family history of prostate cancer.  No prior GU history.  IPSS 6/2.  Past Medical History:  Past Medical History:  Diagnosis Date   Coronary artery disease    Diabetes mellitus without complication (HCC)    History of COVID-19    Hyperlipidemia    Hypertension    Nonspecific abnormal electrocardiogram (ECG) (EKG) 06/26/2013    Past Surgical History:  Past Surgical History:  Procedure Laterality Date   COLONOSCOPY  2014   Normal   CORONARY ANGIOPLASTY WITH STENT PLACEMENT     LEG SURGERY Left 1997   [place]    Allergies:  Allergies  Allergen Reactions   Lisinopril Cough    Family History:  Family History  Family history unknown: Yes    Social History:  Social History   Tobacco Use   Smoking status: Former    Current packs/day: 0.00    Average packs/day: 0.3 packs/day for 33.2 years (8.3 ttl pk-yrs)    Types: Cigarettes, E-cigarettes    Start date: 70    Quit date: 06/26/2001    Years since quitting: 22.6   Smokeless tobacco: Never  Vaping Use   Vaping status: Never Used  Substance Use Topics   Alcohol use: Yes    Comment: occ   Drug use: No    Review of symptoms:  Constitutional:  Negative for unexplained weight loss, night sweats, fever, chills ENT:  Negative for nose bleeds, sinus pain, painful swallowing CV:  Negative for chest pain, shortness of breath, exercise intolerance, palpitations, loss of consciousness Resp:  Negative for cough, wheezing, shortness of breath GI:  Negative for nausea, vomiting, diarrhea, bloody stools GU:   Positives noted in HPI; otherwise negative for gross hematuria, dysuria, urinary incontinence Neuro:  Negative for seizures, poor balance, limb weakness, slurred speech Psych:  Negative for lack of energy, depression, anxiety Endocrine:  Negative for polydipsia, polyuria, symptoms of hypoglycemia (dizziness, hunger, sweating) Hematologic:  Negative for anemia, purpura, petechia, prolonged or excessive bleeding, use of anticoagulants  Allergic:  Negative for difficulty breathing or choking as a result of exposure to anything; no shellfish allergy; no allergic response (rash/itch) to materials, foods  Physical exam: There were no vitals taken for this visit. GENERAL APPEARANCE:  Well appearing, well developed, well nourished, NAD HEENT: Atraumatic, Normocephalic. NECK: Normal appearance LUNGS: Normal inspiratory and expiratory excursion HEART: Regular Rate ABDOMEN: No inguinal hernias. GU: Phallus normal, no lesions. Scrotal skin normal. Testicles/epididymal structures normal. Meatus normal. Normal anal sphincter tone, prostate 40 mL, symmetric, non nodular, non tender. EXTREMITIES: Moves all extremities well.  Without clubbing, cyanosis, or edema. NEUROLOGIC:  Alert and oriented x 3, normal gait, CN II-XII grossly intact.  MENTAL STATUS:  Appropriate. SKIN:  Warm, dry and intact.    Results:  I have reviewed referring/prior physicians notes  I have reviewed urinalysis--clear  I have reviewed PSA results  I have reviewed prior imaging--CT pelvis from 03/2006--prostate size nml    Assessment: Elevated PSA with a slightly increasing trend over the past 5 years.  Benign exam   Plan: -I will  check an iso-PSA today  - Follow-up dependent on that

## 2024-02-14 ENCOUNTER — Ambulatory Visit (INDEPENDENT_AMBULATORY_CARE_PROVIDER_SITE_OTHER): Admitting: Urology

## 2024-02-14 ENCOUNTER — Encounter: Payer: Self-pay | Admitting: Urology

## 2024-02-14 VITALS — BP 121/77 | HR 75 | Ht 68.0 in | Wt 180.0 lb

## 2024-02-14 DIAGNOSIS — R972 Elevated prostate specific antigen [PSA]: Secondary | ICD-10-CM

## 2024-02-14 LAB — URINALYSIS, ROUTINE W REFLEX MICROSCOPIC
Bilirubin, UA: NEGATIVE
Glucose, UA: NEGATIVE
Leukocytes,UA: NEGATIVE
Nitrite, UA: NEGATIVE
RBC, UA: NEGATIVE
Specific Gravity, UA: 1.015 (ref 1.005–1.030)
Urobilinogen, Ur: 2 mg/dL — ABNORMAL HIGH (ref 0.2–1.0)
pH, UA: 7 (ref 5.0–7.5)

## 2024-02-14 LAB — MICROSCOPIC EXAMINATION: Bacteria, UA: NONE SEEN

## 2024-02-14 LAB — BLADDER SCAN AMB NON-IMAGING: Scan Result: 16

## 2024-02-17 ENCOUNTER — Encounter: Payer: Self-pay | Admitting: Urology

## 2024-02-23 NOTE — Progress Notes (Signed)
 PSA on 1.12.2022--5.0 @ PCP office  Iso-PSA result called to patient's family member.  We will set up ultrasound and biopsy.

## 2024-02-24 ENCOUNTER — Telehealth: Payer: Self-pay

## 2024-02-24 NOTE — Telephone Encounter (Signed)
-----   Message from Garnette HERO Dahlstedt sent at 02/23/2024  5:16 PM EDT ----- I called a family member about the iso-PSA result.  Patient needs ultrasound and biopsy.  Please set up. ----- Message ----- From: Carolee Madelin HERO Sent: 02/17/2024   9:01 AM EDT To: Garnette Shack, MD

## 2024-02-24 NOTE — Telephone Encounter (Signed)
 [facility] requesting a return call.

## 2024-02-29 NOTE — Telephone Encounter (Signed)
[facility] requested a return call.

## 2024-03-02 ENCOUNTER — Other Ambulatory Visit: Payer: Self-pay

## 2024-03-02 DIAGNOSIS — R972 Elevated prostate specific antigen [PSA]: Secondary | ICD-10-CM

## 2024-03-02 NOTE — Telephone Encounter (Signed)
 Pt called back to schedule and lvm  I called pt and lvm 03/02/2024

## 2024-03-03 ENCOUNTER — Ambulatory Visit: Admitting: Cardiology

## 2024-03-07 ENCOUNTER — Encounter: Payer: Self-pay | Admitting: Podiatry

## 2024-03-07 ENCOUNTER — Ambulatory Visit

## 2024-03-07 ENCOUNTER — Ambulatory Visit: Admitting: Podiatry

## 2024-03-07 DIAGNOSIS — L84 Corns and callosities: Secondary | ICD-10-CM

## 2024-03-07 DIAGNOSIS — L988 Other specified disorders of the skin and subcutaneous tissue: Secondary | ICD-10-CM | POA: Diagnosis not present

## 2024-03-07 DIAGNOSIS — Z7901 Long term (current) use of anticoagulants: Secondary | ICD-10-CM

## 2024-03-07 DIAGNOSIS — M7752 Other enthesopathy of left foot: Secondary | ICD-10-CM

## 2024-03-07 MED ORDER — GENTAMICIN SULFATE 0.1 % EX OINT: 1.0000 | TOPICAL_OINTMENT | Freq: Three times a day (TID) | CUTANEOUS | 1 refills | Status: DC

## 2024-03-07 NOTE — Progress Notes (Unsigned)
 Subjective:   Patient ID: Thomas Rowland, male   DOB: 68 y.o.   MRN: [id number]   HPI Chief Complaint  Patient presents with   Callouses    Left big toe callous on medial side patient also has left soft corn between 4th and  5th toe very painful and tender.   68 year old male presents the [place] above concerns.  He states he gets painful skin lesions to both of his big toes as well as the arch of the left foot x 2.  He is also on exam tenderness on the fourth interspace of the left foot.  His other concerns that he has been getting pain to his left big toe joint.  States it hurts when he tries to bend it.  It was hurting yesterday but not today.  No injuries that he reports.  He is on Plavix .    Review of Systems  All other systems reviewed and are negative.       Objective:  Physical Exam  General: AAO x3, NAD  Dermatological: Thick hyperkeratotic lesions noted on the arch of the left foot x 2 as well as hyperkeratotic lesions along the hallux bilaterally.  There is no underlying ulceration, drainage or signs of infection.  No foreign object is present.  Macerated tissue along the fourth interspace left foot.  There is no drainage or pus or signs of infection.  No open lesions.  Vascular: Dorsalis Pedis artery and Posterior Tibial artery pedal pulses are 2/4 bilateral with immedate capillary fill time. There is no pain with calf compression, swelling, warmth, erythema.   Neruologic: Grossly intact via light touch bilateral.   Musculoskeletal: Unable to appreciate any tenderness left first MTPJ there is no pain with range of motion today.  This is where he was getting discomfort.  No erythema or warmth.       Assessment:   Capsulitis left first MTPJ; hyperkeratotic lesions; skin maceration     Plan:  -Treatment options discussed including all alternatives, risks, and complications -Etiology of symptoms were discussed -X-rays were obtained and reviewed with the patient.   Multiple views obtained.  No evidence of acute fracture.  Old fracture, deformity noted to the tibia, fibula.  Arthritic changes present navicular cuneiform joint.  Flattening the first metatarsal head. -Sharply debrided the hyperkeratotic lesions x 4 without any complications.  -Regards to his left first MPJ pain there is no pain today.  Discussed Voltaren  gel, supportive shoes, inserts to help support the foot. -Sharp debrided hyperkeratotic lesions running complications or bleeding.  Recommend urea cream. -Prescribed gentamicin ointment for the left fourth interspace.  Discussed drying thoroughly between his toes and changing shoes and socks regularly.    Return in about 6 weeks (around 04/18/2024), or if symptoms worsen or fail to improve.  Donnice JONELLE Fees DPM

## 2024-03-07 NOTE — Patient Instructions (Addendum)
 You the gentamicin ointment between the left 4th and 5th toe. Do not put any moisturizer between the toes.   You can use VOLTAREN  GEL on the big toe joint if needed. You can get this over the counter.   If it is not improving in the next 3-4 weeks, please let me know or sooner if there is any worsening.   Monitor for any signs/symptoms of infection. Call the office immediately if any occur or go directly to the emergency room. Call with any questions/concerns.

## 2024-03-16 ENCOUNTER — Other Ambulatory Visit: Payer: Self-pay | Admitting: Cardiology

## 2024-03-16 ENCOUNTER — Other Ambulatory Visit: Payer: Self-pay | Admitting: Internal Medicine

## 2024-03-16 DIAGNOSIS — I10 Essential (primary) hypertension: Secondary | ICD-10-CM

## 2024-03-16 DIAGNOSIS — I119 Hypertensive heart disease without heart failure: Secondary | ICD-10-CM

## 2024-03-29 ENCOUNTER — Other Ambulatory Visit: Admitting: Urology

## 2024-04-04 NOTE — Procedures (Unsigned)
 TRANSRECTAL ULTRASOUND AND PROSTATE BIOPSY  Indication:  Elevated PSA  Prophylactic antibiotic administration: Rocephin   All medications that could result in increased bleeding were discontinued within an appropriate period of the time of biopsy.  Risk including bleeding and infection were discussed.  Informed consent was obtained.  The patient was placed in the left lateral decubitus position.  PROCEDURE 1.  TRANSRECTAL ULTRASOUND OF THE PROSTATE  The 7 MHz transrectal probe was used to image the prostate.  Anal stenosis {WAS/WAS [phone]::was not} noted.  TRUS volume: *** ml  Hypoechoic areas: {prostate anatomy:26400}  Hyperechoic areas: {prostate anatomy:26400}  Central calcifications: {DESC; PRESENT/NOT PRESENT:21021351}  Margins:  {Normal/Abnormal Appearance:21344::normal}  Seminal Vesicles: {Normal/Abnormal Appearance:21344::normal}   PROCEDURE 2:  PROSTATE BIOPSY  A periprostatic block was performed using 1% lidocaine and transrectal ultrasound guidance. Under transrectal ultrasound guidance, and using the Biopty gun, prostate biopsies were obtained systematically from the apex, mid gland, and base bilaterally.  A total of *** cores were obtained.  Hemostasis was obtained with gentle pressure on the prostate.  The procedures were well-tolerated.  No significant bleeding was noted at the end of the procedure.  The patient was stable for discharge from the office.

## 2024-04-05 ENCOUNTER — Ambulatory Visit: Admitting: Urology

## 2024-04-05 ENCOUNTER — Encounter: Payer: Self-pay | Admitting: Urology

## 2024-04-05 ENCOUNTER — Other Ambulatory Visit (HOSPITAL_BASED_OUTPATIENT_CLINIC_OR_DEPARTMENT_OTHER): Payer: Self-pay | Admitting: Urology

## 2024-04-05 ENCOUNTER — Telehealth (HOSPITAL_BASED_OUTPATIENT_CLINIC_OR_DEPARTMENT_OTHER): Payer: Self-pay | Admitting: *Deleted

## 2024-04-05 ENCOUNTER — Ambulatory Visit (HOSPITAL_BASED_OUTPATIENT_CLINIC_OR_DEPARTMENT_OTHER)
Admission: RE | Admit: 2024-04-05 | Discharge: 2024-04-05 | Disposition: A | Discharge status: Home / Self Care | Source: Ambulatory Visit | Attending: Urology | Admitting: Urology

## 2024-04-05 DIAGNOSIS — R972 Elevated prostate specific antigen [PSA]: Secondary | ICD-10-CM

## 2024-04-05 LAB — MICROSCOPIC EXAMINATION

## 2024-04-05 LAB — URINALYSIS, ROUTINE W REFLEX MICROSCOPIC
Bilirubin, UA: NEGATIVE
Glucose, UA: NEGATIVE
Ketones, UA: NEGATIVE
Leukocytes,UA: NEGATIVE
Nitrite, UA: NEGATIVE
Specific Gravity, UA: 1.02 (ref 1.005–1.030)
Urobilinogen, Ur: 0.2 mg/dL (ref 0.2–1.0)
pH, UA: 6 (ref 5.0–7.5)

## 2024-04-05 MED ORDER — CEFTRIAXONE SODIUM 1 G IJ SOLR
1.0000 g | Freq: Once | INTRAMUSCULAR | Status: AC
  Administered 2024-04-05: 1 g via INTRAMUSCULAR

## 2024-04-05 NOTE — Telephone Encounter (Signed)
° °  Pre-operative Risk Assessment    Patient Name: Thomas Rowland  DOB: June 09, 1955 MRN: [id number]   Date of last office visit: 03/02/23 DR. TOLIA Date of next office visit: 04/07/24 DR. TOLIA    Request for Surgical Clearance    Procedure:  PROSTATE Bx  Date of Surgery:  Clearance 04/26/24                                Surgeon:  DR. CANDIE SHACK Surgeon's Group or Practice Name:  [facility] Phone number:  [phone] Fax number:  [phone]   Type of Clearance Requested:   - Medical  - Pharmacy:  Hold Aspirin and Clopidogrel  (Plavix ) x 3 DAYS PRIOR    Type of Anesthesia:  Not Indicated   Additional requests/questions:    [name]   04/05/2024, 11:27 AM

## 2024-04-05 NOTE — Progress Notes (Unsigned)
 IM Injection  Patient is present today for an IM Injection for treatment of infection prevention post prostate biopsy Drug: Ceftriaxone  Dose:1g Location:right upper outer buttocks Lot: [id number] Exp:11/23/2025 Patient tolerated well, no complications were noted  Performed by: Rhythm Gubbels CMA

## 2024-04-05 NOTE — Telephone Encounter (Signed)
° °  Name: Thomas Rowland  DOB: January 01, 1956  MRN: [id number]  Primary Cardiologist: Madonna Large, DO  Chart reviewed as part of pre-operative protocol coverage. Because of Thomas Rowland's past medical history and time since last visit, he will require a follow-up in-office visit in order to better assess preoperative cardiovascular risk.  Pre-op covering staff: - Please schedule appointment and call patient to inform them. If patient already had an upcoming appointment within acceptable timeframe, please add pre-op clearance to the appointment notes so provider is aware. - Please contact requesting surgeon's office via preferred method (i.e, phone, fax) to inform them of need for appointment prior to surgery.  Patient has appointment with Dr. Large on 04/07/24. I have adjusted appointment notes to reflect need for preop evaluation    [name], PA-C  04/05/2024, 11:37 AM

## 2024-04-05 NOTE — Telephone Encounter (Signed)
 Patient will be seen IN OFFICE 04/07/24 with Dr. Michele and at that time preop clearance will be addressed.  Will update surgeons office.

## 2024-04-07 ENCOUNTER — Encounter: Payer: Self-pay | Admitting: Cardiology

## 2024-04-07 ENCOUNTER — Ambulatory Visit: Attending: Cardiology | Admitting: Cardiology

## 2024-04-07 VITALS — BP 142/74 | HR 69 | Ht 64.0 in | Wt 185.0 lb

## 2024-04-07 DIAGNOSIS — Z87891 Personal history of nicotine dependence: Secondary | ICD-10-CM

## 2024-04-07 DIAGNOSIS — I2510 Atherosclerotic heart disease of native coronary artery without angina pectoris: Secondary | ICD-10-CM

## 2024-04-07 DIAGNOSIS — E119 Type 2 diabetes mellitus without complications: Secondary | ICD-10-CM

## 2024-04-07 DIAGNOSIS — Z8639 Personal history of other endocrine, nutritional and metabolic disease: Secondary | ICD-10-CM

## 2024-04-07 DIAGNOSIS — I10 Essential (primary) hypertension: Secondary | ICD-10-CM | POA: Diagnosis not present

## 2024-04-07 DIAGNOSIS — Z01810 Encounter for preprocedural cardiovascular examination: Secondary | ICD-10-CM

## 2024-04-07 DIAGNOSIS — Z955 Presence of coronary angioplasty implant and graft: Secondary | ICD-10-CM

## 2024-04-07 DIAGNOSIS — E782 Mixed hyperlipidemia: Secondary | ICD-10-CM

## 2024-04-07 MED ORDER — EPLERENONE 50 MG PO TABS: 50.0000 mg | ORAL_TABLET | Freq: Every day | ORAL | 3 refills | Status: AC

## 2024-04-07 NOTE — Progress Notes (Signed)
 Cardiology Office Note:  .   Date:  04/07/2024  ID:  Thomas, Rowland Sep 02, 1955, MRN [id number] PCP:  Jarold Medici, MD  Former Cardiology Providers: Dr. Gordy Bergamo and Dr. Herlene Dasie Hasten.  [facility] Providers Cardiologist:  Madonna Large, [place] , [facility] (established care June 2021) Electrophysiologist:  None  Click to update primary MD,subspecialty MD or APP then REFRESH:1}    Chief Complaint  Patient presents with   Follow-up    Coronary disease, preop    History of Present Illness: Thomas Rowland is a 68 y.o. 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   In March 2020 while he was seeing Dr. Herlene Hasten he had an abnormal nuclear stress test and underwent elective angiography and was found to have obstructive CAD.  He has had coronary interventions to both the RCA (2 PCI) and the LCx (1 PCI).   He reestablished care in June 2021 and since then has been treated medically.  In May 2022 he had a stress test which was reported to be high risk due to reduced LVEF by gated SPECT, dilated LV cavity, and presence of both fixed and possible small size reversible perfusion defect.  After long discussion with regards to angiography versus medical management patient preferred medical therapy  as he was asymptomatic.  Since May 2022 patient has not had anginal chest pain or the need for angiography.  He presents today for 22-month follow-up visit.  He is being considered for prostate biopsy and wants further recommendations for preprocedural risk stratification and antiplatelet management.  The procedure will be with Dr. Matilda on 04/26/2024.  They are requesting holding aspirin and Plavix  for 3 days  prior to procedure.  Over the last 1 year patient denies anginal chest pain or heart failure symptoms. No use of sublingual nitroglycerin  tablet. Overall functional capacity remains relatively stable. Patient states that he is planning to have a urological procedure and needs to hold aspirin and Plavix  for 3 days prior. Home blood pressures are ranging between 140-150 mmHg on current therapy.  Review of Systems: .   Review of Systems  Cardiovascular:  Negative for chest pain, claudication, irregular heartbeat, leg swelling, near-syncope, orthopnea, palpitations, paroxysmal nocturnal dyspnea and syncope.  Respiratory:  Negative for shortness of breath.   Hematologic/Lymphatic: Negative for bleeding problem.    Studies Reviewed:   EKG: EKG Interpretation Date/Time:  Friday April 07 2024 16:32:14 EST Ventricular Rate:  69 PR Interval:  172 QRS Duration:  98 QT Interval:  384 QTC Calculation: 411 R Axis:   11  Text Interpretation: Normal sinus rhythm with sinus arrhythmia Nonspecific T wave abnormality When compared with ECG of 02-Mar-2023 16:11, No significant change was found Confirmed by Large Madonna [phone]) on 04/07/2024 4:47:52 PM  Echocardiogram: 10/20/2019: LVEF 50-55%, moderate LVH, indeterminate diastolic filling pattern, mildly dilated left atrium, mild AR, mild MR, mild TR, mild PR.   Stress Testing: Lexiscan Sestamibi stress test 09/11/2020: Small size, severe intensity, fixed perfusion defect consistent with prior infarct in the RCA distribution. Small size, mild intensity, reversible perfusion defect consistent with either reversible ischemia or peri-infarct ischemia in the LCx / RCA distribution. Severely reduced LVEF, calculated LVEF 24%.  Wall motion illustrates Global hypokinesia with hypokinetic inferior segments.  High risk study (severely reduced LVEF,  dilated LV, presence of both fixed and small size perfusion defect). Clinical correlation required.  Compared  to prior outside records from 06/15/2019: Noted potential small area of pharmacologically induced ischemia involving the mid aspect of the inferior wall of the left ventricle. LVEF 50%, mild hypokinesis involving the inferior wall.    Heart Catheterization: 07/15/2018 LHC with Dr. Maude at [facility] (care everywhere): Angiography revealed mild LAD, 80% mLCx, 95% mRCA. Stented RCA with 3.5x18 and 3.5x12 Onyx DES with good angiographic result. Stented LCX with a 3.5x15 Onyx DES with good angiographic result.  RADIOLOGY: N/A  Risk Assessment/Calculations:   N/A   Labs:       Latest Ref Rng & Units 12/30/2023    4:20 PM 09/23/2023    3:51 PM 09/16/2022    4:29 PM  CBC  WBC 3.4 - 10.8 x10E3/uL 9.3  9.3  7.6   Hemoglobin 13.0 - 17.7 g/dL 87.3  87.4  86.9   Hematocrit 37.5 - 51.0 % 40.3  39.4  39.6   Platelets 150 - 450 x10E3/uL 174  162  152        Latest Ref Rng & Units 12/30/2023    4:20 PM 09/23/2023    3:51 PM 03/15/2023    4:30 PM  BMP  Glucose 70 - 99 mg/dL 89  875  72   BUN 8 - 27 mg/dL 15  21  21    Creatinine 0.76 - 1.27 mg/dL 8.74  8.74  8.57   BUN/Creat Ratio 10 - 24 12  17  15    Sodium 134 - 144 mmol/L 141  136  140   Potassium 3.5 - 5.2 mmol/L 4.9  4.4  4.6   Chloride 96 - 106 mmol/L 106  100  101   CO2 20 - 29 mmol/L 21  21  19    Calcium  8.6 - 10.2 mg/dL 9.3  8.9  9.2       Latest Ref Rng & Units 12/30/2023    4:20 PM 09/23/2023    3:51 PM 03/15/2023    4:30 PM  CMP  Glucose 70 - 99 mg/dL 89  875  72   BUN 8 - 27 mg/dL 15  21  21    Creatinine 0.76 - 1.27 mg/dL 8.74  8.74  8.57   Sodium 134 - 144 mmol/L 141  136  140   Potassium 3.5 - 5.2 mmol/L 4.9  4.4  4.6   Chloride 96 - 106 mmol/L 106  100  101   CO2 20 - 29 mmol/L 21  21  19    Calcium  8.6 - 10.2 mg/dL 9.3  8.9  9.2   Total Protein 6.0 - 8.5 g/dL 7.1  6.7    Total Bilirubin 0.0 - 1.2 mg/dL 0.4  0.2    Alkaline Phos 44 - 121 IU/L 71  67    AST 0 - 40 IU/L 22  22    ALT 0 - 44 IU/L 19  20      Lab Results   Component Value Date   CHOL 138 12/30/2023   HDL 52 12/30/2023   LDLCALC 73 12/30/2023   TRIG 63 12/30/2023   CHOLHDL 2.7 12/30/2023   Recent Labs    09/23/23 1551  LIPOA 169.6*   No components found for: NTPROBNP No results for input(s): PROBNP in the last 8760 hours. Recent Labs    09/23/23 1551  TSH 1.500     Physical Exam:    Today's Vitals   04/07/24 1628  BP: (!) 142/74  Pulse: 69  Weight: 185 lb (83.9 kg)  Height: 5' 4 (1.626 m)   Body mass index is 31.76 kg/m. Wt Readings from Last 3 Encounters:  04/07/24 185 lb (83.9 kg)  02/14/24 180 lb (81.6 kg)  12/30/23 191 lb (86.6 kg)    Physical Exam  Constitutional: No distress.  Age appropriate, hemodynamically stable.   Neck: No JVD present.  Cardiovascular: Normal rate, regular rhythm, S1 normal, S2 normal, intact distal pulses and normal pulses. Exam reveals no gallop, no S3, no S4 and no friction rub.  No murmur heard. Pulses:      Dorsalis pedis pulses are 2+ on the right side and 2+ on the left side.       Posterior tibial pulses are 2+ on the right side and 2+ on the left side.  Pulmonary/Chest: Effort normal and breath sounds normal. No stridor. He has no wheezes. He has no rales. He exhibits no tenderness.  Abdominal: Soft. Bowel sounds are normal. He exhibits no distension. There is no abdominal tenderness.  Musculoskeletal:        General: No tenderness or edema.     Cervical back: Neck supple.  Neurological: He is alert and oriented to person, place, and time. He has intact cranial nerves (2-12).  Skin: Skin is warm and moist.     Impression & Recommendation(s):  Impression:   ICD-10-CM   1. Atherosclerosis of native coronary artery of native heart without angina pectoris  I25.10 EKG 12-Lead    Basic Metabolic Panel (BMET)    2. History of coronary angioplasty with insertion of stent  Z95.5     3. Preprocedural cardiovascular examination  Z01.810     4. Hx of hyperaldosteronism   Z86.39     5. Essential hypertension  I10     6. Mixed hyperlipidemia  E78.2     7. Non-insulin dependent type 2 diabetes mellitus (HCC)  E11.9     8. Former smoker  Z87.891        Recommendation(s):  Atherosclerosis of native coronary artery of native heart without angina pectoris History of coronary angioplasty with insertion of stent Denies angina pectoris. EKG does not illustrate myocardial injury pattern, similar findings to prior tracing No use of sublingual nitroglycerin  tablets since the last office visit. Currently on aspirin and Plavix  given the degree of CAD. Continue atorvastatin  40 mg p.o. nightly LDL 73 mg/dL as of September 2025, [facility] database Echo in June 2021: Preserved LVEF, indeterminate diastolic filling pattern, mild valvular heart disease. MPI May 2022: Study due to dilated LV cavity, reduced LVEF by gated SPECT image, and presence of this and small size reversible perfusion defect.  He has remained asymptomatic since 2022 with regards to chest pain or anginal equivalents. Reemphasized the importance of secondary prevention with focus on improving her modifiable cardiovascular risk factors such as glycemic control, lipid management, blood pressure control, weight loss.  Preprocedural examination: Patient is being considered for prostate biopsy with his urologist on 04/26/2024 They are requesting holding aspirin and Plavix  3 days prior to the procedure. Clinically denies anginal chest pain or heart failure symptoms. EKG shows sinus rhythm with ST-T changes similar to prior tracing Able to perform greater than 4 METS of activity level Patient has CAD with prior interventions but currently asymptomatic and on medical therapy. Patient is considered acceptable risk for upcoming noncardiac procedure.  Patient is informed that he can hold aspirin and Plavix  3 days prior to his procedure as requested  and to restart once clinically safe/cleared by his urologist  postprocedure.  Patient agreeable with the plan of care.  Essential hypertension Hx of hyperaldosteronism Office and home blood pressures are not well-controlled. Home SBP ranges between 140-150 mmHg on current regimen. Continue amlodipine  10 mg p.o. q. lunch. Continue hydralazine  25 mg p.o. twice daily. Continue Toprol -XL 25 mg p.o. q. lunch. Continue valsartan  160 mg p.o. twice daily, he prefers taking it twice daily as opposed to daily  Increase eplerenone  from 25 mg p.o. every morning to 50 mg p.o. every morning  BMP in one week to check renal function and electrolytes   Mixed hyperlipidemia Currently on Lipitor 40 mg p.o. daily.   He denies myalgia or other side effects.   Most recent lipid profile from September 2025 reviewed via [facility] database LDL 73 mg/dL  Non-insulin dependent type 2 diabetes mellitus (HCC) Hemoglobin A1c 6.1, September 2025 Given his diabetes/CAD discussed GLP-1 agonists in the past patient has refused due to avoiding subcutaneous injections. Continue ARB, statin therapy May consider initiation of Jardiance for both renal and cardioprotective properties. Currently being managed by PCP  Orders Placed:  Orders Placed This Encounter  Procedures   Basic Metabolic Panel (BMET)   EKG 12-Lead   Final Medication List:    Meds ordered this encounter  Medications   eplerenone  (INSPRA ) 50 MG tablet    Sig: Take 1 tablet (50 mg total) by mouth daily.    Dispense:  90 tablet    Refill:  3    Medications Discontinued During This Encounter  Medication Reason   eplerenone  (INSPRA ) 25 MG tablet Dose change      Current Outpatient Medications:    eplerenone  (INSPRA ) 50 MG tablet, Take 1 tablet (50 mg total) by mouth daily., Disp: 90 tablet, Rfl: 3   amLODipine  (NORVASC ) 10 MG tablet, Take 1 tablet (10 mg total) by mouth daily., Disp: 90 tablet, Rfl: 2   aspirin 81 MG EC tablet, Take 1 tablet by mouth daily., Disp: , Rfl:    atorvastatin  (LIPITOR) 40 MG  tablet, Take 1 tablet (40 mg total) by mouth daily., Disp: 90 tablet, Rfl: 2   Cholecalciferol (VITAMIN D3) 25 MCG (1000 UT) CAPS, Take 1,000 Units by mouth daily., Disp: , Rfl:    clopidogrel  (PLAVIX ) 75 MG tablet, TAKE ONE TABLET BY MOUTH DAILY, Disp: 30 tablet, Rfl: 0   gentamicin  ointment (GARAMYCIN ) 0.1 %, Apply 1 Application topically 3 (three) times daily., Disp: 15 g, Rfl: 1   hydrALAZINE  (APRESOLINE ) 25 MG tablet, Take 1 tablet (25 mg total) by mouth 2 (two) times daily., Disp: 180 tablet, Rfl: 2   loratadine (CLARITIN) 10 MG tablet, Take 10 mg by mouth daily., Disp: , Rfl:    metFORMIN  (GLUCOPHAGE ) 500 MG tablet, TAKE ONE TABLET BY MOUTH EVERY DAY WITH breakfast, Disp: 90 tablet, Rfl: 2   metoprolol  succinate (TOPROL -XL) 25 MG 24 hr tablet, TAKE ONE TABLET BY MOUTH EVERY DAY ** Hold if FOR BLOOD PRESSURE is less THAN OR pulse less THAN 60 br=eats PER MINUTES, Disp: 90 tablet, Rfl: 1   MITIGARE  0.6 MG CAPS, Take 1 capsule by mouth 3 (three) times daily as needed., Disp: 30 capsule, Rfl: 2   Multiple Vitamin (ONE-DAILY MULTI VITAMINS PO), Take by mouth., Disp: , Rfl:    nitroGLYCERIN  (NITROSTAT ) 0.4 MG SL tablet, Place 1 tablet (0.4 mg total) under the tongue every 5 (five) minutes as needed for chest pain. If you require more than two tablets five  minutes apart go to the nearest ER via EMS., Disp: 30 tablet, Rfl: 0   sildenafil (REVATIO) 20 MG tablet, TAKE ONE TABLET BY MOUTH EVERY DAY AS NEEDED, Disp: 10 tablet, Rfl: 1   valsartan  (DIOVAN ) 160 MG tablet, Take 1 tablet (160 mg total) by mouth 2 (two) times daily., Disp: 180 tablet, Rfl: 1  Consent:   N/A  Disposition:   1 year follow-up sooner if needed  His questions and concerns were addressed to his satisfaction. He voices understanding of the recommendations provided during this encounter.    Signed, Madonna Michele HAS, [facility]  A Division of [facility] [address], [place],  [place] [address]   04/07/2024 5:50 PM

## 2024-04-07 NOTE — Patient Instructions (Signed)
 Medication Instructions:  INCREASE Eplerenone  (Inspra ) from 25 mg to 50 mg. Take one (1) tablet by mouth once daily in the morning.  *If you need a refill on your cardiac medications before your next appointment, please call your pharmacy*  Lab Work: BMP in 1 week If you have labs (blood work) drawn today and your tests are completely normal, you will receive your results only by: MyChart Message (if you have MyChart) OR A paper copy in the mail If you have any lab test that is abnormal or we need to change your treatment, we will call you to review the results.  Testing/Procedures: None ordered  Follow-Up: At [facility], you and your health needs are our priority.  As part of our continuing mission to provide you with exceptional heart care, our providers are all part of one team.  This team includes your primary Cardiologist (physician) and Advanced Practice Providers or APPs (Physician Assistants and Nurse Practitioners) who all work together to provide you with the care you need, when you need it.  Your next appointment:   1 year(s)  Provider:   Madonna Large, DO    We recommend signing up for the patient portal called MyChart.  Sign up information is provided on this After Visit Summary.  MyChart is used to connect with patients for Virtual Visits (Telemedicine).  Patients are able to view lab/test results, encounter notes, upcoming appointments, etc.  Non-urgent messages can be sent to your provider as well.   To learn more about what you can do with MyChart, go to [email or web address].

## 2024-04-13 ENCOUNTER — Telehealth: Payer: Self-pay

## 2024-04-13 NOTE — Telephone Encounter (Signed)
-----   Message from Garnette Shack, MD sent at 04/10/2024 11:59 AM EST ----- Regarding: FW: Pre-op Clearance Patient has been cleared.  Make sure that he knows to stay off his blood thinner for 3 days beforehand. ----- Message ----- From: Cecelia Harman SAUNDERS, CMA Sent: 04/10/2024   8:07 AM EST To: Garnette Shack, MD Subject: Pre-op Clearance                               Dr. Michele has signed a Pre-op Clearance letter from cardiology for patients biopsy and placed in chart. If there are any questions please reach out.

## 2024-04-13 NOTE — Telephone Encounter (Signed)
 LMOM asking for pt to return call. He will need to hold his Plavix  for 3 days prior to his biopsy on 04/26/24.

## 2024-04-14 NOTE — Telephone Encounter (Signed)
 Attempted to reach pt again, OK per DPR, [facility] letting patient know he will need to stop his plavix  and 81 mg ASA for 3 days prior to his biopsy on 04/26/24.

## 2024-04-17 ENCOUNTER — Encounter: Payer: Self-pay | Admitting: Podiatry

## 2024-04-17 ENCOUNTER — Ambulatory Visit: Admitting: Podiatry

## 2024-04-17 DIAGNOSIS — L84 Corns and callosities: Secondary | ICD-10-CM | POA: Diagnosis not present

## 2024-04-17 DIAGNOSIS — L988 Other specified disorders of the skin and subcutaneous tissue: Secondary | ICD-10-CM

## 2024-04-17 NOTE — Progress Notes (Unsigned)
 Thomas Rowland

## 2024-04-22 LAB — BASIC METABOLIC PANEL WITH GFR
BUN/Creatinine Ratio: 13 (ref 10–24)
BUN: 17 mg/dL (ref 8–27)
CO2: 23 mmol/L (ref 20–29)
Calcium: 9.2 mg/dL (ref 8.6–10.2)
Chloride: 102 mmol/L (ref 96–106)
Creatinine, Ser: 1.28 mg/dL — ABNORMAL HIGH (ref 0.76–1.27)
Glucose: 95 mg/dL (ref 70–99)
Potassium: 4.7 mmol/L (ref 3.5–5.2)
Sodium: 140 mmol/L (ref 134–144)
eGFR: 61 mL/min/1.73

## 2024-04-24 ENCOUNTER — Ambulatory Visit: Payer: Self-pay | Admitting: Cardiology

## 2024-04-25 NOTE — Procedures (Unsigned)
 TRANSRECTAL ULTRASOUND AND PROSTATE BIOPSY  Indication:  Elevated PSA  Prophylactic antibiotic administration: {TRUS antibiotics:26399}  All medications that could result in increased bleeding were discontinued within an appropriate period of the time of biopsy.  Risk including bleeding and infection were discussed.  Informed consent was obtained.  The patient was placed in the left lateral decubitus position.  PROCEDURE 1.  TRANSRECTAL ULTRASOUND OF THE PROSTATE  The 7 MHz transrectal probe was used to image the prostate.  Anal stenosis {WAS/WAS NOT:[phone]::was not} noted.  TRUS volume: *** ml  Hypoechoic areas: {prostate anatomy:26400}  Hyperechoic areas: {prostate anatomy:26400}  Central calcifications: {DESC; PRESENT/NOT PRESENT:21021351}  Margins:  {Normal/Abnormal Appearance:21344::normal}  Seminal Vesicles: {Normal/Abnormal Appearance:21344::normal}   PROCEDURE 2:  PROSTATE BIOPSY  A periprostatic block was performed using 1% lidocaine and transrectal ultrasound guidance. Under transrectal ultrasound guidance, and using the Biopty gun, prostate biopsies were obtained systematically from the apex, mid gland, and base bilaterally.  A total of *** cores were obtained.  Hemostasis was obtained with gentle pressure on the prostate.  The procedures were well-tolerated.  No significant bleeding was noted at the end of the procedure.  The patient was stable for discharge from the office.

## 2024-04-26 ENCOUNTER — Ambulatory Visit (HOSPITAL_BASED_OUTPATIENT_CLINIC_OR_DEPARTMENT_OTHER)
Admission: RE | Admit: 2024-04-26 | Discharge: 2024-04-26 | Disposition: A | Discharge status: Home / Self Care | Source: Ambulatory Visit | Attending: Urology | Admitting: Urology

## 2024-04-26 ENCOUNTER — Ambulatory Visit: Admitting: Urology

## 2024-04-26 VITALS — BP 154/83 | HR 67 | Temp 98.3°F | Wt 186.0 lb

## 2024-04-26 DIAGNOSIS — C61 Malignant neoplasm of prostate: Secondary | ICD-10-CM

## 2024-04-26 DIAGNOSIS — R972 Elevated prostate specific antigen [PSA]: Secondary | ICD-10-CM | POA: Insufficient documentation

## 2024-04-26 DIAGNOSIS — Z2989 Encounter for other specified prophylactic measures: Secondary | ICD-10-CM | POA: Diagnosis not present

## 2024-04-26 LAB — MICROSCOPIC EXAMINATION: Bacteria, UA: NONE SEEN

## 2024-04-26 LAB — URINALYSIS, ROUTINE W REFLEX MICROSCOPIC
Bilirubin, UA: NEGATIVE
Glucose, UA: NEGATIVE
Ketones, UA: NEGATIVE
Leukocytes,UA: NEGATIVE
Nitrite, UA: NEGATIVE
Specific Gravity, UA: 1.02 (ref 1.005–1.030)
Urobilinogen, Ur: 1 mg/dL (ref 0.2–1.0)
pH, UA: 6 (ref 5.0–7.5)

## 2024-04-26 MED ORDER — CEFTRIAXONE SODIUM 500 MG IJ SOLR
500.0000 mg | Freq: Once | INTRAMUSCULAR | Status: AC
  Administered 2024-04-26: 500 mg via INTRAMUSCULAR

## 2024-04-26 NOTE — Progress Notes (Signed)
 IM Injection  Patient is present today for an IM Injection for treatment of infection prevention post prostate biopsy Drug: Ceftriaxone  Dose:1g Location:right hip Lot: [id number] Exp:10/2025 Patient tolerated well, no complications were noted  Performed by: Korene Karlynn LATHER

## 2024-05-01 LAB — PROSTATE CORE NEEDLE BIOPSY

## 2024-05-02 ENCOUNTER — Encounter: Payer: Self-pay | Admitting: Urology

## 2024-05-08 NOTE — Progress Notes (Signed)
 Called @ 1235 on 1.12.2026--left VM

## 2024-05-10 NOTE — Progress Notes (Signed)
 I called patient's friend, Mrs. MALVA.  She is his spokesperson.  We will get him in for discussion about treatment

## 2024-05-11 ENCOUNTER — Encounter: Payer: Self-pay | Admitting: Family Medicine

## 2024-05-11 ENCOUNTER — Ambulatory Visit: Payer: Self-pay | Admitting: Family Medicine

## 2024-05-11 VITALS — BP 120/74 | HR 60 | Temp 99.5°F | Ht 64.0 in | Wt 188.0 lb

## 2024-05-11 DIAGNOSIS — I2510 Atherosclerotic heart disease of native coronary artery without angina pectoris: Secondary | ICD-10-CM

## 2024-05-11 DIAGNOSIS — E66811 Obesity, class 1: Secondary | ICD-10-CM

## 2024-05-11 DIAGNOSIS — Z6832 Body mass index (BMI) 32.0-32.9, adult: Secondary | ICD-10-CM

## 2024-05-11 DIAGNOSIS — E1165 Type 2 diabetes mellitus with hyperglycemia: Secondary | ICD-10-CM

## 2024-05-11 DIAGNOSIS — I119 Hypertensive heart disease without heart failure: Secondary | ICD-10-CM | POA: Diagnosis not present

## 2024-05-11 DIAGNOSIS — E6609 Other obesity due to excess calories: Secondary | ICD-10-CM

## 2024-05-11 DIAGNOSIS — Z1211 Encounter for screening for malignant neoplasm of colon: Secondary | ICD-10-CM

## 2024-05-11 MED ORDER — NITROGLYCERIN 0.4 MG SL SUBL: 0.4000 mg | SUBLINGUAL_TABLET | SUBLINGUAL | 0 refills | Status: AC | PRN

## 2024-05-11 NOTE — Patient Instructions (Signed)

## 2024-05-11 NOTE — Progress Notes (Signed)
 Thomas Rowland, CMA,acting as a neurosurgeon for [employer], NP.,have documented all relevant documentation on the behalf of Thomas Creighton, NP,as directed by  Thomas Creighton, NP while in the presence of Thomas Creighton, NP.  Subjective:  Patient ID: Thomas Rowland , male    DOB: 21-Oct-1955 , 69 y.o.   MRN: [id number]  Chief Complaint  Patient presents with   Diabetes    Patient presents today for a dm check. Patient reports compliance with his meds. Patient doesn't have any questions or concerns at this time.   Hypertension    HPI Discussed the use of AI scribe software for clinical note transcription with the patient, who gave verbal consent to proceed.  History of Present Illness      Thomas Rowland is a 69 year old male with hypertension and diabetes who presents for follow-up.  He takes his blood pressure medications regularly and has no chest pain. He last saw the cardiologist last month. He also visited a urologist for prostate evaluation on December 31st, 2025, and is unsure if a follow-up appointment has been scheduled. Patient is not a great historian, seems like his daughter helps him keep track of his appointments.  Regarding his diabetes, he takes his medication daily and last checked his blood sugar last month. He follows his diet and has lost weight, now weighing less than 190 pounds, down from nearly 200 pounds.  He has not had a colonoscopy since 2014 and is due for another one in 2024 but he prefers to test using cologuard. He last had his eyes checked last month.  He is unsure which vaccines he received this year, but mentions receiving various vaccines at his pharmacy at [facility], will verify.   Diabetes He presents for his follow-up diabetic visit. He has type 2 diabetes mellitus. There are no hypoglycemic associated symptoms. Pertinent negatives for hypoglycemia include no dizziness, headaches or nervousness/anxiousness. Pertinent negatives for diabetes include no  blurred vision, no chest pain, no fatigue, no polydipsia, no polyphagia and no polyuria. Symptoms are stable. Risk factors for coronary artery disease include dyslipidemia, hypertension, male sex and obesity.  Hypertension This is a chronic problem. The current episode started more than 1 year ago. The problem has been gradually worsening since onset. The problem is uncontrolled. Pertinent negatives include no anxiety, blurred vision, chest pain, headaches or palpitations. There are no associated agents to hypertension. Risk factors for coronary artery disease include obesity and diabetes mellitus. Past treatments include diuretics. The current treatment provides no improvement. There are no compliance problems.  There is no history of angina.     Past Medical History:  Diagnosis Date   Coronary artery disease    Diabetes mellitus without complication (HCC)    History of COVID-19    Hyperlipidemia    Hypertension    Nonspecific abnormal electrocardiogram (ECG) (EKG) 06/26/2013     Family History  Family history unknown: Yes    Current Medications[1]   Allergies[2]   Review of Systems  Constitutional:  Negative for fatigue.  Eyes: Negative.  Negative for blurred vision.  Respiratory: Negative.    Cardiovascular:  Negative for chest pain and palpitations.  Endocrine: Negative for polydipsia, polyphagia and polyuria.  Neurological:  Negative for dizziness and headaches.  Psychiatric/Behavioral: Negative.  Negative for behavioral problems and hallucinations. The patient is not nervous/anxious.      Today's Vitals   05/11/24 1603  BP: 120/74  Pulse: 60  Temp: 99.5 F (37.5 C)  TempSrc:  Oral  Weight: 188 lb (85.3 kg)  Height: 5' 4 (1.626 m)  PainSc: 0-No pain   Body mass index is 32.27 kg/m.  Wt Readings from Last 3 Encounters:  05/11/24 188 lb (85.3 kg)  04/26/24 186 lb (84.4 kg)  04/07/24 185 lb (83.9 kg)    The ASCVD Risk score (Arnett DK, et al., 2019) failed to  calculate for the following reasons:   The valid total cholesterol range is 130 to 320 mg/dL  Objective:  Physical Exam Constitutional:      Appearance: Normal appearance.  HENT:     Head: Normocephalic.  Cardiovascular:     Rate and Rhythm: Normal rate and regular rhythm.     Pulses: Normal pulses.     Heart sounds: Normal heart sounds.  Pulmonary:     Effort: Pulmonary effort is normal.     Breath sounds: Normal breath sounds.  Abdominal:     General: Bowel sounds are normal.  Skin:    General: Skin is warm and dry.  Neurological:     Mental Status: He is alert and oriented to person, place, and time. Mental status is at baseline.         Assessment And Plan:   Assessment & Plan Hypertensive heart disease without heart failure Blood pressure management ongoing, compliant with medications. - Continue current antihypertensive medications. Atherosclerosis of native coronary artery of native heart without angina pectoris Continue statin therapy Type 2 diabetes mellitus with hyperglycemia, without long-term current use of insulin (HCC) - Ordered A1c test. - Continue current diabetes medications. Class 1 obesity due to excess calories with serious comorbidity and body mass index (BMI) of 32.0 to 32.9 in adult Weight management ongoing, recent weight reduction. - Continue dietary modifications. Screening for colon cancer Cologuard ordered       Return for controlled DM check-4 months.  Patient was given opportunity to ask questions. Patient verbalized understanding of the plan and was able to repeat key elements of the plan. All questions were answered to their satisfaction.    I, Thomas Creighton, NP, have reviewed all documentation for this visit. The documentation on 05/22/2024 for the exam, diagnosis, procedures, and orders are all accurate and complete.    IF YOU HAVE BEEN REFERRED TO A SPECIALIST, IT MAY TAKE 1-2 WEEKS TO SCHEDULE/PROCESS THE REFERRAL. IF YOU HAVE NOT  HEARD FROM US /SPECIALIST IN TWO WEEKS, PLEASE GIVE US  A CALL AT [phone] X [phone].      [1]  Current Outpatient Medications:    aspirin 81 MG EC tablet, Take 1 tablet by mouth daily., Disp: , Rfl:    Cholecalciferol (VITAMIN D3) 25 MCG (1000 UT) CAPS, Take 1,000 Units by mouth daily., Disp: , Rfl:    clopidogrel  (PLAVIX ) 75 MG tablet, TAKE ONE TABLET BY MOUTH DAILY, Disp: 30 tablet, Rfl: 0   eplerenone  (INSPRA ) 50 MG tablet, Take 1 tablet (50 mg total) by mouth daily., Disp: 90 tablet, Rfl: 3   hydrALAZINE  (APRESOLINE ) 25 MG tablet, Take 1 tablet (25 mg total) by mouth 2 (two) times daily., Disp: 180 tablet, Rfl: 2   metoprolol  succinate (TOPROL -XL) 25 MG 24 hr tablet, TAKE ONE TABLET BY MOUTH EVERY DAY ** Hold if FOR BLOOD PRESSURE is less THAN OR pulse less THAN 60 br=eats PER MINUTES, Disp: 90 tablet, Rfl: 1   valsartan  (DIOVAN ) 160 MG tablet, Take 1 tablet (160 mg total) by mouth 2 (two) times daily., Disp: 180 tablet, Rfl: 1   amLODipine  (NORVASC ) 10 MG  tablet, Take 1 tablet (10 mg total) by mouth daily., Disp: 90 tablet, Rfl: 2   atorvastatin  (LIPITOR) 40 MG tablet, Take 1 tablet (40 mg total) by mouth daily., Disp: 90 tablet, Rfl: 2   gentamicin  ointment (GARAMYCIN ) 0.1 %, Apply 1 Application topically 3 (three) times daily., Disp: 15 g, Rfl: 1   loratadine (CLARITIN) 10 MG tablet, Take 10 mg by mouth daily., Disp: , Rfl:    metFORMIN  (GLUCOPHAGE ) 500 MG tablet, Take 1 tablet (500 mg total) by mouth daily with breakfast., Disp: 90 tablet, Rfl: 2   MITIGARE  0.6 MG CAPS, Take 1 capsule by mouth 3 (three) times daily as needed. (Patient not taking: Reported on 05/11/2024), Disp: 30 capsule, Rfl: 2   Multiple Vitamin (ONE-DAILY MULTI VITAMINS PO), Take by mouth., Disp: , Rfl:    nitroGLYCERIN  (NITROSTAT ) 0.4 MG SL tablet, Place 1 tablet (0.4 mg total) under the tongue every 5 (five) minutes as needed for chest pain. If you require more than two tablets five minutes apart go to the  nearest ER via EMS., Disp: 30 tablet, Rfl: 0   sildenafil (REVATIO) 20 MG tablet, TAKE ONE TABLET BY MOUTH EVERY DAY AS NEEDED (Patient not taking: Reported on 05/11/2024), Disp: 10 tablet, Rfl: 1 [2]  Allergies Allergen Reactions   Lisinopril Cough

## 2024-05-12 ENCOUNTER — Other Ambulatory Visit: Payer: Self-pay | Admitting: Podiatry

## 2024-05-12 LAB — CBC
Hematocrit: 41.9 % (ref 37.5–51.0)
Hemoglobin: 12.8 g/dL — ABNORMAL LOW (ref 13.0–17.7)
MCH: 26.6 pg (ref 26.6–33.0)
MCHC: 30.5 g/dL — ABNORMAL LOW (ref 31.5–35.7)
MCV: 87 fL (ref 79–97)
Platelets: 166 x10E3/uL (ref 150–450)
RBC: 4.82 x10E6/uL (ref 4.14–5.80)
RDW: 13.2 % (ref 11.6–15.4)
WBC: 8.1 x10E3/uL (ref 3.4–10.8)

## 2024-05-12 LAB — BASIC METABOLIC PANEL WITH GFR
BUN/Creatinine Ratio: 16 (ref 10–24)
BUN: 23 mg/dL (ref 8–27)
CO2: 22 mmol/L (ref 20–29)
Calcium: 9 mg/dL (ref 8.6–10.2)
Chloride: 102 mmol/L (ref 96–106)
Creatinine, Ser: 1.4 mg/dL — ABNORMAL HIGH (ref 0.76–1.27)
Glucose: 95 mg/dL (ref 70–99)
Potassium: 4.7 mmol/L (ref 3.5–5.2)
Sodium: 139 mmol/L (ref 134–144)
eGFR: 54 mL/min/1.73 — ABNORMAL LOW

## 2024-05-12 LAB — LIPID PANEL
Chol/HDL Ratio: 2.7 ratio (ref 0.0–5.0)
Cholesterol, Total: 129 mg/dL (ref 100–199)
HDL: 48 mg/dL
LDL Chol Calc (NIH): 66 mg/dL (ref 0–99)
Triglycerides: 73 mg/dL (ref 0–149)
VLDL Cholesterol Cal: 15 mg/dL (ref 5–40)

## 2024-05-12 LAB — HEMOGLOBIN A1C
Est. average glucose Bld gHb Est-mCnc: 123 mg/dL
Hgb A1c MFr Bld: 5.9 % — ABNORMAL HIGH (ref 4.8–5.6)

## 2024-05-23 ENCOUNTER — Ambulatory Visit: Payer: Self-pay | Admitting: Family Medicine

## 2024-05-23 MED ORDER — METFORMIN HCL 500 MG PO TABS: 500.0000 mg | ORAL_TABLET | Freq: Every day | ORAL | 2 refills | Status: AC

## 2024-05-23 MED ORDER — ATORVASTATIN CALCIUM 40 MG PO TABS: 40.0000 mg | ORAL_TABLET | Freq: Every day | ORAL | 2 refills | Status: AC

## 2024-05-23 MED ORDER — AMLODIPINE BESYLATE 10 MG PO TABS: 10.0000 mg | ORAL_TABLET | Freq: Every day | ORAL | 2 refills | Status: AC

## 2024-05-23 NOTE — Assessment & Plan Note (Addendum)
-   Ordered A1c test. - Continue current diabetes medications.

## 2024-05-23 NOTE — Assessment & Plan Note (Addendum)
 Continue statin therapy

## 2024-05-23 NOTE — Progress Notes (Signed)
 Your kidney function seem like it dropped from your previous blood draw, we will keep an eye until your next visit but make sure you are taking all your medications and checking your BP. Your A1c is very good at 5.9 and your cholesterol is very good also.   Thanks!

## 2024-05-23 NOTE — Assessment & Plan Note (Addendum)
 Cologuard ordered

## 2024-05-23 NOTE — Assessment & Plan Note (Addendum)
 Blood pressure management ongoing, compliant with medications. - Continue current antihypertensive medications.

## 2024-05-31 ENCOUNTER — Ambulatory Visit: Admitting: Urology

## 2024-05-31 ENCOUNTER — Encounter: Payer: Self-pay | Admitting: Urology

## 2024-05-31 VITALS — BP 149/87 | HR 77 | Ht 64.0 in | Wt 188.0 lb

## 2024-05-31 DIAGNOSIS — C61 Malignant neoplasm of prostate: Secondary | ICD-10-CM

## 2024-05-31 DIAGNOSIS — R972 Elevated prostate specific antigen [PSA]: Secondary | ICD-10-CM

## 2024-06-01 ENCOUNTER — Telehealth: Payer: Self-pay | Admitting: Radiation Oncology

## 2024-06-01 NOTE — Telephone Encounter (Signed)
 2/5 @ 12:39 pm Left voicemail for patient to call our office to be sch for consult.

## 2024-06-02 ENCOUNTER — Encounter: Payer: Self-pay | Admitting: Family Medicine

## 2024-06-02 ENCOUNTER — Other Ambulatory Visit: Payer: Self-pay | Admitting: Family Medicine

## 2024-06-02 ENCOUNTER — Telehealth: Payer: Self-pay | Admitting: Radiation Oncology

## 2024-06-02 NOTE — Telephone Encounter (Signed)
 2/6 @ 12:22 pm Called both patient's contact numbers, left voicemail for patient to call our office to be sch for consult.

## 2024-09-14 ENCOUNTER — Ambulatory Visit: Payer: Self-pay | Admitting: Family Medicine

## 2024-11-15 ENCOUNTER — Ambulatory Visit: Payer: Self-pay

## 2025-01-02 ENCOUNTER — Encounter: Payer: Self-pay | Admitting: Internal Medicine
